# Patient Record
Sex: Female | Born: 1940 | Race: Black or African American | Hispanic: No | Smoking: Never smoker
Health system: Southern US, Community
[De-identification: ages and names within clinical notes are randomized; demographics above are authoritative.]

## PROBLEM LIST (undated history)

## (undated) DIAGNOSIS — N179 Acute kidney failure, unspecified: Secondary | ICD-10-CM

## (undated) DIAGNOSIS — R079 Chest pain, unspecified: Secondary | ICD-10-CM

## (undated) DIAGNOSIS — I10 Essential (primary) hypertension: Secondary | ICD-10-CM

## (undated) DIAGNOSIS — M1990 Unspecified osteoarthritis, unspecified site: Secondary | ICD-10-CM

## (undated) DIAGNOSIS — K5792 Diverticulitis of intestine, part unspecified, without perforation or abscess without bleeding: Secondary | ICD-10-CM

## (undated) DIAGNOSIS — E785 Hyperlipidemia, unspecified: Secondary | ICD-10-CM

## (undated) DIAGNOSIS — K8050 Calculus of bile duct without cholangitis or cholecystitis without obstruction: Secondary | ICD-10-CM

## (undated) HISTORY — DX: Diverticulitis of intestine, part unspecified, without perforation or abscess without bleeding: K57.92

## (undated) HISTORY — DX: Acute kidney failure, unspecified: N17.9

## (undated) HISTORY — PX: HERNIA REPAIR: SHX51

## (undated) HISTORY — PX: TUBAL LIGATION: SHX77

## (undated) HISTORY — DX: Calculus of bile duct without cholangitis or cholecystitis without obstruction: K80.50

## (undated) HISTORY — DX: Unspecified osteoarthritis, unspecified site: M19.90

## (undated) HISTORY — DX: Hyperlipidemia, unspecified: E78.5

---

## 2002-11-17 ENCOUNTER — Ambulatory Visit (HOSPITAL_COMMUNITY): Admission: RE | Admit: 2002-11-17 | Discharge: 2002-11-17 | Payer: Self-pay | Admitting: Family Medicine

## 2002-12-13 ENCOUNTER — Ambulatory Visit (HOSPITAL_COMMUNITY): Admission: RE | Admit: 2002-12-13 | Discharge: 2002-12-13 | Payer: Self-pay | Admitting: Internal Medicine

## 2002-12-13 ENCOUNTER — Encounter: Payer: Self-pay | Admitting: Internal Medicine

## 2003-05-13 ENCOUNTER — Encounter: Payer: Self-pay | Admitting: Emergency Medicine

## 2003-05-13 ENCOUNTER — Emergency Department (HOSPITAL_COMMUNITY): Admission: EM | Admit: 2003-05-13 | Discharge: 2003-05-13 | Payer: Self-pay | Admitting: Emergency Medicine

## 2004-01-23 ENCOUNTER — Emergency Department (HOSPITAL_COMMUNITY): Admission: EM | Admit: 2004-01-23 | Discharge: 2004-01-23 | Payer: Self-pay | Admitting: Emergency Medicine

## 2007-06-27 ENCOUNTER — Ambulatory Visit (HOSPITAL_COMMUNITY): Admission: RE | Admit: 2007-06-27 | Discharge: 2007-06-27 | Payer: Self-pay | Admitting: Family Medicine

## 2009-04-30 ENCOUNTER — Encounter: Admission: RE | Admit: 2009-04-30 | Discharge: 2009-04-30 | Payer: Self-pay | Admitting: Family Medicine

## 2010-11-16 ENCOUNTER — Emergency Department (HOSPITAL_COMMUNITY)
Admission: EM | Admit: 2010-11-16 | Discharge: 2010-11-16 | Payer: Self-pay | Source: Home / Self Care | Admitting: Emergency Medicine

## 2010-11-16 ENCOUNTER — Encounter (INDEPENDENT_AMBULATORY_CARE_PROVIDER_SITE_OTHER): Payer: Self-pay | Admitting: *Deleted

## 2010-11-18 ENCOUNTER — Encounter (INDEPENDENT_AMBULATORY_CARE_PROVIDER_SITE_OTHER): Payer: Self-pay | Admitting: *Deleted

## 2010-11-18 ENCOUNTER — Telehealth: Payer: Self-pay | Admitting: Gastroenterology

## 2010-11-19 ENCOUNTER — Ambulatory Visit
Admission: RE | Admit: 2010-11-19 | Discharge: 2010-11-19 | Payer: Self-pay | Source: Home / Self Care | Attending: Gastroenterology | Admitting: Gastroenterology

## 2010-12-18 NOTE — Assessment & Plan Note (Signed)
History of Present Illness Visit Type: new patient  Primary GI MD: Rob Bunting MD Primary Provider: Altamese Branford, MD  Requesting Provider: na Chief Complaint: Upper abd pain  History of Present Illness:     very pleasant 70 year old woman who is here with her daughter today. was referred by ER, with tremendous pains in her stomach.  She really does not recall it very well.  She was told she was constipated. She drank the mag citrate they recommended with good results.  She felt better.    She began vomitting 2 days prior to ER visit.  This lasted about 2 days.  The pain started started after the vomiting.  The vomiting went away.  She has not been eating well.  the emergency room evaluation included CT scan, lab tests, urinalysis. She had a mildly elevated white blood cell count but otherwise her labs were all normal. And suggested fluid in her esophagus and a large amount of stool in her right colon as well as some stool in her distal small bowel. She was nonobstructed.  No fevers or chills with this.    Currently she doesn't feel "well" but pain is gone.  Her last oral pain med was yesterday.    she had a colonoscopy with Dr. Loreta Ave 3 years ago.  She thinks polyps were removed.  Her daughter tells me had constipation at that time.  she does have a BM everyday, but smaller.  no sick contacts.  She            Current Medications (verified): 1)  Hydrocodone-Acetaminophen 5-325 Mg Tabs (Hydrocodone-Acetaminophen) .... One By Mouth Every Six Hours As Needed For Pain 2)  Zofran 4 Mg Tabs (Ondansetron Hcl) .... As Needed 3)  Prilosec Otc 20 Mg Tbec (Omeprazole Magnesium) .... One Tablet By Mouth Once Daily 4)  Calcium 500 Mg Tabs (Calcium) .... One Tablet By By Mouth Two Times A Day 5)  Fish Oil 1000 Mg Caps (Omega-3 Fatty Acids) .... One By Mouth Two Times A Day  Allergies (verified): 1)  ! Codeine  Past History:  Past Medical History: arthritis Colon polyps, Dr. Loreta Ave  2009  Past Surgical History: none  Family History: mother had colon cancer  Social History: she is widowed, she has 2 children, she works as a Arboriculturist, she does not smoke cigarettes or drink alcohol  Review of Systems       Pertinent positive and negative review of systems were noted in the above HPI and GI specific review of systems.  All other review of systems was otherwise negative.   Vital Signs:  Patient profile:   70 year old female Height:      62 inches Weight:      148 pounds BMI:     27.17 BSA:     1.68 Pulse rate:   64 / minute Pulse rhythm:   regular BP sitting:   120 / 74  (left arm) Cuff size:   regular  Vitals Entered By: Ok Anis CMA (November 19, 2010 9:58 AM)  Physical Exam  Additional Exam:  Constitutional: generally well appearing Psychiatric: alert and oriented times 3 Eyes: extraocular movements intact Mouth: oropharynx moist, no lesions Neck: supple, no lymphadenopathy Cardiovascular: heart regular rate and rythm Lungs: CTA bilaterally Abdomen: soft, non-tender, non-distended, no obvious ascites, no peritoneal signs, normal bowel sounds Extremities: no lower extremity edema bilaterally Skin: no lesions on visible extremities    Impression & Recommendations:  Problem # 1:  Symptomatic constipation she had a very large amount stool in her colon on the CT scan that was done 2-3 days ago. She also had fluid back up in her esophagus around the time of a vomiting illness. Still me today that she has absolutely no dysphasia as I don't think that there is truly esophageal pathology here. I recommended she start taking MiraLax on a daily basis to try to prevent significant constipation the future. She knows to call here if she has further troubles. She has been seen by another gastroenterologist to 3 years ago and we will get those records sent over for review so that we may put her in proper surveillance recall.  Patient Instructions: 1)  We will  get colonoscopy reports sent over from Dr. Kenna Gilbert office, pathology as well. Will put you in our reminder system here for your next colonoscopy. 2)  Start miralax powder (OTC), one dose every day. 3)  You need to call Dr. Christella Hartigan' office if the pains return. 4)  The medication list was reviewed and reconciled.  All changed / newly prescribed medications were explained.  A complete medication list was provided to the patient / caregiver.

## 2010-12-18 NOTE — Progress Notes (Signed)
Summary: Triage  Phone Note Call from Patient Call back at [phone]   Caller: Patients daughter Call For: Dr. Christella Hartigan Reason for Call: Talk to Nurse Summary of Call: Pts daughter is calling to get patient an appt, she was seen in ER last night for severe abdominal pain and was told to see a GI for an EGD or Colon, scheduled first available NP3 with Christella Hartigan in feb. but daughter also wants to speak with a nurse Initial call taken by: [place] Johnson,  November 18, 2010 8:19 AM  Follow-up for Phone Call        left message on machine to call back Chales Abrahams CMA Duncan Dull)  November 18, 2010 9:06 AM   Additional Follow-up for Phone Call Additional follow up Details #1::        pt daughter returned call and was given appt with Dr Christella Hartigan on 11/19/10. Additional Follow-up by: Chales Abrahams CMA (AAMA),  November 18, 2010 1:14 PM

## 2011-01-26 LAB — URINALYSIS, ROUTINE W REFLEX MICROSCOPIC
Nitrite: NEGATIVE
Specific Gravity, Urine: 1.02 (ref 1.005–1.030)
pH: 8 (ref 5.0–8.0)

## 2011-01-26 LAB — CBC
Hemoglobin: 12 g/dL (ref 12.0–15.0)
RBC: 4.38 MIL/uL (ref 3.87–5.11)

## 2011-01-26 LAB — COMPREHENSIVE METABOLIC PANEL
ALT: 20 U/L (ref 0–35)
AST: 30 U/L (ref 0–37)
CO2: 27 mEq/L (ref 19–32)
Calcium: 9.3 mg/dL (ref 8.4–10.5)
Chloride: 101 mEq/L (ref 96–112)
GFR calc Af Amer: >60 mL/min (ref >60)
GFR calc non Af Amer: >60 mL/min (ref >60)
Sodium: 139 mEq/L (ref 135–145)
Total Bilirubin: 0.5 mg/dL (ref 0.3–1.2)

## 2011-01-26 LAB — DIFFERENTIAL
Eosinophils Absolute: 0 10*3/uL (ref 0.0–0.7)
Eosinophils Relative: 0 % (ref 0–5)
Lymphs Abs: 1.1 10*3/uL (ref 0.7–4.0)
Monocytes Absolute: 0.6 10*3/uL (ref 0.1–1.0)

## 2011-01-26 LAB — LIPASE, BLOOD: Lipase: 21 U/L (ref 11–59)

## 2011-08-17 HISTORY — PX: VENTRAL HERNIA REPAIR: SHX424

## 2011-08-17 HISTORY — PX: CHOLECYSTECTOMY: SHX55

## 2011-09-03 ENCOUNTER — Inpatient Hospital Stay (HOSPITAL_COMMUNITY)
Admission: EM | Admit: 2011-09-03 | Discharge: 2011-09-12 | DRG: 493 | Disposition: A | Discharge status: Home / Self Care | Payer: BC Managed Care – PPO | Attending: General Surgery | Admitting: General Surgery

## 2011-09-03 ENCOUNTER — Emergency Department (HOSPITAL_COMMUNITY): Payer: BC Managed Care – PPO

## 2011-09-03 DIAGNOSIS — IMO0002 Reserved for concepts with insufficient information to code with codable children: Secondary | ICD-10-CM | POA: Diagnosis not present

## 2011-09-03 DIAGNOSIS — K420 Umbilical hernia with obstruction, without gangrene: Secondary | ICD-10-CM | POA: Diagnosis not present

## 2011-09-03 DIAGNOSIS — N390 Urinary tract infection, site not specified: Secondary | ICD-10-CM | POA: Diagnosis present

## 2011-09-03 DIAGNOSIS — K810 Acute cholecystitis: Principal | ICD-10-CM | POA: Diagnosis present

## 2011-09-03 DIAGNOSIS — K811 Chronic cholecystitis: Secondary | ICD-10-CM

## 2011-09-03 DIAGNOSIS — R112 Nausea with vomiting, unspecified: Secondary | ICD-10-CM | POA: Diagnosis not present

## 2011-09-03 DIAGNOSIS — Y836 Removal of other organ (partial) (total) as the cause of abnormal reaction of the patient, or of later complication, without mention of misadventure at the time of the procedure: Secondary | ICD-10-CM | POA: Diagnosis not present

## 2011-09-03 LAB — URINE MICROSCOPIC-ADD ON

## 2011-09-03 LAB — COMPREHENSIVE METABOLIC PANEL
AST: 24 U/L (ref 0–37)
Albumin: 3.8 g/dL (ref 3.5–5.2)
Alkaline Phosphatase: 106 U/L (ref 39–117)
Chloride: 98 mEq/L (ref 96–112)
Potassium: 3.7 mEq/L (ref 3.5–5.1)
Sodium: 135 mEq/L (ref 135–145)
Total Bilirubin: 0.3 mg/dL (ref 0.3–1.2)
Total Protein: 8 g/dL (ref 6.0–8.3)

## 2011-09-03 LAB — DIFFERENTIAL
Basophils Absolute: 0 10*3/uL (ref 0.0–0.1)
Basophils Relative: 0 % (ref 0–1)
Eosinophils Absolute: 0 10*3/uL (ref 0.0–0.7)
Eosinophils Relative: 0 % (ref 0–5)
Lymphocytes Relative: 8 % — ABNORMAL LOW (ref 12–46)
Monocytes Absolute: 0.7 10*3/uL (ref 0.1–1.0)

## 2011-09-03 LAB — LIPASE, BLOOD: Lipase: 46 U/L (ref 11–59)

## 2011-09-03 LAB — CBC
HCT: 40 % (ref 36.0–46.0)
Hemoglobin: 13.5 g/dL (ref 12.0–15.0)
MCH: 27.4 pg (ref 26.0–34.0)
MCHC: 33.8 g/dL (ref 30.0–36.0)
MCV: 81.3 fL (ref 78.0–100.0)
Platelets: 330 K/uL (ref 150–400)
RBC: 4.92 MIL/uL (ref 3.87–5.11)
RDW: 15.4 % (ref 11.5–15.5)
WBC: 13.4 K/uL — ABNORMAL HIGH (ref 4.0–10.5)

## 2011-09-03 LAB — URINALYSIS, ROUTINE W REFLEX MICROSCOPIC
Glucose, UA: NEGATIVE mg/dL
Hgb urine dipstick: NEGATIVE
Protein, ur: NEGATIVE mg/dL
pH: 8 (ref 5.0–8.0)

## 2011-09-03 LAB — SURGICAL PCR SCREEN: MRSA, PCR: NEGATIVE

## 2011-09-03 MED ORDER — IOHEXOL 300 MG/ML  SOLN
100.0000 mL | Freq: Once | INTRAMUSCULAR | Status: AC | PRN
  Administered 2011-09-03: 100 mL via INTRAVENOUS

## 2011-09-04 ENCOUNTER — Other Ambulatory Visit (INDEPENDENT_AMBULATORY_CARE_PROVIDER_SITE_OTHER): Payer: Self-pay | Admitting: Surgery

## 2011-09-04 LAB — URINE CULTURE: Colony Count: 65000

## 2011-09-04 LAB — COMPREHENSIVE METABOLIC PANEL
ALT: 15 U/L (ref 0–35)
AST: 13 U/L (ref 0–37)
CO2: 25 mEq/L (ref 19–32)
Calcium: 8.7 mg/dL (ref 8.4–10.5)
Chloride: 105 mEq/L (ref 96–112)
GFR calc Af Amer: >90 mL/min (ref >90)
GFR calc non Af Amer: >90 mL/min (ref >90)
Glucose, Bld: 113 mg/dL — ABNORMAL HIGH (ref 70–99)
Sodium: 137 mEq/L (ref 135–145)
Total Bilirubin: 0.2 mg/dL — ABNORMAL LOW (ref 0.3–1.2)

## 2011-09-04 LAB — CBC
Hemoglobin: 10.8 g/dL — ABNORMAL LOW (ref 12.0–15.0)
MCH: 27.2 pg (ref 26.0–34.0)
MCV: 81.6 fL (ref 78.0–100.0)
Platelets: 265 10*3/uL (ref 150–400)
RBC: 3.97 MIL/uL (ref 3.87–5.11)
WBC: 8.8 10*3/uL (ref 4.0–10.5)

## 2011-09-05 LAB — CBC
MCH: 27.3 pg (ref 26.0–34.0)
MCHC: 32.7 g/dL (ref 30.0–36.0)
Platelets: 323 10*3/uL (ref 150–400)

## 2011-09-05 LAB — COMPREHENSIVE METABOLIC PANEL
ALT: 24 U/L (ref 0–35)
AST: 28 U/L (ref 0–37)
Calcium: 10.1 mg/dL (ref 8.4–10.5)
GFR calc Af Amer: >90 mL/min (ref >90)
Sodium: 140 mEq/L (ref 135–145)
Total Protein: 7.5 g/dL (ref 6.0–8.3)

## 2011-09-05 NOTE — Op Note (Signed)
NAMEDARSI, TIEN NO.:  [id number]  MEDICAL RECORD NO.:  [id number]  LOCATION:  1535                         FACILITY:  [facility]  PHYSICIAN:  Wilmon Arms. Corliss Skains, M.D. DATE OF BIRTH:  Dec 15, 1940  DATE OF PROCEDURE:  09/04/2011 DATE OF DISCHARGE:                              OPERATIVE REPORT   PREOPERATIVE DIAGNOSIS:  Acute cholecystitis.  POSTOPERATIVE DIAGNOSIS:  Acute cholecystitis.  PROCEDURE:  Laparoscopic cholecystectomy.  SURGEON:  Wilmon Arms. Corliss Skains, MD  ANESTHESIA:  General.  INDICATIONS:  This is a 70 year old female in good health, who presents with several days of abdominal pain, nausea, and vomiting.  She was admitted to the hospital after being found to have a thickened gallbladder containing stones.  Her liver function tests were normal. She presents now for cholecystectomy.  DESCRIPTION OF PROCEDURE:  The patient was brought to the operating room and placed in the supine position on the operating room table.  After an adequate level of general anesthesia was obtained, the patient's abdomen was prepped with ChloraPrep and draped in sterile fashion.  Time-out was taken to ensure the proper patient, proper procedure.  We infiltrated the area below the umbilicus with 0.25% Marcaine with epinephrine.  A transverse incision was made.  Dissection was carried down the fascia. We opened the fascia vertically.  We entered the peritoneal cavity bluntly.  A stay suture of 0 Vicryl was placed around the fascial opening.  The Hasson cannula was inserted and secured to stay suture. Pneumoperitoneum was obtained by insufflating CO2, maintaining a maximal pressure of 15 mmHg.  The laparoscope was inserted and the patient was positioned in reverse Trendelenburg, tilted to her left.  An 11-mm port was placed in the subxiphoid position.  Two 5-mm ports were placed in the right upper quadrant.  The gallbladder was very thickened and edematous.  We were able to  grasp this with a clamp and lift it.  We opened the peritoneum around the hilum of gallbladder.  We dissected around the cystic artery and cystic duct.  We were able to obtain a critical view.  The cystic artery was ligated, clipped, and divided. The cystic duct was ligated and clipped distally.  A small opening was created on the cystic duct.  We inserted a Cook cholangiogram catheter through the stab incision.  We tried to thread this into the cystic duct.  However, as we tried to irrigate the cholangiogram catheter with saline, there was a persistent leak.  We tried to milk any type of distal obstruction or stone out of the cystic duct, but despite repeated attempts, we were unable to obtain adequate cholangiogram.  The cholangiogram catheter was removed.  We ligated the cystic duct with a clip as well as Endoloop.  No bleeding or leak was noted.  We then dissected the gallbladder free from the liver.  There was a little bit of spillage of bile, but no stones were noted.  We placed the gallbladder in an Endocatch sac.  We thoroughly irrigated the gallbladder fossa and inspected carefully for hemostasis.  The gallbladder in Endocatch sac was removed through the umbilical port site.  We then inspected it again for hemostasis.  A 19-French  Blake drain was inserted through the subxiphoid position and brought out through the most lateral port in the right upper quadrant.  This was sutured to the skin with 2-0 nylon.  We placed a drain in the subhepatic space in the gallbladder fossa.  This was placed to bulb suction. Pneumoperitoneum was then released as we removed the trocars.  Monocryl 4-0 was used to close the skin incisions.  The pursestring sutures were used to close the umbilical fascia.  Benzoin and Steri-Strips were applied. The patient was extubated and brought to recovery room in stable condition.  All sponge, instrument, needle counts were correct.     Wilmon Arms. Corliss Skains,  M.D.     MKT/MEDQ  D:  09/04/2011  T:  09/04/2011  Job:  [id number]  Electronically Signed by Manus Rudd M.D. on 09/05/2011 03:30:32 PM

## 2011-09-06 ENCOUNTER — Inpatient Hospital Stay (HOSPITAL_COMMUNITY): Payer: BC Managed Care – PPO

## 2011-09-07 ENCOUNTER — Inpatient Hospital Stay (HOSPITAL_COMMUNITY): Payer: BC Managed Care – PPO

## 2011-09-07 DIAGNOSIS — K430 Incisional hernia with obstruction, without gangrene: Secondary | ICD-10-CM

## 2011-09-07 LAB — BASIC METABOLIC PANEL
BUN: 4 mg/dL — ABNORMAL LOW (ref 6–23)
CO2: 27 mEq/L (ref 19–32)
Calcium: 9.1 mg/dL (ref 8.4–10.5)
Chloride: 97 mEq/L (ref 96–112)
Creatinine, Ser: 0.49 mg/dL — ABNORMAL LOW (ref 0.50–1.10)
GFR calc Af Amer: >90 mL/min (ref >90)
GFR calc non Af Amer: >90 mL/min (ref >90)
Glucose, Bld: 121 mg/dL — ABNORMAL HIGH (ref 70–99)
Potassium: 3.9 mEq/L (ref 3.5–5.1)
Sodium: 131 mEq/L — ABNORMAL LOW (ref 135–145)

## 2011-09-07 LAB — CBC
HCT: 37 % (ref 36.0–46.0)
Hemoglobin: 12.1 g/dL (ref 12.0–15.0)
MCH: 26.9 pg (ref 26.0–34.0)
MCHC: 32.7 g/dL (ref 30.0–36.0)

## 2011-09-07 MED ORDER — IOHEXOL 300 MG/ML  SOLN
100.0000 mL | Freq: Once | INTRAMUSCULAR | Status: AC | PRN
  Administered 2011-09-07: 100 mL via INTRAVENOUS

## 2011-09-08 LAB — URINALYSIS, MICROSCOPIC ONLY
Glucose, UA: NEGATIVE mg/dL
Leukocytes, UA: NEGATIVE
Specific Gravity, Urine: 1.01 (ref 1.005–1.030)
Urobilinogen, UA: 0.2 mg/dL (ref 0.0–1.0)

## 2011-09-08 LAB — CBC
HCT: 34.5 % — ABNORMAL LOW (ref 36.0–46.0)
Hemoglobin: 12.2 g/dL (ref 12.0–15.0)
Hemoglobin: 11.5 g/dL — ABNORMAL LOW (ref 12.0–15.0)
MCH: 27.2 pg (ref 26.0–34.0)
MCHC: 33.6 g/dL (ref 30.0–36.0)
MCV: 81 fL (ref 78.0–100.0)
WBC: 9.3 10*3/uL (ref 4.0–10.5)

## 2011-09-08 LAB — DIFFERENTIAL
Basophils Absolute: 0 10*3/uL (ref 0.0–0.1)
Basophils Relative: 0 % (ref 0–1)
Lymphocytes Relative: 6 % — ABNORMAL LOW (ref 12–46)
Neutro Abs: 8.6 10*3/uL — ABNORMAL HIGH (ref 1.7–7.7)
Neutrophils Relative %: 93 % — ABNORMAL HIGH (ref 43–77)

## 2011-09-08 LAB — COMPREHENSIVE METABOLIC PANEL
BUN: 4 mg/dL — ABNORMAL LOW (ref 6–23)
Calcium: 9 mg/dL (ref 8.4–10.5)
Creatinine, Ser: 0.47 mg/dL — ABNORMAL LOW (ref 0.50–1.10)
GFR calc Af Amer: >90 mL/min (ref >90)
Glucose, Bld: 147 mg/dL — ABNORMAL HIGH (ref 70–99)
Sodium: 134 mEq/L — ABNORMAL LOW (ref 135–145)
Total Protein: 6.5 g/dL (ref 6.0–8.3)

## 2011-09-08 LAB — MAGNESIUM: Magnesium: 1.8 mg/dL (ref 1.5–2.5)

## 2011-09-09 NOTE — H&P (Signed)
NAMEHANNIE, Teresa Keller NO.:  [id number]  MEDICAL RECORD NO.:  [id number]  LOCATION:  WLED                         FACILITY:  [facility]  PHYSICIAN:  Wilmon Arms. Corliss Skains, M.D. DATE OF BIRTH:  Jul 28, 1941  DATE OF ADMISSION:  09/03/2011 DATE OF DISCHARGE:                             HISTORY & PHYSICAL   CHIEF COMPLAINT:  Abdominal pain since Sunday.  PRIMARY CARE PHYSICIAN:  Cephas Darby. Daphine Deutscher, MD  The patient reports she has not seen Dr. Daphine Deutscher in years.  BRIEF HISTORY:  The patient is a 70 year old [place] American female, who still works full time.  She reports abdominal pain since Sunday along with nausea and vomiting.  The pain is in the mid abdomen.  It is not necessarily related to oral intake.  It lasted all day Sunday and into the afternoon on Monday, then she started feeling better.  Tuesday, she did fine throughout the day.  The pain returned on Wednesday.  She had problem with recurrent nausea and vomiting.  She says her belly sore from that.  She also reports she really has not eaten.  She has been drinking some soups and liquids, but has not had anything solid to eat since Monday.  The patient reports she had similar pain back in January, which they attributed to constipation.  She has been placed on MiraLax and that problem has resolved.  Her last bowel movement was Tuesday.  She continues to have pain since yesterday and the pain has only been relieved by analgesics here in the emergency room.  PAST MEDICAL HISTORY:  No medical problems.  She does have some arthritis.  PAST SURGICAL HISTORY:  Tubal ligation.  FAMILY HISTORY:  Mother is living at 61 with a history of colon cancer. Father died in his 42s lately of an MI.  Two brothers with hypertension, one sister with hypertension.  SOCIAL HISTORY:  She is widowed.  She works as a Arboriculturist at [employer].  Tobacco none.  Alcohol, none.  Drugs none.  REVIEW OF SYSTEMS:  FEVER:   None.  SKIN:  No changes.  WEIGHT:  Stable, no changes.  CARDIAC:  No chest pain or palpitations.  PULMONARY:  No orthopnea, PND, or dyspnea on exertion.  GI:  Positive for bowel movement, Tuesday.  She normally does not have GERD symptoms.  No diarrhea or constipation, currently on MiraLax.  No blood in her stool. GU:  No trouble voiding.  LOWER EXTREMITIES:  No edema.  No claudication.  She has some problems with arthritis in her knees. PSYCHIATRIC:  No changes.  CURRENT MEDICATIONS: 1. MiraLax 17 g 3 days a week on and 3 days off. 2. Vitamin D plus calcium supplement 1 daily. 3. Fish oil 1 capsule daily.  ALLERGIES:  CODEINE causes nausea.  PHYSICAL EXAMINATION:  GENERAL:  This is a well-nourished and well- developed African American female in no acute distress.  She is currently complaining of pain again and she points to the mid epigastric area. VITAL SIGNS:  Temperature is 98.9, heart rate is 77, blood pressure is 179/72 on admission, her last recorded blood pressure at 8 was 143/74. Respiratory rate on admission is 22 and  saturation 99% on room air. HEAD:  Normocephalic. EARS, NOSE, THROAT, AND MOUTH:  Within normal limits. NECK:  Trachea is in the midline.  There are no bruits or JVD.  Thyroid is nonpalpable. LUNGS:  Respiratory effort is normal.  Chest is clear to auscultation and percussion. CARDIAC:  Normal S1 and S2.  No murmur or rub was heard.  No lifts or heaves.  Pulses are +2 in upper and lower extremities. ABDOMEN:  Bowel sounds are present.  She is not really distended.  She again points to mid epigastric area.  She is slightly tender and she points to the whole upper abdomen, but there is no acute tenderness either the right or left side of upper or lower quadrants. GU/RECTAL:  Deferred. LYMPHATIC:  Lymphadenopathy, none palpated, axillary, cervical, or femoral. NEUROLOGIC:  No focal deficits.  Cranial nerves II through XII are intact. PSYCHIATRIC:  Normal  affect.  LABORATORY DATA:  Lipase is 46, sodium is 135, potassium is 3.7, chloride is 98, CO2 is 26, BUN is 8, creatinine is 0.54, glucose is 114, total bilirubin 0.3, alkaline phosphatase 106, SGOT is 24, and SGPT is 24.  White count is 13.4, hemoglobin is 13.5, hematocrit is 40, and platelets are 330,000.  UA shows 11 to 20 white cells per high-power field.  Urine was cloudy with many bacteria.  Abdominal ultrasound shows less than 1 cm gallstones seen in the neck of the gallbladder.  There is diffuse gallbladder wall thickening with minimal pericholecystic fluid consistent with cholecystitis.  The common bile duct was normal at 5 mm. Liver, pancreas, spleen, kidneys, and aorta were also normal.  CT scan of the abdomen shows liver is unremarkable, although gallbladder is inflamed with wall thickening and pericholecystic edema or fluid.  The gallbladder is suggestive of acute cholecystitis.  The common bile duct was normal in caliber and there were no other acute findings on the CT scan.  Pancreas, spleen, adrenals, kidneys, stomach, duodenum, small bowel, and colon were all unremarkable.  There is some scattered colonic diverticulosis without any acute diverticulitis.  The appendix was normal.  IMPRESSION: 1. Acute cholecystitis, cholelithiasis. 2. Urinary tract infection. 3. CT did show the esophagus is dilated and there is mild distal wall     thickening, which might be due to achalasia.  She does not complain     of any symptoms like this.  PLAN:  We are going to treat her nausea and pain, start her on antibiotic after urine culture is obtained.  We will admit the patient, hydrate her, start her on Cipro and tentatively aim for cholecystectomy in the a.m.  EKG shows sinus arrhythmia with a rate of 80.  PR interval 152 milliseconds, QTC of 438 milliseconds with no ST changes.  She also had a troponin on admission was 0.01.     Eber Hong,  P.A.   ______________________________ Wilmon Arms. Lynnmarie Lovett, M.D.    WDJ/MEDQ  D:  09/03/2011  T:  09/03/2011  Job:  [id number]  cc:   Tanya D. Daphine Deutscher, M.D. Fax: [phone]  Electronically Signed by Sherrie George P.A. on 09/06/2011 03:37:43 PM Electronically Signed by Manus Rudd M.D. on 09/09/2011 04:41:39 AM

## 2011-09-11 LAB — COMPREHENSIVE METABOLIC PANEL
AST: 39 U/L — ABNORMAL HIGH (ref 0–37)
Albumin: 2.7 g/dL — ABNORMAL LOW (ref 3.5–5.2)
BUN: 5 mg/dL — ABNORMAL LOW (ref 6–23)
Chloride: 100 mEq/L (ref 96–112)
Creatinine, Ser: 0.55 mg/dL (ref 0.50–1.10)
Total Bilirubin: 0.3 mg/dL (ref 0.3–1.2)
Total Protein: 6.4 g/dL (ref 6.0–8.3)

## 2011-09-11 LAB — CBC
HCT: 34.2 % — ABNORMAL LOW (ref 36.0–46.0)
MCHC: 33 g/dL (ref 30.0–36.0)
MCV: 81.4 fL (ref 78.0–100.0)
Platelets: 304 10*3/uL (ref 150–400)
RDW: 15.6 % — ABNORMAL HIGH (ref 11.5–15.5)
WBC: 9.1 10*3/uL (ref 4.0–10.5)

## 2011-09-11 LAB — MAGNESIUM: Magnesium: 2 mg/dL (ref 1.5–2.5)

## 2011-09-15 NOTE — Op Note (Signed)
NAMEBRYNNLEIGH, Teresa Keller NO.:  [id number]  MEDICAL RECORD NO.:  [id number]  LOCATION:  1535                         FACILITY:  [facility]  PHYSICIAN:  Sandria Bales. Ezzard Standing, M.D.  DATE OF BIRTH:  [birth year]  DATE OF PROCEDURE:  09/07/2011                              OPERATIVE REPORT   PREOPERATIVE DIAGNOSES: 1. Incarcerated small bowel. 2. Ventral hernia.  POSTOPERATIVE DIAGNOSES: 1. Incarcerated small bowel 2. Ventral hernia/umbilical incision site of previous gallbladder surgery.  PROCEDURE:  Closure of ventral hernia with reduction of small bowel.  SURGEON:  Sandria Bales. Ezzard Standing, M.D.  FIRST ASSISTANT:  None.  ANESTHESIA:  General endotracheal.  ESTIMATED BLOOD LOSS:  Minimal.  INDICATION FOR PROCEDURE:  Teresa Keller is a 70 year old African American female, who is a patient of Dr. Alwyn Pea, though she has not seen her in some time, presented with acute cholecystitis.  She underwent a laparoscopic cholecystectomy by Dr. Manus Rudd on September 04, 2011, but had worsening abdominal pain since the surgery.  A CT scan today suggested a knuckle of small bowel at the umbilical incision site/ventral hernia causing an obstruction.  Dr. Dwain Sarna, who was our surgeon of the week at [facility], spoke to the patient initially.  I reviewed the chart and spoke to the patient and her daughter, Chyrl Civatte.  I discussed the indications and potential complications of surgery.  The potential complications include bleeding, infection, and if she has damaged bowel, resecting that bowel.  OPERATIVE NOTE:  Patient underwent a general endotracheal anesthetic, supervised by Dr. Lucille Passy in room #1.  She had a Jackson-Pratt coming out of the right upper quadrant.  I planned to leave alone during the procedure.  Her abdomen was prepped with ChloraPrep and sterilely draped.    A time-out was held and surgical checklist run.  She was given Unasyn antibiotic.   She had a Foley catheter in place. She was sterilely draped.  I went through her infraumbilical incision which was a transverse incision. I got down and found a knuckle of small bowel of about 3 to 4 cm in diameter.  The fascial defect was about 2 cm diameter.  Under gentle pressure I was able to reduce the small bowel back into the abdominal cavity.  The small bowel looked viable.  I could do a very limited as far as an evaluation through the incision, but I thought we needed to do nothing further on reducing the bowel, making sure it is viable.  I then closed the fascia defect with interrupted 0 Novafil sutures, I used 5 of these sutures.  The fascial sutures closure looked tight.  I irrigated the wound with about 40 cc of saline.  I closed the subcutaneous tissues with the 3-0 Vicryl suture. The skin with a 5-0 Monocryl suture painted with Dermabond.  The patient tolerated procedure well, was transferred to recovery room in good condition.  Her NG tube will be left in overnight with a plan to remove that once her GI tract seems to be opening up.   Sandria Bales. Ezzard Standing, M.D., FACS  DHN/MEDQ  D:  09/07/2011  T:  09/08/2011  Job:  [id number]  cc:  Tanya D. Daphine Deutscher, M.D. Fax: [phone]  Electronically Signed by Ovidio Kin M.D. on 09/15/2011 12:36:34 PM

## 2011-09-19 NOTE — Discharge Summary (Signed)
NAMEJANIQUE, Teresa Keller NO.:  [id number]  MEDICAL RECORD NO.:  [id number]  LOCATION:  1535                         FACILITY:  [facility]  PHYSICIAN:  Sandria Bales. Ezzard Standing, M.D.  DATE OF BIRTH:  Jun 18, 1941  DATE OF ADMISSION:  09/03/2011 DATE OF DISCHARGE:  09/12/2011                              DISCHARGE SUMMARY   ADMITTING PHYSICIAN:  Wilmon Arms. Corliss Skains, MD  DISCHARGING PHYSICIAN:  Sandria Bales. Ezzard Standing, MD  CONSULTANTS:  None.  PROCEDURES: 1. Laparoscopic cholecystectomy by Dr. Manus Rudd on September 04, 2011. 2. Closure of ventral hernia with reduction of small bowel by Dr.     Ovidio Kin on September 07, 2011.  REASON FOR ADMISSION:  Teresa Keller is a 70 year old black female who began having abdominal pains along with nausea and vomiting since Sunday prior to admission.  She reported having similar pains back in early January, but thought it was secondary to constipation.  She was started on MiraLax and that pain resolved.  However, it did not this time.  She presented to the emergency department secondary to worsening pain.  Upon arrival, she had an ultrasound completed that revealed a 1 cm gallstone in the neck of the gallbladder with diffuse gallbladder wall thickening and pericholecystic fluid.  Because of this, a surgical admission was felt necessary.  Please see admitting history and physical for further details.  ADMITTING DIAGNOSES: 1. Acute cholecystitis and cholelithiasis. 2. Possible urinary tract infection.  HOSPITAL COURSE:  This time the patient was admitted.  She was taken to the operating room the following day, where she underwent a laparoscopic cholecystectomy.  A cholangiogram was unable to be obtained.  The patient tolerated the procedure well.  On postoperative day 1, the patient was tolerating liquids and her diet was advanced.  She did have a drain placed with serosanguineous output.  She was encouraged to mobilize.  Early the next  morning on postoperative day 2, the patient began having an increase in nausea and vomiting.  She was not passing any flatus or having any bowel movements.  An abdominal x-ray was obtained, which revealed some distended small bowel loops with air-fluid levels with contrast and gas in the colon.  She was made NPO except for ice chips and followed.  Her emesis persisted the following day.    An NG tube was placed and a CT scan was obtained, which revealed an incarcerated hernia as the source of her obstruction at her umbilical site.  Therefore, the patient was taken emergently back to the operating room on postoperative day #3 by Dr. Algis Downs. Alwin Lanigan, where she underwent an umbilical hernia repair.  Postoperatively from this, the patient did much better.  Her NG tube was continued for several days.  Her JP drain was taken out on postoperative day 2/5.  She was passing flatus by postoperative day 3/6 and she was started on a clear liquid diet.  Over the next several days, her diet was advanced as tolerated.    By postoperative day 5/8, the patient was feeling much better.  She was able to tolerate solid food and felt stable for discharge home.  Her abdomen was soft with  active bowel sounds and all of her wounds were clean, dry, and intact.  DISCHARGE DIAGNOSES: 1. Acute cholecystitis. 2. Urinary tract infection. 3. Status post laparoscopic cholecystectomy. 4. Incarcerated umbilical hernia. 5. Status post umbilical hernia repair.  DISCHARGE MEDICATIONS:  The patient is given a prescription for; 1. Percocet 5/325 one to two p.o. q.4 hours p.r.n., pain. 2. She is to continue her allergy eyedrop. 3. Calcium carbonate. 4. Vitamin D. 5. MiraLax. 6. Omega-3 acid.  DISCHARGE INSTRUCTIONS:  The patient may increase her activity slowly. She may shower.  She is not to do any heavy lifting for the next 4 weeks.  She may resume a regular diet.  She is to return to the [facility] on September 22, 2011,  at 2:10 p.m.   Teresa Keller, [place]  Sandria Bales. Ezzard Standing, M.D., FACS    KEO/MEDQ  D:  09/16/2011  T:  09/16/2011  Job:  [id number]  cc:   Wilmon Arms. Corliss Skains, M.D. [address] [place] [address] [place] [address] [facility] [place]  Electronically Signed by Barnetta Chapel PA on 09/17/2011 04:10:47 PM Electronically Signed by Ovidio Kin M.D. on 09/19/2011 06:15:09 AM

## 2011-09-22 ENCOUNTER — Ambulatory Visit (INDEPENDENT_AMBULATORY_CARE_PROVIDER_SITE_OTHER): Payer: BC Managed Care – PPO | Admitting: General Surgery

## 2011-09-22 ENCOUNTER — Encounter (INDEPENDENT_AMBULATORY_CARE_PROVIDER_SITE_OTHER): Payer: Self-pay

## 2011-09-22 ENCOUNTER — Encounter (INDEPENDENT_AMBULATORY_CARE_PROVIDER_SITE_OTHER): Payer: Self-pay | Admitting: General Surgery

## 2011-09-22 VITALS — BP 116/78 | HR 80 | Temp 97.8°F | Resp 18 | Ht 62.0 in | Wt 147.0 lb

## 2011-09-22 DIAGNOSIS — K436 Other and unspecified ventral hernia with obstruction, without gangrene: Secondary | ICD-10-CM

## 2011-09-22 DIAGNOSIS — K811 Chronic cholecystitis: Secondary | ICD-10-CM

## 2011-09-22 NOTE — Progress Notes (Addendum)
Teresa Keller 1941/04/13 [id number] 09/22/2011   Teresa Keller is a 70 y.o. female who had a laparoscopic cholecystectomy 09-04-11  by Dr. Corliss Skains.  The pathology report confirmed chronic Cholecystitis.  The patient then developed an incarcerated Ventral hernia, at the site of the umbilical trocar site.  This a  required repair by Dr. Ezzard Standing on 09/07/11. The patient reports that they are feeling well with normal bowel movements and good appetite.  The pre-operative symptoms of abdominal pain, nausea, and vomiting have resolved. She is back to a normal diet.   Physical examination - Incisions appear well-healed with no sign of infection or bleeding.   Abdomen - soft, non-tender  Impression:  s/p laparoscopic cholecystectomy  Plan:  She may continue aregular diet and full activity.  She may follow-up on a PRN basis. She may return   to work on 09/28/11.

## 2011-09-22 NOTE — Patient Instructions (Signed)
Call if you have any further problems.  Limit weight to 15 pounds another 2 weeks.And

## 2011-09-24 NOTE — Progress Notes (Addendum)
Please edit this note.  The first line is nonsensical.  ? Dragon error.    Edit made 09/30/11 Will Marlyne Beards

## 2011-09-28 NOTE — Progress Notes (Deleted)
Please edit note!!! Read history and correct.

## 2012-08-18 ENCOUNTER — Emergency Department (HOSPITAL_COMMUNITY): Payer: BC Managed Care – PPO

## 2012-08-18 ENCOUNTER — Encounter (HOSPITAL_COMMUNITY): Payer: Self-pay | Admitting: *Deleted

## 2012-08-18 ENCOUNTER — Emergency Department (HOSPITAL_COMMUNITY)
Admission: EM | Admit: 2012-08-18 | Discharge: 2012-08-18 | Disposition: A | Discharge status: Home / Self Care | Payer: BC Managed Care – PPO | Attending: Emergency Medicine | Admitting: Emergency Medicine

## 2012-08-18 DIAGNOSIS — R112 Nausea with vomiting, unspecified: Secondary | ICD-10-CM | POA: Insufficient documentation

## 2012-08-18 DIAGNOSIS — R1013 Epigastric pain: Secondary | ICD-10-CM

## 2012-08-18 DIAGNOSIS — R1110 Vomiting, unspecified: Secondary | ICD-10-CM

## 2012-08-18 DIAGNOSIS — R109 Unspecified abdominal pain: Secondary | ICD-10-CM | POA: Insufficient documentation

## 2012-08-18 LAB — URINE MICROSCOPIC-ADD ON

## 2012-08-18 LAB — CBC
HCT: 36.7 % (ref 36.0–46.0)
Hemoglobin: 12.4 g/dL (ref 12.0–15.0)
MCV: 82.7 fL (ref 78.0–100.0)
RBC: 4.44 MIL/uL (ref 3.87–5.11)
RDW: 15.6 % — ABNORMAL HIGH (ref 11.5–15.5)
WBC: 11.7 10*3/uL — ABNORMAL HIGH (ref 4.0–10.5)

## 2012-08-18 LAB — COMPREHENSIVE METABOLIC PANEL
Albumin: 3.9 g/dL (ref 3.5–5.2)
Alkaline Phosphatase: 122 U/L — ABNORMAL HIGH (ref 39–117)
BUN: 9 mg/dL (ref 6–23)
CO2: 26 mEq/L (ref 19–32)
Chloride: 102 mEq/L (ref 96–112)
Creatinine, Ser: 0.53 mg/dL (ref 0.50–1.10)
GFR calc non Af Amer: >90 mL/min (ref >90)
Glucose, Bld: 114 mg/dL — ABNORMAL HIGH (ref 70–99)
Potassium: 3.5 mEq/L (ref 3.5–5.1)
Total Bilirubin: 0.2 mg/dL — ABNORMAL LOW (ref 0.3–1.2)

## 2012-08-18 LAB — URINALYSIS, ROUTINE W REFLEX MICROSCOPIC
Bilirubin Urine: NEGATIVE
Ketones, ur: NEGATIVE mg/dL
Nitrite: NEGATIVE
Protein, ur: NEGATIVE mg/dL
pH: 7.5 (ref 5.0–8.0)

## 2012-08-18 LAB — LIPASE, BLOOD: Lipase: 26 U/L (ref 11–59)

## 2012-08-18 MED ORDER — MORPHINE SULFATE 4 MG/ML IJ SOLN
4.0000 mg | Freq: Once | INTRAMUSCULAR | Status: AC
  Administered 2012-08-18: 4 mg via INTRAVENOUS
  Filled 2012-08-18: qty 1

## 2012-08-18 MED ORDER — ONDANSETRON HCL 4 MG/2ML IJ SOLN
4.0000 mg | Freq: Once | INTRAMUSCULAR | Status: AC
  Administered 2012-08-18: 4 mg via INTRAVENOUS
  Filled 2012-08-18: qty 2

## 2012-08-18 MED ORDER — IOHEXOL 300 MG/ML  SOLN
100.0000 mL | Freq: Once | INTRAMUSCULAR | Status: AC | PRN
  Administered 2012-08-18: 100 mL via INTRAVENOUS

## 2012-08-18 MED ORDER — IOHEXOL 300 MG/ML  SOLN
20.0000 mL | INTRAMUSCULAR | Status: AC
  Administered 2012-08-18 (×2): 20 mL via ORAL

## 2012-08-18 MED ORDER — ONDANSETRON HCL 4 MG PO TABS: 4.0000 mg | ORAL_TABLET | Freq: Three times a day (TID) | ORAL | Status: DC | PRN

## 2012-08-18 MED ORDER — OXYCODONE-ACETAMINOPHEN 5-325 MG PO TABS: ORAL_TABLET | ORAL | Status: DC

## 2012-08-18 MED ORDER — SODIUM CHLORIDE 0.9 % IV BOLUS (SEPSIS)
1000.0000 mL | Freq: Once | INTRAVENOUS | Status: AC
  Administered 2012-08-18: 1000 mL via INTRAVENOUS

## 2012-08-18 NOTE — ED Notes (Signed)
Pt discharged.Vital signs stable signs stable and GCS 15.

## 2012-08-18 NOTE — ED Provider Notes (Signed)
History     CSN: [id number]  Arrival date & time 08/18/12  1749   First MD Initiated Contact with Patient 08/18/12 1951      Chief Complaint  Patient presents with  . Abdominal Pain  . Nausea  . Emesis    (Consider location/radiation/quality/duration/timing/severity/associated sxs/prior treatment) HPI  71 y.o. female with no past medical history in no acute distress accompanied by children complaining of 2 episodes of nausea and vomiting today with epigastric pain rated at 8/10. Pt states the pain started 2 days ago but worsened to today. She denies fever, diarrhea, chest pain, shortness of breath, dysuria or frequency. Patient has surgically removed gallbladder but appendix is still in place.  Past Medical History  Diagnosis Date  . Arthritis     Past Surgical History  Procedure Date  . Tubal ligation   . Cholecystectomy october 2012    No family history on file.  History  Substance Use Topics  . Smoking status: Never Smoker   . Smokeless tobacco: Never Used  . Alcohol Use: No    OB History    Grav Para Term Preterm Abortions TAB SAB Ect Mult Living                  Review of Systems  Constitutional: Negative for fever.  Respiratory: Negative for shortness of breath.   Cardiovascular: Negative for chest pain.  Gastrointestinal: Positive for nausea, vomiting and abdominal pain. Negative for diarrhea.  All other systems reviewed and are negative.    Allergies  Codeine  Home Medications  No current outpatient prescriptions on file.  BP 145/58  Pulse 87  Temp 98.5 F (36.9 C) (Oral)  Resp 22  SpO2 100%  Physical Exam  Nursing note and vitals reviewed. Constitutional: She is oriented to person, place, and time. She appears well-developed and well-nourished. No distress.  HENT:  Head: Normocephalic and atraumatic.  Right Ear: External ear normal.  Mouth/Throat: Oropharynx is clear and moist.  Eyes: Conjunctivae normal and EOM are normal. Pupils  are equal, round, and reactive to light.  Cardiovascular: Normal rate, regular rhythm and normal heart sounds.   Pulmonary/Chest: Effort normal and breath sounds normal. No stridor.  Abdominal: Soft. Bowel sounds are normal. She exhibits no distension and no mass. There is no tenderness. There is no rebound and no guarding.       Slightly tender to deep palpation of the epigastrium. No peritoneal sign  Musculoskeletal: Normal range of motion.  Neurological: She is alert and oriented to person, place, and time.  Psychiatric: She has a normal mood and affect.    ED Course  Procedures (including critical care time)  Labs Reviewed  CBC - Abnormal; Notable for the following:    WBC 11.7 (*)     RDW 15.6 (*)     All other components within normal limits  COMPREHENSIVE METABOLIC PANEL - Abnormal; Notable for the following:    Glucose, Bld 114 (*)     AST 49 (*)     Alkaline Phosphatase 122 (*)     Total Bilirubin 0.2 (*)     All other components within normal limits  URINALYSIS, ROUTINE W REFLEX MICROSCOPIC - Abnormal; Notable for the following:    Leukocytes, UA SMALL (*)     All other components within normal limits  URINE MICROSCOPIC-ADD ON - Abnormal; Notable for the following:    Squamous Epithelial / LPF FEW (*)     All other components within normal limits  LIPASE, BLOOD  LACTIC ACID, PLASMA   Ct Abdomen Pelvis W Contrast  08/18/2012  *RADIOLOGY REPORT*  Clinical Data: Epigastric pain  CT ABDOMEN AND PELVIS WITH CONTRAST  Technique:  Multidetector CT imaging of the abdomen and pelvis was performed following the standard protocol during bolus administration of intravenous contrast.  Contrast: OMNIPAQUE IOHEXOL 300 MG/ML  SOLN  Comparison: 09/07/2011  Findings: Mild left lung base opacity is favored to reflect scarring or atelectasis.  There is mild right lower lobe bronchiectasis.  Heart size within normal limits.  Dilated distal esophagus with wall thickening and small hiatal  hernia.  A small hypodensity within segment two is unchanged.  There is minimal intrahepatic biliary ductal prominence.  No extrahepatic biliary ductal dilatation.  Status post cholecystectomy.  Unremarkable spleen, pancreas, adrenal glands.  Symmetric renal enhancement.  No hydronephrosis or hydroureter.  No bowel obstruction.  Colonic diverticulosis.  No CT evidence for diverticulitis.  Normal appendix.  No free intraperitoneal air or fluid.  No lymphadenopathy.  There is scattered atherosclerotic calcification of the aorta and its branches. No aneurysmal dilatation.  Thin-walled bladder.  Nonspecific appearance to the uterus.  No adnexal mass.  Pubic symphysis degenerative changes.  Lumbosacral degenerative changes.  No acute osseous finding.  IMPRESSION: Mild intrahepatic biliary ductal prominence is nonspecific status post cholecystectomy.  Correlate with LFTs and ERCP if warranted.  Colonic diverticulosis.  No CT evidence for diverticulitis.  Small hiatal hernia.  Distal esophageal wall thickening may reflect esophagitis or gastroesophageal reflux disease.   Original Report Authenticated By: Waneta Martins, M.D.      1. Abdominal pain, epigastric       MDM  71 year old female otherwise healthy complaining of severe epigastric abdominal pain with nausea and vomiting worsening over the course of several days.   Abdominal exam is benign with no peritoneal signs and there is slight tenderness to palpation of the epigastrium.  9:26 PM Pt is tolerating PO and pain is 3/10. Abdominal exam remains benign  9:45 pain is increasing sharply to 7/10 I think it is reasonable to obtain a CT scan to further elucidate the cause of his pain.  11:28 PM CT shows no acute processes. I will d/c pt with nausea and pain control. Strict return precautions given, Pt will be instructed to return for any worsening pain, fever, vomiting. Pt and family voiced their understanding.    Pt verbalized understanding and  agrees with care plan. Outpatient follow-up and return precautions given.    New Prescriptions   ONDANSETRON (ZOFRAN) 4 MG TABLET    Take 1 tablet (4 mg total) by mouth every 8 (eight) hours as needed for nausea.   OXYCODONE-ACETAMINOPHEN (PERCOCET/ROXICET) 5-325 MG PER TABLET    1 to 2 tabs PO q6hrs  PRN for pain    Wynetta Emery, PA-C 08/18/12 2338

## 2012-08-18 NOTE — ED Notes (Signed)
Patient present to ED with n/v and abdominal pain.  Patient states that she started having the symptoms about the middle of the week but then they went away and she started again today with n/v/d and generalized pain on her abdomen

## 2012-08-18 NOTE — ED Notes (Signed)
Patient transported to CT 

## 2012-08-18 NOTE — ED Notes (Signed)
Pt in bathroom getting urine sample

## 2012-08-18 NOTE — ED Notes (Signed)
Notified CT that pt is done with oral contrast.  

## 2012-08-18 NOTE — ED Notes (Signed)
Lab in with pt 

## 2012-08-18 NOTE — ED Notes (Signed)
Pt notified that we need urine sample.

## 2012-08-18 NOTE — ED Notes (Addendum)
Pt presents with c/c of abdominal pain that started today around 4pm, pain is a constant, sharp pain.  Pt has been vomiting, no diarrhea.  No SOB, no chest pain.  Last BM was today and normal.  Vomiting seems to make the pain better, nothing makes it worse.

## 2012-08-18 NOTE — ED Notes (Addendum)
Pt again notified that we need urine sample, pt st's she'll try in a little bit.  Pt has call bell.

## 2012-08-21 ENCOUNTER — Inpatient Hospital Stay (HOSPITAL_COMMUNITY)
Admission: EM | Admit: 2012-08-21 | Discharge: 2012-08-23 | DRG: 208 | Disposition: A | Discharge status: Home / Self Care | Payer: BC Managed Care – PPO | Attending: Internal Medicine | Admitting: Internal Medicine

## 2012-08-21 ENCOUNTER — Emergency Department (HOSPITAL_COMMUNITY): Payer: BC Managed Care – PPO

## 2012-08-21 ENCOUNTER — Encounter (HOSPITAL_COMMUNITY): Payer: Self-pay | Admitting: *Deleted

## 2012-08-21 DIAGNOSIS — Z79899 Other long term (current) drug therapy: Secondary | ICD-10-CM

## 2012-08-21 DIAGNOSIS — K319 Disease of stomach and duodenum, unspecified: Secondary | ICD-10-CM | POA: Diagnosis present

## 2012-08-21 DIAGNOSIS — M129 Arthropathy, unspecified: Secondary | ICD-10-CM | POA: Diagnosis present

## 2012-08-21 DIAGNOSIS — R7989 Other specified abnormal findings of blood chemistry: Secondary | ICD-10-CM | POA: Diagnosis present

## 2012-08-21 DIAGNOSIS — K8050 Calculus of bile duct without cholangitis or cholecystitis without obstruction: Principal | ICD-10-CM | POA: Diagnosis present

## 2012-08-21 DIAGNOSIS — R109 Unspecified abdominal pain: Secondary | ICD-10-CM | POA: Diagnosis present

## 2012-08-21 DIAGNOSIS — Z23 Encounter for immunization: Secondary | ICD-10-CM

## 2012-08-21 DIAGNOSIS — R112 Nausea with vomiting, unspecified: Secondary | ICD-10-CM | POA: Diagnosis present

## 2012-08-21 DIAGNOSIS — R030 Elevated blood-pressure reading, without diagnosis of hypertension: Secondary | ICD-10-CM | POA: Diagnosis present

## 2012-08-21 DIAGNOSIS — K759 Inflammatory liver disease, unspecified: Secondary | ICD-10-CM

## 2012-08-21 DIAGNOSIS — D132 Benign neoplasm of duodenum: Secondary | ICD-10-CM | POA: Diagnosis present

## 2012-08-21 DIAGNOSIS — D649 Anemia, unspecified: Secondary | ICD-10-CM | POA: Diagnosis present

## 2012-08-21 DIAGNOSIS — R7309 Other abnormal glucose: Secondary | ICD-10-CM | POA: Diagnosis not present

## 2012-08-21 DIAGNOSIS — R011 Cardiac murmur, unspecified: Secondary | ICD-10-CM | POA: Diagnosis present

## 2012-08-21 LAB — CBC WITH DIFFERENTIAL/PLATELET
Basophils Absolute: 0 10*3/uL (ref 0.0–0.1)
Basophils Relative: 0 % (ref 0–1)
Eosinophils Absolute: 0 10*3/uL (ref 0.0–0.7)
Eosinophils Relative: 0 % (ref 0–5)
MCH: 27.9 pg (ref 26.0–34.0)
MCV: 82.9 fL (ref 78.0–100.0)
Platelets: 303 10*3/uL (ref 150–400)
RDW: 15.8 % — ABNORMAL HIGH (ref 11.5–15.5)
WBC: 9 10*3/uL (ref 4.0–10.5)

## 2012-08-21 LAB — COMPREHENSIVE METABOLIC PANEL
ALT: 258 U/L — ABNORMAL HIGH (ref 0–35)
AST: 136 U/L — ABNORMAL HIGH (ref 0–37)
Calcium: 9.3 mg/dL (ref 8.4–10.5)
GFR calc Af Amer: >90 mL/min (ref >90)
Sodium: 138 mEq/L (ref 135–145)
Total Protein: 7.6 g/dL (ref 6.0–8.3)

## 2012-08-21 LAB — URINE MICROSCOPIC-ADD ON

## 2012-08-21 LAB — PROTIME-INR: INR: 1.14 (ref 0.00–1.49)

## 2012-08-21 LAB — TROPONIN I: Troponin I: <0.3 ng/mL (ref <0.30)

## 2012-08-21 LAB — URINALYSIS, ROUTINE W REFLEX MICROSCOPIC
Hgb urine dipstick: NEGATIVE
Nitrite: NEGATIVE
Specific Gravity, Urine: 1.013 (ref 1.005–1.030)
pH: 8.5 — ABNORMAL HIGH (ref 5.0–8.0)

## 2012-08-21 MED ORDER — ONDANSETRON HCL 4 MG/2ML IJ SOLN: 4.0000 mg | Freq: Four times a day (QID) | INTRAMUSCULAR | Status: DC | PRN

## 2012-08-21 MED ORDER — SODIUM CHLORIDE 0.9 % IV SOLN
INTRAVENOUS | Status: DC
  Administered 2012-08-21 – 2012-08-22 (×2): via INTRAVENOUS

## 2012-08-21 MED ORDER — HYDROMORPHONE HCL PF 1 MG/ML IJ SOLN
1.0000 mg | Freq: Once | INTRAMUSCULAR | Status: AC
  Administered 2012-08-21: 1 mg via INTRAVENOUS
  Filled 2012-08-21 (×2): qty 1

## 2012-08-21 MED ORDER — ENOXAPARIN SODIUM 40 MG/0.4ML ~~LOC~~ SOLN
40.0000 mg | SUBCUTANEOUS | Status: DC
  Administered 2012-08-21: 40 mg via SUBCUTANEOUS
  Filled 2012-08-21 (×2): qty 0.4

## 2012-08-21 MED ORDER — MORPHINE SULFATE 2 MG/ML IJ SOLN
2.0000 mg | INTRAMUSCULAR | Status: DC | PRN
  Administered 2012-08-22 (×2): 2 mg via INTRAVENOUS
  Filled 2012-08-21 (×2): qty 1

## 2012-08-21 MED ORDER — INFLUENZA VIRUS VACC SPLIT PF IM SUSP
0.5000 mL | INTRAMUSCULAR | Status: AC
  Administered 2012-08-22: 0.5 mL via INTRAMUSCULAR
  Filled 2012-08-21: qty 0.5

## 2012-08-21 MED ORDER — ONDANSETRON HCL 4 MG PO TABS: 4.0000 mg | ORAL_TABLET | Freq: Four times a day (QID) | ORAL | Status: DC | PRN

## 2012-08-21 MED ORDER — ONDANSETRON HCL 4 MG/2ML IJ SOLN
4.0000 mg | Freq: Once | INTRAMUSCULAR | Status: AC
  Administered 2012-08-21: 4 mg via INTRAVENOUS
  Filled 2012-08-21: qty 2

## 2012-08-21 MED ORDER — SENNA 8.6 MG PO TABS
1.0000 | ORAL_TABLET | Freq: Two times a day (BID) | ORAL | Status: DC
  Filled 2012-08-21 (×3): qty 1

## 2012-08-21 MED ORDER — FENTANYL CITRATE 0.05 MG/ML IJ SOLN
50.0000 ug | Freq: Once | INTRAMUSCULAR | Status: AC
  Administered 2012-08-21: 50 ug via INTRAVENOUS
  Filled 2012-08-21: qty 2

## 2012-08-21 MED ORDER — PNEUMOCOCCAL VAC POLYVALENT 25 MCG/0.5ML IJ INJ
0.5000 mL | INJECTION | INTRAMUSCULAR | Status: AC
  Administered 2012-08-22: 0.5 mL via INTRAMUSCULAR
  Filled 2012-08-21: qty 0.5

## 2012-08-21 MED ORDER — GI COCKTAIL ~~LOC~~
30.0000 mL | Freq: Once | ORAL | Status: AC
  Administered 2012-08-21: 30 mL via ORAL
  Filled 2012-08-21: qty 30

## 2012-08-21 MED ORDER — PANTOPRAZOLE SODIUM 40 MG IV SOLR
40.0000 mg | INTRAVENOUS | Status: DC
  Administered 2012-08-21: 40 mg via INTRAVENOUS
  Filled 2012-08-21 (×2): qty 40

## 2012-08-21 NOTE — ED Notes (Signed)
Dr. Caporossi at the bedside. 

## 2012-08-21 NOTE — ED Provider Notes (Signed)
Medical screening examination/treatment/procedure(s) were conducted as a shared visit with non-physician practitioner(s) and myself.  I personally evaluated the patient during the encounter  Results for orders placed during the hospital encounter of 08/18/12  CBC      Component Value Range   WBC 11.7 (*) 4.0 - 10.5 K/uL   RBC 4.44  3.87 - 5.11 MIL/uL   Hemoglobin 12.4  12.0 - 15.0 g/dL   HCT 40.9  81.1 - 91.4 %   MCV 82.7  78.0 - 100.0 fL   MCH 27.9  26.0 - 34.0 pg   MCHC 33.8  30.0 - 36.0 g/dL   RDW 78.2 (*) 95.6 - 21.3 %   Platelets 289  150 - 400 K/uL  COMPREHENSIVE METABOLIC PANEL      Component Value Range   Sodium 140  135 - 145 mEq/L   Potassium 3.5  3.5 - 5.1 mEq/L   Chloride 102  96 - 112 mEq/L   CO2 26  19 - 32 mEq/L   Glucose, Bld 114 (*) 70 - 99 mg/dL   BUN 9  6 - 23 mg/dL   Creatinine, Ser 0.86  0.50 - 1.10 mg/dL   Calcium 57.8  8.4 - 46.9 mg/dL   Total Protein 7.9  6.0 - 8.3 g/dL   Albumin 3.9  3.5 - 5.2 g/dL   AST 49 (*) 0 - 37 U/L   ALT 29  0 - 35 U/L   Alkaline Phosphatase 122 (*) 39 - 117 U/L   Total Bilirubin 0.2 (*) 0.3 - 1.2 mg/dL   GFR calc non Af Amer >90  >90 mL/min   GFR calc Af Amer >90  >90 mL/min  LIPASE, BLOOD      Component Value Range   Lipase 26  11 - 59 U/L  URINALYSIS, ROUTINE W REFLEX MICROSCOPIC      Component Value Range   Color, Urine YELLOW  YELLOW   APPearance CLEAR  CLEAR   Specific Gravity, Urine 1.015  1.005 - 1.030   pH 7.5  5.0 - 8.0   Glucose, UA NEGATIVE  NEGATIVE mg/dL   Hgb urine dipstick NEGATIVE  NEGATIVE   Bilirubin Urine NEGATIVE  NEGATIVE   Ketones, ur NEGATIVE  NEGATIVE mg/dL   Protein, ur NEGATIVE  NEGATIVE mg/dL   Urobilinogen, UA 1.0  0.0 - 1.0 mg/dL   Nitrite NEGATIVE  NEGATIVE   Leukocytes, UA SMALL (*) NEGATIVE  LACTIC ACID, PLASMA      Component Value Range   Lactic Acid, Venous 1.8  0.5 - 2.2 mmol/L  URINE MICROSCOPIC-ADD ON      Component Value Range   Squamous Epithelial / LPF FEW (*) RARE   WBC,  UA 3-6  <3 WBC/hpf   RBC / HPF 0-2  <3 RBC/hpf   Bacteria, UA RARE  RARE   Ct Abdomen Pelvis W Contrast  08/18/2012  *RADIOLOGY REPORT*  Clinical Data: Epigastric pain  CT ABDOMEN AND PELVIS WITH CONTRAST  Technique:  Multidetector CT imaging of the abdomen and pelvis was performed following the standard protocol during bolus administration of intravenous contrast.  Contrast: OMNIPAQUE IOHEXOL 300 MG/ML  SOLN  Comparison: 09/07/2011  Findings: Mild left lung base opacity is favored to reflect scarring or atelectasis.  There is mild right lower lobe bronchiectasis.  Heart size within normal limits.  Dilated distal esophagus with wall thickening and small hiatal hernia.  A small hypodensity within segment two is unchanged.  There is minimal intrahepatic biliary ductal prominence.  No extrahepatic biliary ductal dilatation.  Status post cholecystectomy.  Unremarkable spleen, pancreas, adrenal glands.  Symmetric renal enhancement.  No hydronephrosis or hydroureter.  No bowel obstruction.  Colonic diverticulosis.  No CT evidence for diverticulitis.  Normal appendix.  No free intraperitoneal air or fluid.  No lymphadenopathy.  There is scattered atherosclerotic calcification of the aorta and its branches. No aneurysmal dilatation.  Thin-walled bladder.  Nonspecific appearance to the uterus.  No adnexal mass.  Pubic symphysis degenerative changes.  Lumbosacral degenerative changes.  No acute osseous finding.  IMPRESSION: Mild intrahepatic biliary ductal prominence is nonspecific status post cholecystectomy.  Correlate with LFTs and ERCP if warranted.  Colonic diverticulosis.  No CT evidence for diverticulitis.  Small hiatal hernia.  Distal esophageal wall thickening may reflect esophagitis or gastroesophageal reflux disease.   Original Report Authenticated By: Waneta Martins, M.D.    Dg Abd Acute W/chest  08/21/2012  *RADIOLOGY REPORT*  Clinical Data: Abdominal pain and vomiting for 3 days.  Rule out  small bowel obstruction.  ACUTE ABDOMEN SERIES (ABDOMEN 2 VIEW & CHEST 1 VIEW)  Comparison: CT 08/18/2012 and 09/06/2011  Findings: Cardiomediastinal silhouette is within normal limits. The lungs are free of focal consolidations and pleural effusions. There is a small amount of atelectasis at both lung bases.  Supine and erect views of the abdomen show a nonobstructive bowel gas pattern.  Surgical clips are identified in the right upper quadrant of the abdomen.  Scoliosis of the thoracolumbar spine, convex left.  There is sclerosis of the symphysis pubis.  IMPRESSION:  1.  Minimal bibasilar atelectasis. 2.  Nonobstructive bowel gas pattern.   Original Report Authenticated By: Patterson Hammersmith, M.D.     Patient with CT without significant findings, can be discharged with return precautions.  Shelda Jakes, MD 08/21/12 2156

## 2012-08-21 NOTE — H&P (Signed)
Hospital Admission Note Date: 08/21/2012  Patient name: Teresa Keller Medical record number: [id number] Date of birth: 06/12/1941 Age: 71 y.o. Gender: female PCP: DEFAULT,PROVIDER, MD  Medical Service:  Attending physician:  Tacey Heap   1st Contact: Dr. Garald Braver   Pager:[phone] 2nd Contact: Dr. Loistine Chance   [phone] After 5 pm or weekends: 1st Contact:      Pager: [phone] 2nd Contact:      Pager: [phone]  Chief Complaint: Abdominal pain, nausea and vomiting  History of Present Illness: [name] is a 71 yo woman with no known PMH s/p cholecystectomy one year ago who presents to the [facility] ED for evaluation of abdominal pain, nausea, and vomiting. She was seen in the ED on 08/18/12 for the same symptoms. CT abdomen at that time and labs were benign and she was sent home with prescription for Zofran and Percocet. The pain has persisted being present for 5 days and it is described as a constant ache, in the epigastric region, 8/10, and it radiates to her right shoulder. She reports having this exact pain one year ago, prior to her cholecystectomy. Her pain has caused nausea and vomiting for 5 days, she has ben unable to keep any intake per mouth. She took Ibuprofen, maximum dose of 1200mg  per day with no relief of her pain.   She was seen in the ED on 08/18/12 for the same symptoms. CT abdomen at that time concerning for intrahepatic biliary duct predominance but normal LFTs and she was sent home with prescription for Zofran and Percocet.   She denies shortness of breath, fever, chills, chest pain, bloody emesis, constipation, diarrhea, dysuria, or blood in her stools.   Meds: Current Outpatient Rx  Name Route Sig Dispense Refill  . ADULT MULTIVITAMIN W/MINERALS CH Oral Take 1 tablet by mouth daily.    . OMEGA-3-ACID ETHYL ESTERS 1 G PO CAPS Oral Take 1 g by mouth daily.    Marland Kitchen ONDANSETRON HCL 4 MG PO TABS Oral Take 1 tablet (4 mg total) by mouth every 8 (eight) hours as needed for nausea. 6  tablet 0  . OXYCODONE-ACETAMINOPHEN 5-325 MG PO TABS  1 to 2 tabs PO q6hrs  PRN for pain 10 tablet 0    Allergies: Allergies as of 08/21/2012 - Review Complete 08/21/2012  Allergen Reaction Noted  . Codeine     Past Medical History  Diagnosis Date  . Arthritis    Past Surgical History  Procedure Date  . Tubal ligation   . Cholecystectomy october 2012   History reviewed. No pertinent family history. History   Social History  . Marital Status: Widowed    Spouse Name: N/A    Number of Children: N/A  . Years of Education: N/A   Occupational History  . Not on file.   Social History Main Topics  . Smoking status: Never Smoker   . Smokeless tobacco: Never Used  . Alcohol Use: No  . Drug Use: No  . Sexually Active:    Other Topics Concern  . Not on file   Social History Narrative  . No narrative on file    Review of Systems: Pertinent items are noted in HPI.  Physical Exam: Blood pressure 147/67, pulse 79, temperature 98.8 F (37.1 C), temperature source Oral, resp. rate 18, SpO2 98.00%. BP 147/67  Pulse 79  Temp 98.8 F (37.1 C) (Oral)  Resp 18  SpO2 98%  General Appearance:    Alert, cooperative, no distress, appears stated age  Head:  Normocephalic, without obvious abnormality, atraumatic  Eyes:    PERRL, conjunctiva/corneas clear, EOM's intact.     Nose:   Nares normal, septum midline, mucosa normal, no drainage    or sinus tenderness  Throat:   Lips, mucosa, normal; oral thrush,  teeth and gums normal     Back:     Symmetric, no curvature, ROM normal, no CVA tenderness  Lungs:     Clear to auscultation bilaterally, respirations unlabored  Chest Wall:    No tenderness or deformity   Heart:    Regular rate and rhythm, S1 and S2 normal, no murmur, rub   or gallop     Abdomen:     Soft, mid epigastric tenderness, no guarding or rebound tenderness, bowel sounds active all four quadrants,  no masses, no organomegaly        Extremities:   Extremities  normal, atraumatic, no cyanosis or edema  Pulses:   2+ and symmetric all extremities  Skin:   Skin color, texture, turgor normal, no rashes or lesions  Lymph nodes:   Cervical, supraclavicular, and axillary nodes normal  Neurologic:   CNII-XII intact, no neurologic deficit    Lab results: Basic Metabolic Panel:  Basename 08/21/12 1114  NA 138  K 3.7  CL 101  CO2 26  GLUCOSE 105*  BUN 5*  CREATININE 0.54  CALCIUM 9.3  MG --  PHOS --   Liver Function Tests:  Basename 08/21/12 1114  AST 136*  ALT 258*  ALKPHOS 229*  BILITOT 3.4*  PROT 7.6  ALBUMIN 3.6    Basename 08/21/12 1114 08/18/12 2011  LIPASE 39 26  AMYLASE -- --   CBC:  Basename 08/21/12 1114  WBC 9.0  NEUTROABS 7.0  HGB 12.6  HCT 37.4  MCV 82.9  PLT 303   Cardiac Enzymes:  Basename 08/21/12 1759  CKTOTAL --  CKMB --  CKMBINDEX --  TROPONINI <0.30   Coagulation:  Basename 08/21/12 1758  LABPROT 14.4  INR 1.14   Urine Drug Screen: Urinalysis:  Basename 08/21/12 1444 08/18/12 2135  COLORURINE AMBER* YELLOW  LABSPEC 1.013 1.015  PHURINE 8.5* 7.5  GLUCOSEU NEGATIVE NEGATIVE  HGBUR NEGATIVE NEGATIVE  BILIRUBINUR MODERATE* NEGATIVE  KETONESUR 40* NEGATIVE  PROTEINUR NEGATIVE NEGATIVE  UROBILINOGEN 0.2 1.0  NITRITE NEGATIVE NEGATIVE  LEUKOCYTESUR TRACE* SMALL*    Imaging results:  Dg Abd Acute W/chest  08/21/2012  *RADIOLOGY REPORT*  Clinical Data: Abdominal pain and vomiting for 3 days.  Rule out small bowel obstruction.  ACUTE ABDOMEN SERIES (ABDOMEN 2 VIEW & CHEST 1 VIEW)  Comparison: CT 08/18/2012 and 09/06/2011  Findings: Cardiomediastinal silhouette is within normal limits. The lungs are free of focal consolidations and pleural effusions. There is a small amount of atelectasis at both lung bases.  Supine and erect views of the abdomen show a nonobstructive bowel gas pattern.  Surgical clips are identified in the right upper quadrant of the abdomen.  Scoliosis of the thoracolumbar  spine, convex left.  There is sclerosis of the symphysis pubis.  IMPRESSION:  1.  Minimal bibasilar atelectasis. 2.  Nonobstructive bowel gas pattern.   Original Report Authenticated By: Patterson Hammersmith, M.D.     Other results: EKG:  08/21/12 Normal axis, sinus rhythm, no ST changes  Assessment & Plan by Problem: 71 yo woman with no hx of EthOH, s/p cholecystectomy, with persistent epigastric pain, N/V and now elevated LFTs.   Abdominal pain. This has persisted and is concerning for biliary involvement, including obstruction as  her Alk phos is elevated. Her LTFs are also elevated with t bili at 3.4 from 0.2. Patient reports complications after her lap cholecystectomy which required a second surgery. Her lipase is normal and she does not have an elevated WBC. Acute abdomen X-ray with no pattern for obstruction.  -Admit to Med Surg -GI consult for possible ERCP -CMP in AM -NPO -IVF @ 135ml/hr -morphine 2mg  q4h PRN for pain -Senna for constipation prophylaxis with pain medicine regimen.   Nausea and Vomiting. Likely secondary to above. Per patient, N/V started after the pain. She has been unable to tolerate any PO intake. Her electrolytes are stable although she has ketones of 40 in her urine c/w decreased PO intake.  -NPO -IVF @ 143ml/hr -Zofran PRN for nausea -Protonix IV -BMET in AM   Hypertension. Patient has no history or diagnosis of HTN. She has been hypertensive in the ED but in the context ox pain and IV hydration.  -Will continue monitoring.  -No medications at this time  DVT prophylaxis. Lovenox 40mg  SQ  Signed: Ky Barban 08/21/2012, 6:43 PM

## 2012-08-21 NOTE — ED Notes (Signed)
Phlebotomy at the bedside  

## 2012-08-21 NOTE — ED Notes (Signed)
Patient transported to X-ray 

## 2012-08-21 NOTE — ED Provider Notes (Addendum)
History     CSN: [id number]  Arrival date & time 08/21/12  1034   First MD Initiated Contact with Patient 08/21/12 1505      Chief Complaint  Patient presents with  . Abdominal Pain  . Emesis    (Consider location/radiation/quality/duration/timing/severity/associated sxs/prior treatment) The history is provided by the patient.  66 y female c/o epigastric abd pain and n/v since last Wedn.  No diarrhea.  Pain is constant and radiates to right shoulder. No change with food.  No f/c/sob/cough or uti sxs. No pain anywhere else.  Had cholecystectomy.  No other abd surgery. No hx pud. No etoh use.    Past Medical History  Diagnosis Date  . Arthritis     Past Surgical History  Procedure Date  . Tubal ligation   . Cholecystectomy october 2012    History reviewed. No pertinent family history.  History  Substance Use Topics  . Smoking status: Never Smoker   . Smokeless tobacco: Never Used  . Alcohol Use: No    OB History    Grav Para Term Preterm Abortions TAB SAB Ect Mult Living                  Review of Systems  Constitutional: Negative for fever, chills and diaphoresis.  HENT: Negative for neck pain.   Respiratory: Negative for cough and shortness of breath.   Cardiovascular: Negative for chest pain.  Gastrointestinal: Positive for nausea, vomiting and abdominal pain. Negative for diarrhea.  Genitourinary: Negative for dysuria and hematuria.  Musculoskeletal: Negative for back pain.  Skin: Negative for rash.  Neurological: Negative for headaches.  Hematological: Does not bruise/bleed easily.  Psychiatric/Behavioral: Negative for confusion.  All other systems reviewed and are negative.    Allergies  Codeine  Home Medications   Current Outpatient Rx  Name Route Sig Dispense Refill  . ADULT MULTIVITAMIN W/MINERALS CH Oral Take 1 tablet by mouth daily.    . OMEGA-3-ACID ETHYL ESTERS 1 G PO CAPS Oral Take 1 g by mouth daily.    Marland Kitchen ONDANSETRON HCL 4 MG PO TABS  Oral Take 1 tablet (4 mg total) by mouth every 8 (eight) hours as needed for nausea. 6 tablet 0  . OXYCODONE-ACETAMINOPHEN 5-325 MG PO TABS  1 to 2 tabs PO q6hrs  PRN for pain 10 tablet 0    BP 127/65  Pulse 70  Temp 98.8 F (37.1 C) (Oral)  Resp 14  SpO2 99%  Physical Exam  Nursing note and vitals reviewed. Constitutional: She is oriented to person, place, and time. She appears well-developed and well-nourished. No distress.  HENT:  Head: Normocephalic and atraumatic.  Eyes: Conjunctivae normal and EOM are normal.  Neck: Normal range of motion. Neck supple.  Cardiovascular: Normal rate and intact distal pulses.   No murmur heard. Pulmonary/Chest: Effort normal and breath sounds normal. No respiratory distress.  Abdominal: Soft. Bowel sounds are normal. She exhibits no distension. There is tenderness. There is no rebound and no guarding.       Epigastric tenderness, with no peritoneal signs  Musculoskeletal: Normal range of motion. She exhibits no edema and no tenderness.  Neurological: She is alert and oriented to person, place, and time.  Skin: Skin is warm and dry.  Psychiatric: She has a normal mood and affect. Thought content normal.    ED Course  Procedures (including critical care time) epigastric abdominal pain, radiating to the right flank.  The patient, with history of cholecystectomy.  She was seen  in the emergency department.  A few days ago for this similar symptoms.  Her symptoms have persisted.  We will repeat laboratory testing, and perform an x-ray to look for bowel obstruction.  I will review the CAT scan before deciding if that should be repeated as well.  We'll treat her with IV analgesics, and antiemetics.  Labs Reviewed  CBC WITH DIFFERENTIAL - Abnormal; Notable for the following:    RDW 15.8 (*)     Neutrophils Relative 78 (*)     All other components within normal limits  COMPREHENSIVE METABOLIC PANEL - Abnormal; Notable for the following:    Glucose, Bld  105 (*)     BUN 5 (*)     AST 136 (*)     ALT 258 (*)     Alkaline Phosphatase 229 (*)     Total Bilirubin 3.4 (*)     All other components within normal limits  URINALYSIS, ROUTINE W REFLEX MICROSCOPIC - Abnormal; Notable for the following:    Color, Urine AMBER (*)  BIOCHEMICALS MAY BE AFFECTED BY COLOR   APPearance HAZY (*)     pH 8.5 (*)     Bilirubin Urine MODERATE (*)     Ketones, ur 40 (*)     Leukocytes, UA TRACE (*)     All other components within normal limits  URINE MICROSCOPIC-ADD ON - Abnormal; Notable for the following:    Squamous Epithelial / LPF FEW (*)     All other components within normal limits  LIPASE, BLOOD   No results found.   No diagnosis found.  4:58 PM Still in pain  5:01 PM Spoke with the resident. She will come eval for admission  MDM  abd pain with vomiting.  hepatitis        Cheri Guppy, MD 08/21/12 1623  Cheri Guppy, MD 08/21/12 1659  Cheri Guppy, MD 08/21/12 [phone]

## 2012-08-21 NOTE — ED Notes (Signed)
Pt states that her abd pain has decreased to a 6/10 but is still present.  No distress noted.  Resp symmetrical and unlabored.  Waiting on EDP provider.

## 2012-08-21 NOTE — ED Notes (Signed)
Pt in c/o abd pain and n/v since Tuesday, pt was seen for same on Friday but states symptoms have not improved. Pt c/o continued vomiting.

## 2012-08-21 NOTE — ED Notes (Signed)
Denies pain at the present no distress.

## 2012-08-22 ENCOUNTER — Encounter (HOSPITAL_COMMUNITY): Payer: Self-pay | Admitting: Critical Care Medicine

## 2012-08-22 ENCOUNTER — Encounter (HOSPITAL_COMMUNITY): Payer: Self-pay | Admitting: *Deleted

## 2012-08-22 ENCOUNTER — Encounter (HOSPITAL_COMMUNITY): Admission: EM | Disposition: A | Discharge status: Home / Self Care | Payer: Self-pay | Source: Home / Self Care

## 2012-08-22 ENCOUNTER — Inpatient Hospital Stay (HOSPITAL_COMMUNITY): Payer: BC Managed Care – PPO

## 2012-08-22 ENCOUNTER — Inpatient Hospital Stay (HOSPITAL_COMMUNITY): Payer: BC Managed Care – PPO | Admitting: Critical Care Medicine

## 2012-08-22 DIAGNOSIS — R7989 Other specified abnormal findings of blood chemistry: Secondary | ICD-10-CM

## 2012-08-22 DIAGNOSIS — K319 Disease of stomach and duodenum, unspecified: Secondary | ICD-10-CM

## 2012-08-22 DIAGNOSIS — R109 Unspecified abdominal pain: Secondary | ICD-10-CM

## 2012-08-22 DIAGNOSIS — K8050 Calculus of bile duct without cholangitis or cholecystitis without obstruction: Principal | ICD-10-CM | POA: Diagnosis present

## 2012-08-22 DIAGNOSIS — D132 Benign neoplasm of duodenum: Secondary | ICD-10-CM | POA: Diagnosis present

## 2012-08-22 HISTORY — PX: ERCP: SHX5425

## 2012-08-22 LAB — HEPATIC FUNCTION PANEL
ALT: 248 U/L — ABNORMAL HIGH (ref 0–35)
AST: 150 U/L — ABNORMAL HIGH (ref 0–37)
Albumin: 3.2 g/dL — ABNORMAL LOW (ref 3.5–5.2)
Alkaline Phosphatase: 224 U/L — ABNORMAL HIGH (ref 39–117)
Bilirubin, Direct: 2.9 mg/dL — ABNORMAL HIGH (ref 0.0–0.3)
Total Bilirubin: 3.6 mg/dL — ABNORMAL HIGH (ref 0.3–1.2)

## 2012-08-22 LAB — BASIC METABOLIC PANEL
CO2: 21 mEq/L (ref 19–32)
Chloride: 103 mEq/L (ref 96–112)
Creatinine, Ser: 0.52 mg/dL (ref 0.50–1.10)
GFR calc Af Amer: >90 mL/min (ref >90)
Potassium: 3.7 mEq/L (ref 3.5–5.1)

## 2012-08-22 LAB — CBC
HCT: 35.9 % — ABNORMAL LOW (ref 36.0–46.0)
Hemoglobin: 11.9 g/dL — ABNORMAL LOW (ref 12.0–15.0)
MCV: 83.1 fL (ref 78.0–100.0)
RBC: 4.32 MIL/uL (ref 3.87–5.11)
RDW: 15.9 % — ABNORMAL HIGH (ref 11.5–15.5)
WBC: 9.3 10*3/uL (ref 4.0–10.5)

## 2012-08-22 LAB — HEMOGLOBIN A1C: Mean Plasma Glucose: 128 mg/dL — ABNORMAL HIGH (ref <117)

## 2012-08-22 SURGERY — ERCP, WITH INTERVENTION IF INDICATED
Anesthesia: General

## 2012-08-22 MED ORDER — LIDOCAINE HCL (CARDIAC) 20 MG/ML IV SOLN
INTRAVENOUS | Status: DC | PRN
  Administered 2012-08-22: 100 mg via INTRAVENOUS

## 2012-08-22 MED ORDER — FENTANYL CITRATE 0.05 MG/ML IJ SOLN
INTRAMUSCULAR | Status: DC | PRN
  Administered 2012-08-22: 100 ug via INTRAVENOUS

## 2012-08-22 MED ORDER — SODIUM CHLORIDE 0.9 % IV SOLN
INTRAVENOUS | Status: DC
  Administered 2012-08-22: 17:00:00 via INTRAVENOUS

## 2012-08-22 MED ORDER — SODIUM CHLORIDE 0.9 % IV SOLN
1.5000 g | Freq: Once | INTRAVENOUS | Status: AC
  Administered 2012-08-22: 1.5 g via INTRAVENOUS
  Filled 2012-08-22: qty 1.5

## 2012-08-22 MED ORDER — MEPERIDINE HCL 25 MG/ML IJ SOLN: 6.2500 mg | INTRAMUSCULAR | Status: DC | PRN

## 2012-08-22 MED ORDER — LACTATED RINGERS IV SOLN
INTRAVENOUS | Status: DC | PRN
  Administered 2012-08-22: 13:00:00 via INTRAVENOUS

## 2012-08-22 MED ORDER — ONDANSETRON HCL 4 MG/2ML IJ SOLN
INTRAMUSCULAR | Status: DC | PRN
  Administered 2012-08-22: 4 mg via INTRAVENOUS

## 2012-08-22 MED ORDER — CIPROFLOXACIN IN D5W 400 MG/200ML IV SOLN
400.0000 mg | Freq: Two times a day (BID) | INTRAVENOUS | Status: DC
  Administered 2012-08-22: 400 mg via INTRAVENOUS
  Filled 2012-08-22 (×2): qty 200

## 2012-08-22 MED ORDER — ROCURONIUM BROMIDE 100 MG/10ML IV SOLN
INTRAVENOUS | Status: DC | PRN
  Administered 2012-08-22: 40 mg via INTRAVENOUS

## 2012-08-22 MED ORDER — OXYCODONE HCL 5 MG/5ML PO SOLN: 5.0000 mg | Freq: Once | ORAL | Status: AC | PRN

## 2012-08-22 MED ORDER — PROPOFOL 10 MG/ML IV BOLUS
INTRAVENOUS | Status: DC | PRN
  Administered 2012-08-22: 150 mg via INTRAVENOUS
  Administered 2012-08-22: 50 mg via INTRAVENOUS

## 2012-08-22 MED ORDER — MIDAZOLAM HCL 5 MG/5ML IJ SOLN
INTRAMUSCULAR | Status: DC | PRN
  Administered 2012-08-22: 1 mg via INTRAVENOUS

## 2012-08-22 MED ORDER — OXYCODONE HCL 5 MG PO TABS
5.0000 mg | ORAL_TABLET | Freq: Once | ORAL | Status: AC | PRN
Start: 2012-08-22 — End: 2012-08-22

## 2012-08-22 MED ORDER — SODIUM CHLORIDE 0.9 % IV SOLN: INTRAVENOUS | Status: DC

## 2012-08-22 MED ORDER — ONDANSETRON HCL 4 MG/2ML IJ SOLN: 4.0000 mg | Freq: Once | INTRAMUSCULAR | Status: AC | PRN

## 2012-08-22 MED ORDER — NEOSTIGMINE METHYLSULFATE 1 MG/ML IJ SOLN
INTRAMUSCULAR | Status: DC | PRN
  Administered 2012-08-22: 5 mg via INTRAVENOUS

## 2012-08-22 MED ORDER — HYDROMORPHONE HCL PF 1 MG/ML IJ SOLN: 0.2500 mg | INTRAMUSCULAR | Status: DC | PRN

## 2012-08-22 MED ORDER — GLYCOPYRROLATE 0.2 MG/ML IJ SOLN
INTRAMUSCULAR | Status: DC | PRN
  Administered 2012-08-22: .8 mg via INTRAVENOUS

## 2012-08-22 MED ORDER — OXYCODONE HCL 5 MG PO TABS
5.0000 mg | ORAL_TABLET | Freq: Four times a day (QID) | ORAL | Status: DC | PRN
  Administered 2012-08-22: 5 mg via ORAL
  Filled 2012-08-22: qty 1

## 2012-08-22 NOTE — Consult Note (Signed)
Chart was reviewed and patient was examined. X-rays were reviewed.    I agree with management and plans.  Suspect retained CBD stone.  Plan ERCP.  The risks, benefits, and possible complications of the procedure, including bleeding, perforation, surgery, and the 5-10% risk for pancreatitis, were explained to the patient.  Patient's questions were answered.   Barbette Hair. Arlyce Dice, M.D., [facility] [phone]

## 2012-08-22 NOTE — Transfer of Care (Signed)
Immediate Anesthesia Transfer of Care Note  Patient: Teresa Keller  Procedure(s) Performed: Procedure(s) (LRB) with comments: ENDOSCOPIC RETROGRADE CHOLANGIOPANCREATOGRAPHY (ERCP) (N/A)  Patient Location: PACU  Anesthesia Type: General  Level of Consciousness: awake and alert   Airway & Oxygen Therapy: Patient Spontanous Breathing and Patient connected to nasal cannula oxygen  Post-op Assessment: Report given to PACU RN, Post -op Vital signs reviewed and stable and Patient moving all extremities X 4  Post vital signs: Reviewed and stable  Complications: No apparent anesthesia complications

## 2012-08-22 NOTE — Consult Note (Signed)
[facility] Consult: 8:47 AM 08/22/2012   Referring Provider: Dr Garald Braver  Primary Care Physician:   Primary Gastroenterologist:  Dr. Christella Hartigan, DR Loreta Ave  Reason for Consultation:  ? Choledocholithiasis.   HPI: Teresa Keller is a 71 y.o. female.  Pt s/p Lap chole in 08/2011. S/p incarcerated ventral hernia repair at trocar insertion site 09/07/11.  In ED 08/18/12 with n/v/epigastric abd pain.  CT showed mild prominence intrahepatic ducts, tics in colon, HH, thickened distal esophagus.  LFTs with Alk phos of 122, ast of 49, otherwise normal.  She went home with Rx for Percocet and advised to return if further problems.  Returned and admitted from ED 10/6 with ongoing sxs.  LFTs had climbed, see below.  Lipase normal.  No additional imaging pursued. The epigastric pain does not radiate. Scores at 10/10 but relieved with Morphine.  Nausea was not bloody.    Past Medical History  Diagnosis Date  . Arthritis     Past Surgical History  Procedure Date  . Tubal ligation   . Cholecystectomy october 2012    Prior to Admission medications   Medication Sig Start Date End Date Taking? Authorizing Provider  Multiple Vitamin (MULTIVITAMIN WITH MINERALS) TABS Take 1 tablet by mouth daily.   Yes Historical Provider, MD  omega-3 acid ethyl esters (LOVAZA) 1 G capsule Take 1 g by mouth daily.   Yes Historical Provider, MD  ondansetron (ZOFRAN) 4 MG tablet Take 1 tablet (4 mg total) by mouth every 8 (eight) hours as needed for nausea. 08/18/12  Yes Nicole Pisciotta, PA-C  oxyCODONE-acetaminophen (PERCOCET/ROXICET) 5-325 MG per tablet 1 to 2 tabs PO q6hrs  PRN for pain 08/18/12  Yes Nicole Pisciotta, PA-C    Scheduled Meds:    . enoxaparin (LOVENOX) injection  40 mg Subcutaneous Q24H  . fentaNYL  50 mcg Intravenous Once  . gi cocktail  30 mL Oral Once  .  HYDROmorphone (DILAUDID) injection  1 mg Intravenous Once  . influenza  inactive virus vaccine  0.5  mL Intramuscular Tomorrow-1000  . ondansetron (ZOFRAN) IV  4 mg Intravenous Once  . pantoprazole (PROTONIX) IV  40 mg Intravenous Q24H  . pneumococcal 23 valent vaccine  0.5 mL Intramuscular Tomorrow-1000  . senna  1 tablet Oral BID   Infusions:    . sodium chloride 125 mL/hr at 08/22/12 0348   PRN Meds: morphine injection, ondansetron (ZOFRAN) IV, ondansetron   Allergies as of 08/21/2012 - Review Complete 08/21/2012  Allergen Reaction Noted  . Codeine      History reviewed. No pertinent family history.  History   Social History  . Marital Status: Widowed    Spouse Name: N/A    Number of Children: N/A  . Years of Education: N/A   Occupational History  . Works as Copy.    Social History Main Topics  . Smoking status: Never Smoker   . Smokeless tobacco: Never Used  . Alcohol Use: None  . Drug Use: No  . Sexually Active: No     REVIEW OF SYSTEMS: Weight stable No headaches or dizzines. No seizure. No balance problems No acute vision problems, wears glasses to read.  Wears upper denture, very few native teeth remain No dysphagia, dyspepsia.  No constipation Moderate, variable arthritic joint pain.  Takes Ibuprofen 600 mg about 3 x a week Urine is tea colored today. No pruritus or itching No sweat or chills or fevers. L shoulder pain at night when she lays down, this is not a new  problem. No unusual bleeding .  No hx of transfusions No chest pressure/pain, no palpitations.  No extremity swelling  PHYSICAL EXAM: Vital signs in last 24 hours: Temp:  [98.5 F (36.9 C)-98.8 F (37.1 C)] 98.6 F (37 C) (10/07 0458) Pulse Rate:  [68-85] 73  (10/07 0458) Resp:  [14-20] 17  (10/07 0458) BP: (122-174)/(57-102) 156/74 mmHg (10/07 0458) SpO2:  [94 %-99 %] 96 % (10/07 0458) Weight:  [153 lb 1.6 oz (69.446 kg)] 153 lb 1.6 oz (69.446 kg) (10/06 1927)  General: well appearin, pleasant AAF.  NAD Head:  No asymmetry or swelling  Eyes:  Slight icterus, no conj  pallor Ears:  Not HOH  Nose:  No discharge or congestion Mouth:  Very few teeth, mucosa pink and clear Neck:  No mass, JVD or bruits Lungs:  Clear.  No dyspnea Heart: RRR.  No MRG Abdomen:  Soft, ND, not obese, no mass or HSM.  Tenderness localizes to epigastrum and is mild.  .   Rectal: not done   Musc/Skeltl: no joint deformity Extremities:  No pedal edema  Neurologic:  Oriented x 3.  Moves all 4 limbs.  No tremor Skin:  No rash, sores Tattoos:  none Nodes:  No cervical adenopathy   Psych:  Pleasant, relaxed, cooperative.   Intake/Output from previous day: 10/06 0701 - 10/07 0700 In: 852.1 [I.V.:852.1] Out: -  Intake/Output this shift:    LAB RESULTS:  Basename 08/22/12 0550 08/21/12 1114  WBC 9.3 9.0  HGB 11.9* 12.6  HCT 35.9* 37.4  PLT 266 303   BMET Lab Results  Component Value Date   NA 137 08/22/2012   NA 138 08/21/2012   NA 140 08/18/2012   K 3.7 08/22/2012   K 3.7 08/21/2012   K 3.5 08/18/2012   CL 103 08/22/2012   CL 101 08/21/2012   CL 102 08/18/2012   CO2 21 08/22/2012   CO2 26 08/21/2012   CO2 26 08/18/2012   GLUCOSE 68* 08/22/2012   GLUCOSE 105* 08/21/2012   GLUCOSE 114* 08/18/2012   BUN 8 08/22/2012   BUN 5* 08/21/2012   BUN 9 08/18/2012   CREATININE 0.52 08/22/2012   CREATININE 0.54 08/21/2012   CREATININE 0.53 08/18/2012   CALCIUM 9.0 08/22/2012   CALCIUM 9.3 08/21/2012   CALCIUM 10.0 08/18/2012   LFT  Basename 08/21/12 1114  PROT 7.6  ALBUMIN 3.6  AST 136*  ALT 258*  ALKPHOS 229*  BILITOT 3.4*  BILIDIR --  IBILI --   PT/INR Lab Results  Component Value Date   INR 1.14 08/21/2012    RADIOLOGY STUDIES: 08/18/12 CT ABDOMEN AND PELVIS WITH CONTRAST  Findings: Mild left lung base opacity is favored to reflect  scarring or atelectasis. There is mild right lower lobe  bronchiectasis. Heart size within normal limits. Dilated distal  esophagus with wall thickening and small hiatal hernia.  A small hypodensity within segment two is unchanged. There is   minimal intrahepatic biliary ductal prominence. No extrahepatic  biliary ductal dilatation. Status post cholecystectomy.  Unremarkable spleen, pancreas, adrenal glands.  Symmetric renal enhancement. No hydronephrosis or hydroureter.  No bowel obstruction. Colonic diverticulosis. No CT evidence for  diverticulitis. Normal appendix. No free intraperitoneal air or  fluid. No lymphadenopathy.  There is scattered atherosclerotic calcification of the aorta and  its branches. No aneurysmal dilatation.  Thin-walled bladder. Nonspecific appearance to the uterus. No  adnexal mass.  Pubic symphysis degenerative changes. Lumbosacral degenerative  changes. No acute osseous finding.  IMPRESSION:  Mild intrahepatic biliary ductal prominence is nonspecific status  post cholecystectomy. Correlate with LFTs and ERCP if warranted.  Colonic diverticulosis. No CT evidence for diverticulitis.  Small hiatal hernia. Distal esophageal wall thickening may reflect  esophagitis or gastroesophageal reflux disease.  Original Report Authenticated By: Waneta Martins, M.D.  Dg Abd Acute W/chest 08/21/2012  *RADIOLOGY REPORT*  Clinical Data: Abdominal pain and vomiting for 3 days.  Rule out small bowel obstruction.  ACUTE ABDOMEN SERIES (ABDOMEN 2 VIEW & CHEST 1 VIEW)  Comparison: CT 08/18/2012 and 09/06/2011  Findings: Cardiomediastinal silhouette is within normal limits. The lungs are free of focal consolidations and pleural effusions. There is a small amount of atelectasis at both lung bases.  Supine and erect views of the abdomen show a nonobstructive bowel gas pattern.  Surgical clips are identified in the right upper quadrant of the abdomen.  Scoliosis of the thoracolumbar spine, convex left.  There is sclerosis of the symphysis pubis.  IMPRESSION:  1.  Minimal bibasilar atelectasis. 2.  Nonobstructive bowel gas pattern.   Original Report Authenticated By: Patterson Hammersmith, M.D.     ENDOSCOPIC  STUDIES: Colonoscopy by Dr Loreta Ave around 2009.   Polyps apparently removed. Report not in Epic.   IMPRESSION: *  Rule out choledocholithiasis.  *  Rule out esophagitis.  *  Apparent hx of colon polyps, has not received a letter to return for colonoscopy, and told she would get this in future.    PLAN: *  ERCP in OR , aiming for today.  So keep her NPO *  Pre-op unasyn ordered.    LOS: 1 day   Jennye Moccasin  08/22/2012, 8:47 AM Pager: [phone]

## 2012-08-22 NOTE — Anesthesia Preprocedure Evaluation (Addendum)
Anesthesia Evaluation  Patient identified by MRN, date of birth, ID band Patient awake    Reviewed: Allergy & Precautions, H&P , NPO status , Patient's Chart, lab work & pertinent test results  Airway Mallampati: II TM Distance: >3 FB Neck ROM: Full    Dental  (+) Dental Advisory Given   Pulmonary          Cardiovascular     Neuro/Psych    GI/Hepatic   Endo/Other    Renal/GU      Musculoskeletal  (+) Arthritis -,   Abdominal   Peds  Hematology   Anesthesia Other Findings   Reproductive/Obstetrics                           Anesthesia Physical Anesthesia Plan  ASA: II  Anesthesia Plan: General   Post-op Pain Management:    Induction: Intravenous  Airway Management Planned: Oral ETT  Additional Equipment:   Intra-op Plan:   Post-operative Plan: Extubation in OR  Informed Consent: I have reviewed the patients History and Physical, chart, labs and discussed the procedure including the risks, benefits and alternatives for the proposed anesthesia with the patient or authorized representative who has indicated his/her understanding and acceptance.   Dental advisory given  Plan Discussed with: Surgeon and CRNA  Anesthesia Plan Comments:        Anesthesia Quick Evaluation

## 2012-08-22 NOTE — Progress Notes (Signed)
Subjective: She reports 0/10 pain today with no N/V. She remains NPO for her ERCP today per GI.   She denies chest pain, shortness of breath, dysuria, or hematuria.  Objective: Vital signs in last 24 hours: Filed Vitals:   08/21/12 1837 08/21/12 1927 08/21/12 2126 08/22/12 0458  BP: 153/77 174/89 160/68 156/74  Pulse: 72 70  73  Temp:  98.5 F (36.9 C)  98.6 F (37 C)  TempSrc:  Oral  Oral  Resp: 16 17  17   Height:  5' 2.4" (1.585 m)    Weight:  153 lb 1.6 oz (69.446 kg)    SpO2: 94% 95%  96%   Weight change:   Intake/Output Summary (Last 24 hours) at 08/22/12 1207 Last data filed at 08/22/12 0240  Gross per 24 hour  Intake 852.08 ml  Output      0 ml  Net 852.08 ml   Vitals reviewed. General: resting in bed, in NAD HEENT: Mild scleral icterus Cardiac: RRR, a 2/6 SEM is heard best at the RUSB.  Pulm: clear to auscultation bilaterally, no wheezes, rales, or rhonchi Abd: soft, nondistended, BS present, epigastric and RUQ tenderness.  Ext: warm and well perfused, no pedal edema Neuro: alert and oriented X3, cranial nerves II-XII grossly intact.  Lab Results: Basic Metabolic Panel:  Lab 08/22/12 7829 08/21/12 1114  NA 137 138  K 3.7 3.7  CL 103 101  CO2 21 26  GLUCOSE 68* 105*  BUN 8 5*  CREATININE 0.52 0.54  CALCIUM 9.0 9.3  MG -- --  PHOS -- --   Liver Function Tests:  Lab 08/22/12 0550 08/21/12 1114  AST 150* 136*  ALT 248* 258*  ALKPHOS 224* 229*  BILITOT 3.6* 3.4*  PROT 6.9 7.6  ALBUMIN 3.2* 3.6    Lab 08/21/12 1114 08/18/12 2011  LIPASE 39 26  AMYLASE -- --   CBC:  Lab 08/22/12 0550 08/21/12 1114  WBC 9.3 9.0  NEUTROABS -- 7.0  HGB 11.9* 12.6  HCT 35.9* 37.4  MCV 83.1 82.9  PLT 266 303   Cardiac Enzymes:  Lab 08/21/12 1759  CKTOTAL --  CKMB --  CKMBINDEX --  TROPONINI <0.30   Coagulation:  Lab 08/21/12 1758  LABPROT 14.4  INR 1.14   Urinalysis:  Lab 08/21/12 1444 08/18/12 2135  COLORURINE AMBER* YELLOW  LABSPEC 1.013  1.015  PHURINE 8.5* 7.5  GLUCOSEU NEGATIVE NEGATIVE  HGBUR NEGATIVE NEGATIVE  BILIRUBINUR MODERATE* NEGATIVE  KETONESUR 40* NEGATIVE  PROTEINUR NEGATIVE NEGATIVE  UROBILINOGEN 0.2 1.0  NITRITE NEGATIVE NEGATIVE  LEUKOCYTESUR TRACE* SMALL*   Micro Results: No results found for this or any previous visit (from the past 240 hour(s)). Studies/Results: US Abdomen Complete  08/22/2012  *RADIOLOGY REPORT*  Clinical Data:  Abdominal pain and increased liver function tests.  COMPLETE ABDOMINAL ULTRASOUND  Comparison:  08/18/2012  Findings:  Gallbladder:  Surgically absent.  Common bile duct:  10 mm in diameter, without directly visualized choledocholithiasis.  Liver:  Mildly echogenic favoring diffuse hepatic steatosis.  IVC:  Appears normal.  Pancreas:  No focal abnormality seen.  Spleen:  Measures 3.7 cm craniocaudad and appears normal.  Right Kidney:  Measures 9.4 cm in length and appears normal.  Left Kidney:  Measures 9.5 cm in length and appears normal.  Abdominal aorta:  No aneurysm identified.  IMPRESSION:  1. Progressive dilation of the common bile duct, raising my suspicion for distal obstruction.  Equivocal punctate density in the vicinity of the ampulla on image 57 of  series 5 of prior CT scan could conceivably represent a small distal CBD stone. 2.  Mildly echogenic liver suggesting mild fatty infiltration.   Original Report Authenticated By: Dellia Cloud, M.D.    Dg Abd Acute W/chest  08/21/2012  *RADIOLOGY REPORT*  Clinical Data: Abdominal pain and vomiting for 3 days.  Rule out small bowel obstruction.  ACUTE ABDOMEN SERIES (ABDOMEN 2 VIEW & CHEST 1 VIEW)  Comparison: CT 08/18/2012 and 09/06/2011  Findings: Cardiomediastinal silhouette is within normal limits. The lungs are free of focal consolidations and pleural effusions. There is a small amount of atelectasis at both lung bases.  Supine and erect views of the abdomen show a nonobstructive bowel gas pattern.  Surgical clips are  identified in the right upper quadrant of the abdomen.  Scoliosis of the thoracolumbar spine, convex left.  There is sclerosis of the symphysis pubis.  IMPRESSION:  1.  Minimal bibasilar atelectasis. 2.  Nonobstructive bowel gas pattern.   Original Report Authenticated By: Patterson Hammersmith, M.D.    Medications: I have reviewed the patient's current medications. Scheduled Meds:   . ciprofloxacin  400 mg Intravenous BID  . enoxaparin (LOVENOX) injection  40 mg Subcutaneous Q24H  . fentaNYL  50 mcg Intravenous Once  . gi cocktail  30 mL Oral Once  .  HYDROmorphone (DILAUDID) injection  1 mg Intravenous Once  . influenza  inactive virus vaccine  0.5 mL Intramuscular Tomorrow-1000  . ondansetron (ZOFRAN) IV  4 mg Intravenous Once  . pantoprazole (PROTONIX) IV  40 mg Intravenous Q24H  . pneumococcal 23 valent vaccine  0.5 mL Intramuscular Tomorrow-1000  . senna  1 tablet Oral BID   Continuous Infusions:   . sodium chloride 125 mL/hr at 08/22/12 0348   PRN Meds:.morphine injection, ondansetron (ZOFRAN) IV, ondansetron Assessment/Plan: 71 yo woman with no hx of EthOH, s/p cholecystectomy, with persistent epigastric pain, N/V and now elevated LFTs.   Abdominal pain. Improved with IV morphine. This has persisted and is concerning for biliary involvement, including obstruction as her Alk phos is elevated. Her LTFs are also elevated with t bili at 3.4 from 0.2 on 08/18/12. Patient reports complications after her lap cholecystectomy with bowel incarceration and hernia repair a few days after her first surgery. Her lipase is normal and she does not have an elevated WBC. Acute abdomen X-ray with no pattern for obstruction. LFTs still elevated with direct bilirubin elevated at 2.9, consistent with biliary obstruction.  -GI consulted, appreciate recommendations. Patient to undergo ERCP today.  -CMP in AM -NPO  -IVF @ 124ml/hr  -morphine 2mg  q4h PRN for pain  -Senna for constipation prophylaxis with  pain medicine regimen.   Nausea and Vomiting. Now resolved. Likely secondary to above. Per patient, N/V started after the pain. She had been unable to tolerate any PO intake prior to admission. Her electrolytes are stable although she has ketones of 40 in her urine c/w decreased PO intake.  -NPO  -IVF @ 126ml/hr  -Zofran PRN for nausea  -Protonix IV  -BMET in AM   Hypertension. Patient has no history or diagnosis of HTN. She has been hypertensive in the ED but in the context ox pain and IV hydration.  -Will continue monitoring.  -No medications at this time   DVT prophylaxis. Lovenox 40mg  SQ   LOS: 1 day   Ky Barban 08/22/2012, 12:07 PM

## 2012-08-22 NOTE — H&P (Signed)
Internal Medicine teaching Service Attending Dr.Hadriel Northup. I have personally examined the patient and reviewed the h and P documented by the Resident. In brief  Chief complaint: Abd pain/N/V HOPI: as documented by the resident 9 point review of system as documented in the Resident note. Social history admitting medication family history past surgical history allergies reviewed. Physical examination Notable for: vitals stable no tenderness on abdominal palpation after pain emdicaiton Labs are significant for : increasing multiple folds of her ASt ALT and alk phos  Imaging is significant for: dilated intra hepatic bile ducts EKG:no new changes A and P: FOr ERCP today Rest work up depending on ERCP results. Continue supportive care.

## 2012-08-22 NOTE — Preoperative (Signed)
Beta Blockers   Reason not to administer Beta Blockers:Not Applicable 

## 2012-08-22 NOTE — Progress Notes (Signed)
See ERCP note. Bile duct stones were removed following sphincterotomy. Also noted was a large duodenal polyp. Biopsies were taken.  May advance diet as tolerated. Await biopsy results.

## 2012-08-22 NOTE — Progress Notes (Signed)
Medical Student Daily Progress Note   Subjective:    Interval Events:  No acute events overnight.  Patient reports 0/10 abdominal pain after receiving pain meds.  Has not vomited today.    Objective:    Vital Signs:   Temp:  [98.5 F (36.9 C)-98.6 F (37 C)] 98.6 F (37 C) (10/07 0458) Pulse Rate:  [68-79] 73  (10/07 0458) Resp:  [14-18] 17  (10/07 0458) BP: (122-174)/(57-89) 156/74 mmHg (10/07 0458) SpO2:  [94 %-99 %] 96 % (10/07 0458) Weight:  [69.446 kg (153 lb 1.6 oz)] 69.446 kg (153 lb 1.6 oz) (10/06 1927) Last BM Date: 08/21/12   Weights: 24-hour Weight change:   Filed Weights   08/21/12 1927  Weight: 69.446 kg (153 lb 1.6 oz)     Intake/Output:   Intake/Output Summary (Last 24 hours) at 08/22/12 1126 Last data filed at 08/22/12 0240  Gross per 24 hour  Intake 852.08 ml  Output      0 ml  Net 852.08 ml       Physical Exam: General: Lying in bed in NAD. HEENT: EOMI. PERRLA. No scleral icterus. Neck is supple with no cervical lymphadenopathy. CV: RRR. No m/r/g. Resp: Normal work of breathing.  CTAB. Abd: Soft. Tender to palpation in epigastric region.  No masses appreciated.  Active bowel sounds. Ext: DP and PT pulses 2+ bilaterally. No edema.  Warm and well-perfused. Neuro: A&Ox3. No weakness or sensory deficits. CN II-XII intact bilaterally.      Labs: Basic Metabolic Panel:  Lab 08/22/12 4098 08/21/12 1114 08/18/12 1813  NA 137 138 140  K 3.7 3.7 3.5  CL 103 101 102  CO2 21 26 26   GLUCOSE 68* 105* 114*  BUN 8 5* 9  CREATININE 0.52 0.54 0.53  CALCIUM 9.0 9.3 10.0  MG -- -- --  PHOS -- -- --    Liver Function Tests:  Lab 08/22/12 0550 08/21/12 1114 08/18/12 1813  AST 150* 136* 49*  ALT 248* 258* 29  ALKPHOS 224* 229* 122*  BILITOT 3.6* 3.4* 0.2*  PROT 6.9 7.6 7.9  ALBUMIN 3.2* 3.6 3.9    Lab 08/21/12 1114 08/18/12 2011  LIPASE 39 26  AMYLASE -- --    CBC:  Lab 08/22/12 0550 08/21/12 1114 08/18/12 1813  WBC 9.3 9.0 11.7*    NEUTROABS -- 7.0 --  HGB 11.9* 12.6 12.4  HCT 35.9* 37.4 36.7  MCV 83.1 82.9 82.7  PLT 266 303 289    Cardiac Enzymes:  Lab 08/21/12 1759  CKTOTAL --  CKMB --  CKMBINDEX --  TROPONINI <0.30    Coagulation Studies:  Basename 08/21/12 1758  LABPROT 14.4  INR 1.14    Other results: EKG Results: Rate:  76 PR:  148 QRS:  74 QTc:  427 EKG: Normal sinus rhythm.  Imaging: US Abdomen Complete  08/22/2012  *RADIOLOGY REPORT*  Clinical Data:  Abdominal pain and increased liver function tests.  COMPLETE ABDOMINAL ULTRASOUND  Comparison:  08/18/2012  Findings:  Gallbladder:  Surgically absent.  Common bile duct:  10 mm in diameter, without directly visualized choledocholithiasis.  Liver:  Mildly echogenic favoring diffuse hepatic steatosis.  IVC:  Appears normal.  Pancreas:  No focal abnormality seen.  Spleen:  Measures 3.7 cm craniocaudad and appears normal.  Right Kidney:  Measures 9.4 cm in length and appears normal.  Left Kidney:  Measures 9.5 cm in length and appears normal.  Abdominal aorta:  No aneurysm identified.  IMPRESSION:  1. Progressive dilation of  the common bile duct, raising my suspicion for distal obstruction.  Equivocal punctate density in the vicinity of the ampulla on image 57 of series 5 of prior CT scan could conceivably represent a small distal CBD stone. 2.  Mildly echogenic liver suggesting mild fatty infiltration.   Original Report Authenticated By: Dellia Cloud, M.D.    Dg Abd Acute W/chest  08/21/2012  *RADIOLOGY REPORT*  Clinical Data: Abdominal pain and vomiting for 3 days.  Rule out small bowel obstruction.  ACUTE ABDOMEN SERIES (ABDOMEN 2 VIEW & CHEST 1 VIEW)  Comparison: CT 08/18/2012 and 09/06/2011  Findings: Cardiomediastinal silhouette is within normal limits. The lungs are free of focal consolidations and pleural effusions. There is a small amount of atelectasis at both lung bases.  Supine and erect views of the abdomen show a nonobstructive  bowel gas pattern.  Surgical clips are identified in the right upper quadrant of the abdomen.  Scoliosis of the thoracolumbar spine, convex left.  There is sclerosis of the symphysis pubis.  IMPRESSION:  1.  Minimal bibasilar atelectasis. 2.  Nonobstructive bowel gas pattern.   Original Report Authenticated By: Patterson Hammersmith, M.D.       Medications:    Infusions:    . sodium chloride 125 mL/hr at 08/22/12 0348     Scheduled Medications:    . enoxaparin (LOVENOX) injection  40 mg Subcutaneous Q24H  . fentaNYL  50 mcg Intravenous Once  . gi cocktail  30 mL Oral Once  .  HYDROmorphone (DILAUDID) injection  1 mg Intravenous Once  . influenza  inactive virus vaccine  0.5 mL Intramuscular Tomorrow-1000  . ondansetron (ZOFRAN) IV  4 mg Intravenous Once  . pantoprazole (PROTONIX) IV  40 mg Intravenous Q24H  . pneumococcal 23 valent vaccine  0.5 mL Intramuscular Tomorrow-1000  . senna  1 tablet Oral BID     PRN Medications: morphine injection, ondansetron (ZOFRAN) IV, ondansetron   Assessment/ Plan:    Teresa Keller is a 71 yo AA woman with a PMH significant for acute cholecystitis with cholelithiasis s/p cholecystectomy (08/2011)  with persistent abdominal pain, N/V, and elevated LFTs.  #Abdominal pain   [name] presented with a 5-day history of "achy" epigastric pain that was a 12/10 at admission but is now a 0/10 with pain meds.  She reports that the pain feels the same as it did prior to her cholecystectomy last year. LFTs are elevated (AST 136 ALT 258) and alk phos is elevated (224). Of note Tbili went from 0.2-->3.4 since her last ED visit on 10/3.  Differential includes biliary obstruction (choledocholithiasis v. benign stricture v. malignancy) and cholangitis, though patient is afebrile and has a normal white count (9.3).  CT abdomen shows biliary ductal prominence and abdominal US shows progressive dilation of common bile duct. Abdominal Xray shows no SBO. Lipase is normal.  GI  recommends ERCP to r/o choledocholithiasis and esophagitis. --per GI: ERCP, possibly today.  --NPO for ERCP --cipro 400 mg IV BID --morphine 2mg  q4h PRN pain --senna for constipation ppx      #N/V Likely secondary to a biliary etiology as described above. Denies N/V this morning.  Emesis was non-bloody over the weekend.  She has not been able to eat since Saturday, but when she tried to eat on Sunday she immediately vomited.  --Zofran prn nausea  --IVF @ 135ml/hr --NPO  #Hypertension Patient was hypertensive in the ED but does not have a diagnosis of HTN.  BPs have been 120s-150s over  50s-80s. --Continue to monitor, but will not medicate at this time.   #PPX --DVT: enoxaparin 40 mg IV once daily  --GI: pantoprazole 40 mg IV    This is a Psychologist, occupational Note.  The care of the patient was discussed with Dr. Garald Braver and the assessment and plan formulated with their assistance.  Please see their attached note or addendum for official documentation of the daily encounter.

## 2012-08-22 NOTE — Anesthesia Postprocedure Evaluation (Signed)
  Anesthesia Post-op Note  Patient: Teresa Keller  Procedure(s) Performed: Procedure(s) (LRB) with comments: ENDOSCOPIC RETROGRADE CHOLANGIOPANCREATOGRAPHY (ERCP) (N/A)  Patient Location: PACU  Anesthesia Type: General  Level of Consciousness: awake, alert  and oriented  Airway and Oxygen Therapy: Patient Spontanous Breathing  Post-op Pain: none  Post-op Assessment: Post-op Vital signs reviewed and Patient's Cardiovascular Status Stable  Post-op Vital Signs: stable  Complications: No apparent anesthesia complications

## 2012-08-22 NOTE — Op Note (Signed)
[facility] [address] [place], [address]   ERCP PROCEDURE REPORT  PATIENT: Teresa Keller, Teresa Keller.  MR# :[id number] BIRTHDATE: 02/21/1941  GENDER: Female ENDOSCOPIST: Louis Meckel, MD REFERRED BY: PROCEDURE DATE:  08/22/2012 PROCEDURE:   ERCP with biopsy, ERCP with sphincterotomy/papillotomy, and ERCP with removal of calculus/calculi ASA CLASS:   Class II INDICATIONS:suspected or rule out bile duct stones. MEDICATIONS: MAC sedation, administered by CRNA TOPICAL ANESTHETIC:  DESCRIPTION OF PROCEDURE:   After the risks benefits and alternatives of the procedure were thoroughly explained, informed consent was obtained.  The ED-[id number] ([id number])  endoscope was introduced through the mouth  and advanced to the third portion of the duodenum .  1.  A polyp measuring at least 2-1/2-3 cm was noted just superior to the papilla.  Multiple biopsies were taken. 2.  A single stone was seen. 3.  The common bile duct was cannulated with a 0.35 mm guidewire. Injection demonstrated at least one filling defect in the very distal duct.  A 12-15 mm sphincterotomy was made.  A 12 mm balloon stone extractor was pulled through the sphincterotomized papilla on multiple occasions.  One stone measuring 4-5 mm was seen in the duodenum.  Final cholangiogram appeared normal. 4.  The common bile duct was cannulated with a 0.35 mm guidewire. Injection demonstrated at least one filling defect in the very distal duct.  A 12-15 mm sphincterotomy was made.  A 12 mm balloon stone extractor was pulled through the sphincterotomized papilla on multiple occasions.  One stone measuring 4-5 mm was seen in the duodenum.  Final cholangiogram appeared normal. The scope was then completely withdrawn from the patient and the procedure terminated.     COMPLICATIONS:  ENDOSCOPIC IMPRESSION: 1. bile duct stone-status post sphincterotomy with stone extraction 2. duodenal  polyp . RECOMMENDATIONS: 1.  advance diet 2.  await biopsy results     _______________________________ eSigned:  Louis Meckel, MD 08/22/2012 3:09 PM   CC:

## 2012-08-23 DIAGNOSIS — D649 Anemia, unspecified: Secondary | ICD-10-CM

## 2012-08-23 DIAGNOSIS — I10 Essential (primary) hypertension: Secondary | ICD-10-CM

## 2012-08-23 DIAGNOSIS — D1330 Benign neoplasm of unspecified part of small intestine: Secondary | ICD-10-CM

## 2012-08-23 DIAGNOSIS — R112 Nausea with vomiting, unspecified: Secondary | ICD-10-CM

## 2012-08-23 LAB — CBC
HCT: 32.7 % — ABNORMAL LOW (ref 36.0–46.0)
Hemoglobin: 10.9 g/dL — ABNORMAL LOW (ref 12.0–15.0)
MCH: 27.4 pg (ref 26.0–34.0)
MCHC: 33.3 g/dL (ref 30.0–36.0)
MCV: 82.2 fL (ref 78.0–100.0)
RBC: 3.98 MIL/uL (ref 3.87–5.11)

## 2012-08-23 LAB — IRON AND TIBC
Iron: 35 ug/dL — ABNORMAL LOW (ref 42–135)
Saturation Ratios: 12 % — ABNORMAL LOW (ref 20–55)
TIBC: 293 ug/dL (ref 250–470)

## 2012-08-23 LAB — COMPREHENSIVE METABOLIC PANEL
ALT: 204 U/L — ABNORMAL HIGH (ref 0–35)
BUN: 5 mg/dL — ABNORMAL LOW (ref 6–23)
CO2: 25 mEq/L (ref 19–32)
Calcium: 8.5 mg/dL (ref 8.4–10.5)
Creatinine, Ser: 0.54 mg/dL (ref 0.50–1.10)
GFR calc Af Amer: >90 mL/min (ref >90)
GFR calc non Af Amer: >90 mL/min (ref >90)
Glucose, Bld: 98 mg/dL (ref 70–99)
Sodium: 140 mEq/L (ref 135–145)
Total Protein: 5.8 g/dL — ABNORMAL LOW (ref 6.0–8.3)

## 2012-08-23 LAB — FERRITIN: Ferritin: 88 ng/mL (ref 10–291)

## 2012-08-23 MED ORDER — DOCUSATE SODIUM 100 MG PO CAPS: 100.0000 mg | ORAL_CAPSULE | Freq: Two times a day (BID) | ORAL | Status: DC

## 2012-08-23 NOTE — Discharge Summary (Signed)
. Internal Medicine [facility] Discharge Note  Name: Teresa Keller MRN: [id number] DOB: November 27, 1940 71 y.o.  Date of Admission: 08/21/2012 12:36 PM Date of Discharge: 08/23/2012 Attending Physician: Tacey Heap, MD  Discharge Diagnosis: Active Problems:  Elevated LFTs   Discharge Medications:   Medication List     As of 08/23/2012  2:37 PM    TAKE these medications         docusate sodium 100 MG capsule   Commonly known as: COLACE   Take 1 capsule (100 mg total) by mouth 2 (two) times daily.      multivitamin with minerals Tabs   Take 1 tablet by mouth daily.      omega-3 acid ethyl esters 1 G capsule   Commonly known as: LOVAZA   Take 1 g by mouth daily.      ondansetron 4 MG tablet   Commonly known as: ZOFRAN   Take 1 tablet (4 mg total) by mouth every 8 (eight) hours as needed for nausea.      oxyCODONE-acetaminophen 5-325 MG per tablet   Commonly known as: PERCOCET/ROXICET   1 to 2 tabs PO q6hrs  PRN for pain         Disposition and follow-up:   Teresa Keller was discharged from [facility] in good condition.  At the hospital follow up visit please:  - Reassess her blood pressure which was elevated through out this admission - Further discuss her HbA1C of 6.1% as well as her anemia.  - Her anemia panel is pending at the time of her discharge.  -In addition she has a 2/6 systolic murmur heard at the R sternal border that may need further work up if she becomes symptomatic.  -Per gastroenterology, she does not need follow up at this time. Her ERCP duodenal biopsies results are pending at the time of her discharge.  -Finally, her LFTs were trending down at the time of her discharge but still elevated. Please repeat a CMP.    Follow-up Appointments: Follow-up Information    Follow up with Kristie Cowman, MD. On 08/26/2012. (Follow-up with Dr. Bosie Clos in the Internal Medicine Clinic on 10/11 at 9:45 am.)    Contact  information:   [address] [place] [address] [phone]         Discharge Orders    Future Appointments: Provider: Department: Dept Phone: Center:   08/26/2012 9:45 AM Manuela Schwartz, MD [facility] [phone] [facility]     Future Orders Please Complete By Expires   Increase activity slowly         Consultations: Treatment Team:  Louis Meckel, MD  Procedures Performed:  US Abdomen Complete  08/22/2012  *RADIOLOGY REPORT*  Clinical Data:  Abdominal pain and increased liver function tests.  COMPLETE ABDOMINAL ULTRASOUND  Comparison:  08/18/2012  Findings:  Gallbladder:  Surgically absent.  Common bile duct:  10 mm in diameter, without directly visualized choledocholithiasis.  Liver:  Mildly echogenic favoring diffuse hepatic steatosis.  IVC:  Appears normal.  Pancreas:  No focal abnormality seen.  Spleen:  Measures 3.7 cm craniocaudad and appears normal.  Right Kidney:  Measures 9.4 cm in length and appears normal.  Left Kidney:  Measures 9.5 cm in length and appears normal.  Abdominal aorta:  No aneurysm identified.  IMPRESSION:  1. Progressive dilation of the common bile duct, raising my suspicion for distal obstruction.  Equivocal punctate density in the vicinity of  the ampulla on image 57 of series 5 of prior CT scan could conceivably represent a small distal CBD stone. 2.  Mildly echogenic liver suggesting mild fatty infiltration.   Original Report Authenticated By: Dellia Cloud, M.D.    Ct Abdomen Pelvis W Contrast  08/18/2012  *RADIOLOGY REPORT*  Clinical Data: Epigastric pain  CT ABDOMEN AND PELVIS WITH CONTRAST  Technique:  Multidetector CT imaging of the abdomen and pelvis was performed following the standard protocol during bolus administration of intravenous contrast.  Contrast: OMNIPAQUE IOHEXOL 300 MG/ML  SOLN  Comparison: 09/07/2011  Findings: Mild left lung base opacity is favored to reflect scarring or atelectasis.  There is  mild right lower lobe bronchiectasis.  Heart size within normal limits.  Dilated distal esophagus with wall thickening and small hiatal hernia.  A small hypodensity within segment two is unchanged.  There is minimal intrahepatic biliary ductal prominence.  No extrahepatic biliary ductal dilatation.  Status post cholecystectomy.  Unremarkable spleen, pancreas, adrenal glands.  Symmetric renal enhancement.  No hydronephrosis or hydroureter.  No bowel obstruction.  Colonic diverticulosis.  No CT evidence for diverticulitis.  Normal appendix.  No free intraperitoneal air or fluid.  No lymphadenopathy.  There is scattered atherosclerotic calcification of the aorta and its branches. No aneurysmal dilatation.  Thin-walled bladder.  Nonspecific appearance to the uterus.  No adnexal mass.  Pubic symphysis degenerative changes.  Lumbosacral degenerative changes.  No acute osseous finding.  IMPRESSION: Mild intrahepatic biliary ductal prominence is nonspecific status post cholecystectomy.  Correlate with LFTs and ERCP if warranted.  Colonic diverticulosis.  No CT evidence for diverticulitis.  Small hiatal hernia.  Distal esophageal wall thickening may reflect esophagitis or gastroesophageal reflux disease.   Original Report Authenticated By: Waneta Martins, M.D.    Dg Ercp With Sphincterotomy  08/22/2012  *RADIOLOGY REPORT*  Clinical Data: Common bile duct stones  ERCP with sphincterotomy  Comparison:  CT 08/18/2012, ultrasound 08/22/2012  Technique:  Multiple spot images obtained with the fluoroscopic device and submitted for interpretation post-procedure.  ERCP was performed by Dr. Arlyce Dice.  Findings: Right upper quadrant clips are in place.  On images labeled #4 and #5, there is a mobile distal common duct filling defect.  After balloon sweep, on the last image of the exam, there is no persistent visualized filling defect and contrast excretion into duodenum noted.  No common duct dilatation.  16 images submitted for  evaluation.  IMPRESSION: Transient distal common duct filling defect which could represent retained stone or gas bubble.  No persistent defect on last image of the study (allowing for partial obscuration of the distal common duct).  These images were submitted for radiologic interpretation only. Please see the procedural report for the amount of contrast and the fluoroscopy time utilized.   Original Report Authenticated By: Harrel Lemon, M.D.    Dg Abd Acute W/chest  08/21/2012  *RADIOLOGY REPORT*  Clinical Data: Abdominal pain and vomiting for 3 days.  Rule out small bowel obstruction.  ACUTE ABDOMEN SERIES (ABDOMEN 2 VIEW & CHEST 1 VIEW)  Comparison: CT 08/18/2012 and 09/06/2011  Findings: Cardiomediastinal silhouette is within normal limits. The lungs are free of focal consolidations and pleural effusions. There is a small amount of atelectasis at both lung bases.  Supine and erect views of the abdomen show a nonobstructive bowel gas pattern.  Surgical clips are identified in the right upper quadrant of the abdomen.  Scoliosis of the thoracolumbar spine, convex left.  There is sclerosis  of the symphysis pubis.  IMPRESSION:  1.  Minimal bibasilar atelectasis. 2.  Nonobstructive bowel gas pattern.   Original Report Authenticated By: Patterson Hammersmith, M.D.      Admission HPI:  Ms. Boyers is a 71 yo woman with no known PMH s/p cholecystectomy one year ago who presents to the [facility] ED for evaluation of abdominal pain, nausea, and vomiting. She was seen in the ED on 08/18/12 for the same symptoms. CT abdomen at that time and labs were benign and she was sent home with prescription for Zofran and Percocet. The pain has persisted being present for 5 days and it is described as a constant ache, in the epigastric region, 8/10, and it radiates to her right shoulder. She reports having this exact pain one year ago, prior to her cholecystectomy. Her pain has caused nausea and vomiting for 5 days, she has ben unable  to keep any intake per mouth. She took Ibuprofen, maximum dose of 1200mg  per day with no relief of her pain.  She was seen in the ED on 08/18/12 for the same symptoms. CT abdomen at that time concerning for intrahepatic biliary duct predominance but normal LFTs and she was sent home with prescription for Zofran and Percocet.  She denies shortness of breath, fever, chills, chest pain, bloody emesis, constipation, diarrhea, dysuria, or blood in her stools.   Hospital Course by problem list:  Abdominal pain. Resolved now. This had persisted and was concerning for biliary involvement, including obstruction as her Alk phos is elevated. Her LTFs were elevated with t bili at 3.4 from 0.2 on 08/18/12, fractionated bili at 2.9. Patient reported complications after her lap cholecystectomy on 09/2011 with bowel incarceration and hernia repair a few days after her first surgery. Her lipase remianed normal and she did not have an elevated WBC. Acute abdomen X-ray showed no pattern for obstruction. US abdomen pre-ERCP showed progressive bile duct dilatation and mild fatty infiltration. She is now status post ERCP with stone extraction, sphincterotomy, and biopsies of a newly duodenal polyp. Her LFTs trended down today with her total bilirubin normalized at 1.2. Her pain has subsided and she is tolerating a reduced fat diet well. She will be discharged home with instructions to follow a reduced fat diet.   Nausea and Vomiting. This was thought to be likely secondary to her abdominal pain. Per patient, her nausea and vomiting started after the pain. She had been unable to tolerate any PO intake prior to admission. Her electrolytes have been stable throughout this admission although she had ketones of 40 in her urine at the time of her admission, consistent with decreased PO intake. She was kept NPO with IV hydration and Zofran PRN for nausea. After her ERCP her diet was slowly advanced and she had no complaints of nausea or  vomiting prior to her discharge.    Hypertension. Patient has no history or diagnosis of HTN. She has been hypertensive in the ED but in the context of pain and IV hydration. No anti-hypertensive medications were given or started. She has a follow up appointment at the [facility] for this problem.   Heart Murmur. Patient does not recall being told in the past that she has a heart murmur. She has a 2/6 SEM heard best at the right sternal border. She is asymptomatic, denies syncope, falls, or dizziness. Her EKG from 08/21/12 shows biphasic P wave concerning for right atrial enlargement. She will need outpatient follow up with possible echocardiogram if she becomes  symptomatic.   Anemia. Her baseline Hg is unclear. Her Hemoglobin was as low as 10.8 in 2012 but was at 12.6 at the time of her admission. Today her Hb is 10.9. She denies bloody emesis, hematuria, or blood in her stool (she had not BM since her admission). Her ERCP had no complications. Her anemia panel is pending at the time of her discharge. She will have follow at the [facility] for this problem.   Hyperglycemia. Patient with elevated CBG and HbA1C if 6.1%, putting her in the prediabetes range. She has no history of diabetes. She will follow up at the [facility] for this problem.   DVT prophylaxis. For DVT prophylaxis she received Lovenox 40mg  SQ with no bleeding. Her PT was normal at 14.4 and her INR was mildly elevated at 1.14. She had no bleeding reported during this hospitalization.    Discharge Vitals:  BP 149/82  Pulse 74  Temp 99.1 F (37.3 C) (Oral)  Resp 16  Ht 5' 2.4" (1.585 m)  Wt 153 lb 1.6 oz (69.446 kg)  BMI 27.64 kg/m2  SpO2 96%  Discharge Labs:  Results for orders placed during the hospital encounter of 08/21/12 (from the past 24 hour(s))  CBC     Status: Abnormal   Collection Time   08/23/12  4:15 AM      Component Value Range   WBC 8.2  4.0 - 10.5 K/uL   RBC 3.98  3.87 - 5.11 MIL/uL   Hemoglobin 10.9 (*) 12.0 - 15.0 g/dL    HCT 62.1 (*) 30.8 - 46.0 %   MCV 82.2  78.0 - 100.0 fL   MCH 27.4  26.0 - 34.0 pg   MCHC 33.3  30.0 - 36.0 g/dL   RDW 65.7 (*) 84.6 - 96.2 %   Platelets 253  150 - 400 K/uL  COMPREHENSIVE METABOLIC PANEL     Status: Abnormal   Collection Time   08/23/12  4:15 AM      Component Value Range   Sodium 140  135 - 145 mEq/L   Potassium 3.8  3.5 - 5.1 mEq/L   Chloride 108  96 - 112 mEq/L   CO2 25  19 - 32 mEq/L   Glucose, Bld 98  70 - 99 mg/dL   BUN 5 (*) 6 - 23 mg/dL   Creatinine, Ser 9.52  0.50 - 1.10 mg/dL   Calcium 8.5  8.4 - 84.1 mg/dL   Total Protein 5.8 (*) 6.0 - 8.3 g/dL   Albumin 2.7 (*) 3.5 - 5.2 g/dL   AST 324 (*) 0 - 37 U/L   ALT 204 (*) 0 - 35 U/L   Alkaline Phosphatase 195 (*) 39 - 117 U/L   Total Bilirubin 1.2  0.3 - 1.2 mg/dL   GFR calc non Af Amer >90  >90 mL/min   GFR calc Af Amer >90  >90 mL/min    Signed: Sara Chu D 08/23/2012, 2:37 PM   Time Spent on Discharge: 40 minutes

## 2012-08-23 NOTE — Progress Notes (Addendum)
Subjective: She had pain medication once last night for moderate pain but otherwise has had not pain since her ERCP. This morning she tolerated a full liquid diet well.   She denies headache, chest pain, shortness of breath, nausea, vomiting, diarrhea, constipation, dysuria, or hematuria.  Objective: Vital signs in last 24 hours: Filed Vitals:   08/22/12 1615 08/22/12 1630 08/22/12 2123 08/23/12 0508  BP: 144/86  144/83 149/82  Pulse: 75 74 83 74  Temp:  97.3 F (36.3 C) 99.5 F (37.5 C) 99.1 F (37.3 C)  TempSrc:   Oral Oral  Resp: 18 20 18 16   Height:      Weight:      SpO2: 99% 98% 100% 96%   Weight change:   Intake/Output Summary (Last 24 hours) at 08/23/12 1308 Last data filed at 08/22/12 1856  Gross per 24 hour  Intake 631.67 ml  Output    150 ml  Net 481.67 ml   Vitals reviewed.  General: resting in bed, in NAD  HEENT: no scleral icterus  Cardiac: RRR, a 2/6 SEM is heard best at the RUSB.  Pulm: soft bibasilar lung crackles, no wheezes, rales, or rhonchi  Abd: soft, nondistended, BS present, minimum epigastric tenderness.  Ext: warm and well perfused, no pedal edema  Neuro: alert and oriented X3, cranial nerves II-XII grossly intact.   Lab Results: Basic Metabolic Panel:  Lab 08/23/12 1610 08/22/12 0550  NA 140 137  K 3.8 3.7  CL 108 103  CO2 25 21  GLUCOSE 98 68*  BUN 5* 8  CREATININE 0.54 0.52  CALCIUM 8.5 9.0  MG -- --  PHOS -- --   Liver Function Tests:  Lab 08/23/12 0415 08/22/12 0550  AST 114* 150*  ALT 204* 248*  ALKPHOS 195* 224*  BILITOT 1.2 3.6*  PROT 5.8* 6.9  ALBUMIN 2.7* 3.2*    Lab 08/21/12 1114 08/18/12 2011  LIPASE 39 26  AMYLASE -- --   CBC:  Lab 08/23/12 0415 08/22/12 0550 08/21/12 1114  WBC 8.2 9.3 --  NEUTROABS -- -- 7.0  HGB 10.9* 11.9* --  HCT 32.7* 35.9* --  MCV 82.2 83.1 --  PLT 253 266 --   Cardiac Enzymes:  Lab 08/21/12 1759  CKTOTAL --  CKMB --  CKMBINDEX --  TROPONINI <0.30   Hemoglobin  A1C:  Lab 08/21/12 1758  HGBA1C 6.1*   Thyroid Function Tests:  Lab 08/21/12 1758  TSH 1.259  T4TOTAL --  FREET4 --  T3FREE --  THYROIDAB --   Coagulation:  Lab 08/21/12 1758  LABPROT 14.4  INR 1.14   Urinalysis:  Lab 08/21/12 1444 08/18/12 2135  COLORURINE AMBER* YELLOW  LABSPEC 1.013 1.015  PHURINE 8.5* 7.5  GLUCOSEU NEGATIVE NEGATIVE  HGBUR NEGATIVE NEGATIVE  BILIRUBINUR MODERATE* NEGATIVE  KETONESUR 40* NEGATIVE  PROTEINUR NEGATIVE NEGATIVE  UROBILINOGEN 0.2 1.0  NITRITE NEGATIVE NEGATIVE  LEUKOCYTESUR TRACE* SMALL*    Studies/Results: US Abdomen Complete  08/22/2012  *RADIOLOGY REPORT*  Clinical Data:  Abdominal pain and increased liver function tests.  COMPLETE ABDOMINAL ULTRASOUND  Comparison:  08/18/2012  Findings:  Gallbladder:  Surgically absent.  Common bile duct:  10 mm in diameter, without directly visualized choledocholithiasis.  Liver:  Mildly echogenic favoring diffuse hepatic steatosis.  IVC:  Appears normal.  Pancreas:  No focal abnormality seen.  Spleen:  Measures 3.7 cm craniocaudad and appears normal.  Right Kidney:  Measures 9.4 cm in length and appears normal.  Left Kidney:  Measures 9.5 cm in  length and appears normal.  Abdominal aorta:  No aneurysm identified.  IMPRESSION:  1. Progressive dilation of the common bile duct, raising my suspicion for distal obstruction.  Equivocal punctate density in the vicinity of the ampulla on image 57 of series 5 of prior CT scan could conceivably represent a small distal CBD stone. 2.  Mildly echogenic liver suggesting mild fatty infiltration.   Original Report Authenticated By: Dellia Cloud, M.D.    Dg Ercp With Sphincterotomy  08/22/2012  *RADIOLOGY REPORT*  Clinical Data: Common bile duct stones  ERCP with sphincterotomy  Comparison:  CT 08/18/2012, ultrasound 08/22/2012  Technique:  Multiple spot images obtained with the fluoroscopic device and submitted for interpretation post-procedure.  ERCP was  performed by Dr. Arlyce Dice.  Findings: Right upper quadrant clips are in place.  On images labeled #4 and #5, there is a mobile distal common duct filling defect.  After balloon sweep, on the last image of the exam, there is no persistent visualized filling defect and contrast excretion into duodenum noted.  No common duct dilatation.  16 images submitted for evaluation.  IMPRESSION: Transient distal common duct filling defect which could represent retained stone or gas bubble.  No persistent defect on last image of the study (allowing for partial obscuration of the distal common duct).  These images were submitted for radiologic interpretation only. Please see the procedural report for the amount of contrast and the fluoroscopy time utilized.   Original Report Authenticated By: Harrel Lemon, M.D.    Dg Abd Acute W/chest  08/21/2012  *RADIOLOGY REPORT*  Clinical Data: Abdominal pain and vomiting for 3 days.  Rule out small bowel obstruction.  ACUTE ABDOMEN SERIES (ABDOMEN 2 VIEW & CHEST 1 VIEW)  Comparison: CT 08/18/2012 and 09/06/2011  Findings: Cardiomediastinal silhouette is within normal limits. The lungs are free of focal consolidations and pleural effusions. There is a small amount of atelectasis at both lung bases.  Supine and erect views of the abdomen show a nonobstructive bowel gas pattern.  Surgical clips are identified in the right upper quadrant of the abdomen.  Scoliosis of the thoracolumbar spine, convex left.  There is sclerosis of the symphysis pubis.  IMPRESSION:  1.  Minimal bibasilar atelectasis. 2.  Nonobstructive bowel gas pattern.   Original Report Authenticated By: Patterson Hammersmith, M.D.    Medications: I have reviewed the patient's current medications. Scheduled Meds:   . ampicillin-sulbactam (UNASYN) 1.5 g IVPB  1.5 g Intravenous Once  . DISCONTD: ciprofloxacin  400 mg Intravenous BID  . DISCONTD: enoxaparin (LOVENOX) injection  40 mg Subcutaneous Q24H  . DISCONTD:  pantoprazole (PROTONIX) IV  40 mg Intravenous Q24H  . DISCONTD: senna  1 tablet Oral BID   Continuous Infusions:   . sodium chloride 20 mL/hr at 08/22/12 1721  . sodium chloride    . DISCONTD: sodium chloride 125 mL/hr at 08/22/12 0348   PRN Meds:.HYDROmorphone (DILAUDID) injection, meperidine (DEMEROL) injection, ondansetron (ZOFRAN) IV, oxyCODONE, oxyCODONE, oxyCODONE, DISCONTD:  morphine injection, DISCONTD: ondansetron (ZOFRAN) IV, DISCONTD: ondansetron Assessment/Plan: 71 yo woman with no hx of EthOH, s/p cholecystectomy, with persistent epigastric pain, N/V and now elevated LFTs.   Abdominal pain. Resolved now. This had persisted and was concerning for biliary involvement, including obstruction as her Alk phos is elevated. Her LTFs were elevated with t bili at 3.4  from 0.2 on 08/18/12, fractionated bili at 2.9. Patient reported complications after her lap cholecystectomy with bowel incarceration and hernia repair a few days after her  first surgery. Her lipase remianed normal and she did not have an elevated WBC. Acute abdomen X-ray showed no pattern for obstruction. US abdomen pre-ERCP showed progressive bile duct dilatation and mild fatty infiltration. She is now s/p ERCP with stone extraction, sphincterotomy, and biopsies of duodenal polyp. LFTs trending down today with t bili normal at 1.2. Her pain has completed subsided and she is tolerating a reduced fat diet well.  -IVF @ 165ml/hr discontinued -morphine 2mg  q4h PRN for pain  -Senna for constipation prophylaxis with pain medicine regimen.   Nausea and Vomiting. Now resolved. Likely secondary to above. Per patient, N/V started after the pain. She had been unable to tolerate any PO intake prior to admission. Her electrolytes have been stable throughout this admission although she had ketones of 40 in her urine at the time of her admission c/w decreased PO intake.  - Advance diet as tolerated -IVF @ 199ml/hr d/ced today -Zofran PRN for  nausea  -Protonix IV   Hypertension. Patient has no history or diagnosis of HTN. She has been hypertensive in the ED but in the context of pain and IV hydration.  -Will continue monitoring.  -No medications at this time  -She has no PCP and will need follow up for HTN  Heart Murmur. Patient does not recall being told in the past that she has a heart murmur. She has a 2/6 SEM heard best at the right sternal border. She is asymptomatic, denies syncope, falls, or dizziness. Her EKG from 08/21/12 shows biphasic P wave concerning for right atrial enlargement. She will need outpatient follow up with possible echocardiogram if she becomes symptomatic.   Anemia. Her baseline Hg is unclear. Her Hemoglobin was as low as 10.8 in 2012 but was at 12.6 at the time of her admission. Today her Hb is 10.9. She denies bloody emesis, hematuria, or blood in her stool (she had not BM since her admission). Her ERCP had no complications.  -Anemia panel  Hyperglycemia. Patient with elevated CBG and HbA1C of 6.1%. She has no history of diabetes. She will need outpatient follow up.   DVT prophylaxis. Lovenox 40mg  SQ  Dispo. Patient may go home today pending how she tolerates a regular diet for lunch. She will follow up at the [facility] for PCP care.    LOS: 2 days   Ky Barban 08/23/2012, 1:08 PM

## 2012-08-23 NOTE — Progress Notes (Signed)
Internal Medicine Teaching Service Attending Dr.Netanya Yazdani I have examined the patient at bedside today and discussed the management of the Patient with the resident team. She is medically stable to d/c home today.

## 2012-08-23 NOTE — Progress Notes (Signed)
I saw and evaluated this patient with Vanessa Ralphs MS3. For further information please refer to my daily progress notes.   Sara Chu, MD

## 2012-08-23 NOTE — Progress Notes (Signed)
NURSING PROGRESS NOTE  KIERAN HOWDYSHELL [id number] Discharge Data: 08/23/2012 3:27 PM Attending Provider: Tacey Heap, MD WUJ:[email or web address], MD     Clyda Hurdle to be D/C'd Home per MD order.  Discussed with the patient the After Visit Summary and all questions fully answered. All IV's discontinued with no bleeding noted. All belongings returned to patient for patient to take home.   Last Vital Signs:  Blood pressure 145/81, pulse 81, temperature 98.7 F (37.1 C), temperature source Oral, resp. rate 20, height 5' 2.4" (1.585 m), weight 69.446 kg (153 lb 1.6 oz), SpO2 98.00%.  Discharge Medication List   Medication List     As of 08/23/2012  3:27 PM    TAKE these medications         docusate sodium 100 MG capsule   Commonly known as: COLACE   Take 1 capsule (100 mg total) by mouth 2 (two) times daily.      multivitamin with minerals Tabs   Take 1 tablet by mouth daily.      omega-3 acid ethyl esters 1 G capsule   Commonly known as: LOVAZA   Take 1 g by mouth daily.      ondansetron 4 MG tablet   Commonly known as: ZOFRAN   Take 1 tablet (4 mg total) by mouth every 8 (eight) hours as needed for nausea.      oxyCODONE-acetaminophen 5-325 MG per tablet   Commonly known as: PERCOCET/ROXICET   1 to 2 tabs PO q6hrs  PRN for pain        Jesson Foskey, Elmarie Mainland, RN

## 2012-08-23 NOTE — Progress Notes (Signed)
     [facility] Daily Rounding Note 08/23/2012, 8:35 AM  SUBJECTIVE:       Last oxycodone 5mg  at 2042 last night.  Diet is full liquids, first of these was this AM.   She feels very well, the best she has in many days. Was told by a female staff member this AM she would stay one more day.   OBJECTIVE:         Vital signs in last 24 hours:    Temp:  [97.1 F (36.2 C)-99.5 F (37.5 C)] 99.1 F (37.3 C) (10/08 0508) Pulse Rate:  [73-98] 74  (10/08 0508) Resp:  [13-20] 16  (10/08 0508) BP: (144-167)/(74-86) 149/82 mmHg (10/08 0508) SpO2:  [96 %-100 %] 96 % (10/08 0508) Last BM Date: 08/21/12 General: looks well.     Heart: RRR Chest: clear.  No trouble breathing Abdomen: soft, minor tenderness in upper belly .  Active BS  Extremities: no pedal edema Neuro/Psych:  No deficits.  No confused.  Cooperative and pleasant    Lab Results:  Basename 08/23/12 0415 08/22/12 0550 08/21/12 1114  WBC 8.2 9.3 9.0  HGB 10.9* 11.9* 12.6  HCT 32.7* 35.9* 37.4  PLT 253 266 303   BMET  Basename 08/23/12 0415 08/22/12 0550 08/21/12 1114  NA 140 137 138  K 3.8 3.7 3.7  CL 108 103 101  CO2 25 21 26   GLUCOSE 98 68* 105*  BUN 5* 8 5*  CREATININE 0.54 0.52 0.54  CALCIUM 8.5 9.0 9.3  MCV             83.1  LFT  Basename 08/23/12 0415 08/22/12 0550 08/21/12 1114  PROT 5.8* 6.9 7.6  ALBUMIN 2.7* 3.2* 3.6  AST 114* 150* 136*  ALT 204* 248* 258*  ALKPHOS 195* 224* 229*  BILITOT 1.2 3.6* 3.4*  BILIDIR -- 2.9* --  IBILI -- 0.7 --   PT/INR  Basename 08/21/12 1758  LABPROT 14.4  INR 1.14    Studies/Results:   ASSESMENT: *  Choledocholithiasis.  S/p ERCP with Shint/removal of stone 08/22/12 LFTs improved.  She looks and fell great.  No reason she can not go home today *  Lap chole 08/2011 *  Normocytic anemia.   PLAN: * Low fat diet, should d/c home today.  GI follow up as needed..  GI signing off.    LOS: 2 days   Jennye Moccasin  08/23/2012, 8:35 AM Pager: [phone]

## 2012-08-23 NOTE — Progress Notes (Signed)
Medical Student Daily Progress Note   Subjective:    Interval Events:  No acute events overnight.  Patient tolerated clear liquid diet last night.  Voiding spontaneously but no BM since Sunday. Denies N/V and abdominal pain this morning.  Only required one dose of oxycodone 5 mg overnight for pain control.     Objective:    Vital Signs:   Temp:  [97.1 F (36.2 C)-99.5 F (37.5 C)] 99.1 F (37.3 C) (10/08 0508) Pulse Rate:  [73-98] 74  (10/08 0508) Resp:  [13-20] 16  (10/08 0508) BP: (144-167)/(74-86) 149/82 mmHg (10/08 0508) SpO2:  [96 %-100 %] 96 % (10/08 0508) Last BM Date: 08/21/12   Weights: 24-hour Weight change:   Filed Weights   08/21/12 1927  Weight: 69.446 kg (153 lb 1.6 oz)     Intake/Output:   Intake/Output Summary (Last 24 hours) at 08/23/12 0743 Last data filed at 08/22/12 1856  Gross per 24 hour  Intake 631.67 ml  Output    150 ml  Net 481.67 ml       Physical Exam: General: Lying in bed in NAD.  HEENT: EOMI. PERRLA. No scleral icterus. Neck is supple with no cervical lymphadenopathy.  CV: RRR. 2/6 systolic murmur heard best at R sternal border. Resp: Normal work of breathing. CTAB.  Abd: Soft, non-tender, non-distended. No masses appreciated. Active bowel sounds.  Ext: DP and PT pulses 2+ bilaterally. Trace edema in LE bilaterally. Warm and well-perfused.  Neuro: A&Ox3. No weakness or sensory deficits. CN II-XII intact bilaterally.   Labs: Basic Metabolic Panel:  Lab 08/23/12 1610 08/22/12 0550 08/21/12 1114 08/18/12 1813  NA 140 137 138 140  K 3.8 3.7 3.7 3.5  CL 108 103 101 102  CO2 25 21 26 26   GLUCOSE 98 68* 105* 114*  BUN 5* 8 5* 9  CREATININE 0.54 0.52 0.54 0.53  CALCIUM 8.5 9.0 9.3 --  MG -- -- -- --  PHOS -- -- -- --    Liver Function Tests:  Lab 08/23/12 0415 08/22/12 0550 08/21/12 1114 08/18/12 1813  AST 114* 150* 136* 49*  ALT 204* 248* 258* 29  ALKPHOS 195* 224* 229* 122*  BILITOT 1.2 3.6* 3.4* 0.2*  PROT 5.8* 6.9  7.6 7.9  ALBUMIN 2.7* 3.2* 3.6 3.9    Lab 08/21/12 1114 08/18/12 2011  LIPASE 39 26  AMYLASE -- --    CBC:  Lab 08/23/12 0415 08/22/12 0550 08/21/12 1114 08/18/12 1813  WBC 8.2 9.3 9.0 11.7*  NEUTROABS -- -- 7.0 --  HGB 10.9* 11.9* 12.6 12.4  HCT 32.7* 35.9* 37.4 36.7  MCV 82.2 83.1 82.9 82.7  PLT 253 266 303 289    Cardiac Enzymes:  Lab 08/21/12 1759  CKTOTAL --  CKMB --  CKMBINDEX --  TROPONINI <0.30    Coagulation Studies:  Basename 08/21/12 1758  LABPROT 14.4  INR 1.14    Other results: EKG Results 08/21/12: Rate: 76  PR: 148  QRS: 74  QTc: 427  EKG: Normal sinus rhythm.   Imaging: US Abdomen Complete  08/22/2012  *RADIOLOGY REPORT*  Clinical Data:  Abdominal pain and increased liver function tests.  COMPLETE ABDOMINAL ULTRASOUND  Comparison:  08/18/2012  Findings:  Gallbladder:  Surgically absent.  Common bile duct:  10 mm in diameter, without directly visualized choledocholithiasis.  Liver:  Mildly echogenic favoring diffuse hepatic steatosis.  IVC:  Appears normal.  Pancreas:  No focal abnormality seen.  Spleen:  Measures 3.7 cm craniocaudad and appears normal.  Right Kidney:  Measures 9.4 cm in length and appears normal.  Left Kidney:  Measures 9.5 cm in length and appears normal.  Abdominal aorta:  No aneurysm identified.  IMPRESSION:  1. Progressive dilation of the common bile duct, raising my suspicion for distal obstruction.  Equivocal punctate density in the vicinity of the ampulla on image 57 of series 5 of prior CT scan could conceivably represent a small distal CBD stone. 2.  Mildly echogenic liver suggesting mild fatty infiltration.   Original Report Authenticated By: Dellia Cloud, M.D.    Dg Ercp With Sphincterotomy  08/22/2012  *RADIOLOGY REPORT*  Clinical Data: Common bile duct stones  ERCP with sphincterotomy  Comparison:  CT 08/18/2012, ultrasound 08/22/2012  Technique:  Multiple spot images obtained with the fluoroscopic device and  submitted for interpretation post-procedure.  ERCP was performed by Dr. Arlyce Dice.  Findings: Right upper quadrant clips are in place.  On images labeled #4 and #5, there is a mobile distal common duct filling defect.  After balloon sweep, on the last image of the exam, there is no persistent visualized filling defect and contrast excretion into duodenum noted.  No common duct dilatation.  16 images submitted for evaluation.  IMPRESSION: Transient distal common duct filling defect which could represent retained stone or gas bubble.  No persistent defect on last image of the study (allowing for partial obscuration of the distal common duct).  These images were submitted for radiologic interpretation only. Please see the procedural report for the amount of contrast and the fluoroscopy time utilized.   Original Report Authenticated By: Harrel Lemon, M.D.    Dg Abd Acute W/chest  08/21/2012  *RADIOLOGY REPORT*  Clinical Data: Abdominal pain and vomiting for 3 days.  Rule out small bowel obstruction.  ACUTE ABDOMEN SERIES (ABDOMEN 2 VIEW & CHEST 1 VIEW)  Comparison: CT 08/18/2012 and 09/06/2011  Findings: Cardiomediastinal silhouette is within normal limits. The lungs are free of focal consolidations and pleural effusions. There is a small amount of atelectasis at both lung bases.  Supine and erect views of the abdomen show a nonobstructive bowel gas pattern.  Surgical clips are identified in the right upper quadrant of the abdomen.  Scoliosis of the thoracolumbar spine, convex left.  There is sclerosis of the symphysis pubis.  IMPRESSION:  1.  Minimal bibasilar atelectasis. 2.  Nonobstructive bowel gas pattern.   Original Report Authenticated By: Patterson Hammersmith, M.D.       Medications:    Infusions:    . sodium chloride 20 mL/hr at 08/22/12 1721  . sodium chloride    . DISCONTD: sodium chloride 125 mL/hr at 08/22/12 0348     Scheduled Medications:    . ampicillin-sulbactam (UNASYN) 1.5 g IVPB   1.5 g Intravenous Once  . influenza  inactive virus vaccine  0.5 mL Intramuscular Tomorrow-1000  . pneumococcal 23 valent vaccine  0.5 mL Intramuscular Tomorrow-1000  . DISCONTD: ciprofloxacin  400 mg Intravenous BID  . DISCONTD: enoxaparin (LOVENOX) injection  40 mg Subcutaneous Q24H  . DISCONTD: pantoprazole (PROTONIX) IV  40 mg Intravenous Q24H  . DISCONTD: senna  1 tablet Oral BID     PRN Medications: HYDROmorphone (DILAUDID) injection, meperidine (DEMEROL) injection, ondansetron (ZOFRAN) IV, oxyCODONE, oxyCODONE, oxyCODONE, DISCONTD:  morphine injection, DISCONTD: ondansetron (ZOFRAN) IV, DISCONTD: ondansetron   Assessment/ Plan:    Mrs. Flom is a 71 yo AA woman with a PMH significant for acute cholecystitis with cholelithiasis s/p cholecystectomy (08/2011) who presented with persistent abdominal  pain, N/V, and elevated LFTs. She is now s/p ERCP with stone removal and sphincterotomy.  #Abdominal pain  Ms. Hable presented with a 5-day history of "achy" epigastric pain that was a 12/10 at admission but is now a 0/10 with oral pain meds. ERCP on 10/7 showed a duodenal polyp and a single stone measuring 4-5 mm.  Biopsies of the polyp were taken and the stone was extracted.  LFTs are elevated but downtrending: AST 150-->114; ALT 258-->204; alk phos 224-->195; Tbili 3.4-->1.2.  Choledocholithiasis could be the sole reason for her pain and elevated LFTs, but malignancy is still a possibility given the duodenal polyp. Pre-procedure CT abdomen shows biliary ductal prominence and abdominal US shows progressive dilation of common bile duct. Abdominal Xray at admission shows no SBO. Lipase is normal.  --s/p ERCP with stone extraction and sphincterotomy --advance diet to full liquid --oxycodone 5 mg po q6hrs prn pain --senna for constipation ppx  --f/u biopsy results --Patient will follow up in outpatient internal medicine clinic here.  #N/V  Likely secondary to a biliary etiology as described  above. Denies N/V this morning. Emesis was non-bloody over the weekend. She tolerated a clear liquid diet yesterday evening.  --Zofran prn nausea  --d/c IVF as patient is tolerating po --advance diet to full liquid  #Hypertension  Patient was hypertensive in the ED but does not have a diagnosis of HTN. Over the past 24 hours BPs have been 140s-160s over 80s.  --Continue to monitor, but will not medicate at this time. Patient will follow-up in the outpatient internal medicine clinic here, so it can be addressed at that visit.  #Murmur 2/6 systolic murmur heard best at R sternal border.  Patient is asymptomatic:denies syncope, falls, or dizziness.  EKG shows possible right atrial enlargement.  Would consider echo at follow-up  #PPX  --DVT: enoxaparin 40 mg IV once daily  --GI: pantoprazole 40 mg IV once daily  #Dispo Discharge today if patient tolerates po intake at lunch.    This is a Psychologist, occupational Note.  The care of the patient was discussed with Dr. Garald Braver and the assessment and plan formulated with their assistance.  Please see their attached note or addendum for official documentation of the daily encounter.

## 2012-08-24 ENCOUNTER — Telehealth: Payer: Self-pay

## 2012-08-24 ENCOUNTER — Encounter (HOSPITAL_COMMUNITY): Payer: Self-pay | Admitting: Gastroenterology

## 2012-08-24 DIAGNOSIS — R109 Unspecified abdominal pain: Secondary | ICD-10-CM

## 2012-08-24 DIAGNOSIS — D132 Benign neoplasm of duodenum: Secondary | ICD-10-CM

## 2012-08-24 NOTE — Progress Notes (Signed)
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 I will arrange this as an outpatient.

## 2012-08-24 NOTE — Telephone Encounter (Signed)
Pt has been scheduled for an office visit and labs she is aware

## 2012-08-24 NOTE — Telephone Encounter (Signed)
Message copied by Donata Duff on Wed Aug 24, 2012  7:49 AM ------      Message from: Rob Bunting P      Created: Wed Aug 24, 2012  7:24 AM       Tomeka Kantner,            She needs rov with me in next 2-3 weeks to discuss ERCP, duodenal polyp.  Should have LFTs the day prior.            Thanks            Maralyn Sago,      Thanks            dan      ----- Message -----         From: Dianah Field, PA         Sent: 08/23/2012   5:05 PM           To: Rachael Fee, MD, Louis Meckel, MD, #            Dr Arlyce Dice says this pt needs to have her duodenal polyp removed.  You are her GI primary.  So I am sending this to you.  Check out ERCP note of 10/7 for details.        She went home before I was told to make the appt to follow up             Thanks Sarah.

## 2012-08-26 ENCOUNTER — Ambulatory Visit (INDEPENDENT_AMBULATORY_CARE_PROVIDER_SITE_OTHER): Payer: BC Managed Care – PPO | Admitting: Internal Medicine

## 2012-08-26 ENCOUNTER — Encounter: Payer: Self-pay | Admitting: Internal Medicine

## 2012-08-26 VITALS — BP 140/86 | HR 81 | Temp 97.6°F | Ht 62.5 in | Wt 156.4 lb

## 2012-08-26 DIAGNOSIS — R7303 Prediabetes: Secondary | ICD-10-CM

## 2012-08-26 DIAGNOSIS — R7309 Other abnormal glucose: Secondary | ICD-10-CM

## 2012-08-26 DIAGNOSIS — I10 Essential (primary) hypertension: Secondary | ICD-10-CM

## 2012-08-26 DIAGNOSIS — D649 Anemia, unspecified: Secondary | ICD-10-CM

## 2012-08-26 LAB — CBC
HCT: 36.3 % (ref 36.0–46.0)
MCH: 27 pg (ref 26.0–34.0)
MCV: 81 fL (ref 78.0–100.0)
RBC: 4.48 MIL/uL (ref 3.87–5.11)
RDW: 16.5 % — ABNORMAL HIGH (ref 11.5–15.5)
WBC: 7.5 10*3/uL (ref 4.0–10.5)

## 2012-08-26 LAB — COMPREHENSIVE METABOLIC PANEL
BUN: 6 mg/dL (ref 6–23)
CO2: 28 mEq/L (ref 19–32)
Calcium: 9.8 mg/dL (ref 8.4–10.5)
Chloride: 100 mEq/L (ref 96–112)
Creat: 0.48 mg/dL — ABNORMAL LOW (ref 0.50–1.10)
Total Bilirubin: 0.9 mg/dL (ref 0.3–1.2)

## 2012-08-26 NOTE — Progress Notes (Addendum)
  Subjective:    Patient ID: Teresa Keller, female    DOB: 1941/01/09, 71 y.o.   MRN: [id number]  HPI  Presents for post hospital follow-up for retained bile duct stone (cholectecomy 2012) s/p ERCP.  Pt reports doing well and is scheduled to follow-up with [facility] in November for further assessment of duodenal adenoma.     Review of Systems  Constitutional: Negative for fever and fatigue.  Respiratory: Negative for shortness of breath.   Cardiovascular: Negative for chest pain.  Gastrointestinal: Negative for nausea, vomiting, abdominal pain, diarrhea and blood in stool.  Genitourinary: Negative for dysuria.  Neurological: Negative for dizziness, weakness and headaches.       Objective:   Physical Exam  Constitutional: She is oriented to person, place, and time. She appears well-developed and well-nourished. No distress.  HENT:  Head: Normocephalic and atraumatic.  Eyes: Conjunctivae normal and EOM are normal. Pupils are equal, round, and reactive to light.  Neck: Normal range of motion. Neck supple.  Cardiovascular: Normal rate, regular rhythm and normal heart sounds.   No murmur heard.      No murmur appreciated on exam today  Pulmonary/Chest: Effort normal and breath sounds normal.  Abdominal: Soft. Bowel sounds are normal.  Musculoskeletal: Normal range of motion. She exhibits no edema.  Neurological: She is alert and oriented to person, place, and time.  Skin: Skin is warm and dry.  Psychiatric: She has a normal mood and affect.          Assessment & Plan:  1. Prediabetes: HgbA1c 6.1, pt reports that previously she could only eat breads due to the GI pain over several weeks, given her age, will elect for lifestyle modification today and reassess HgbA1c in 3 months  2. Hypertension Stage 1 likely: bp 140/86 pulse 81 today, will reassess at next visit and will defer antihypertensive today as pt is very resistant to medications, if still elevated at next visit pt has  been counseled for possible need for therapy  3. S/p ERCP: for retained gallstone in setting of s/p cholesctectomy, pt to f/u up with GI Nov 2013. -check CMET today  4. 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

## 2012-08-26 NOTE — Patient Instructions (Signed)
It was nice to meet you today, Teresa Keller. I am glad that your abdominal pain has resolved. Keep your appointment with the gut doctors as scheduled in November. Your blood pressure was still a little elevated today.  We will not start medications as you requested but will recheck your blood pressure at your next visit as well as your blood sugar level. Follow-up in 3 months.

## 2012-09-22 ENCOUNTER — Telehealth: Payer: Self-pay | Admitting: Gastroenterology

## 2012-09-22 NOTE — Telephone Encounter (Signed)
Labs have already been entered into EPIC

## 2012-09-23 ENCOUNTER — Other Ambulatory Visit (INDEPENDENT_AMBULATORY_CARE_PROVIDER_SITE_OTHER): Payer: BC Managed Care – PPO

## 2012-09-23 DIAGNOSIS — R109 Unspecified abdominal pain: Secondary | ICD-10-CM

## 2012-09-23 LAB — HEPATIC FUNCTION PANEL
AST: 22 U/L (ref 0–37)
Alkaline Phosphatase: 85 U/L (ref 39–117)
Bilirubin, Direct: 0.2 mg/dL (ref 0.0–0.3)
Total Bilirubin: 0.5 mg/dL (ref 0.3–1.2)

## 2012-09-26 ENCOUNTER — Ambulatory Visit (INDEPENDENT_AMBULATORY_CARE_PROVIDER_SITE_OTHER): Payer: BC Managed Care – PPO | Admitting: Gastroenterology

## 2012-09-26 ENCOUNTER — Ambulatory Visit: Payer: BC Managed Care – PPO | Admitting: Gastroenterology

## 2012-09-26 ENCOUNTER — Encounter: Payer: Self-pay | Admitting: Gastroenterology

## 2012-09-26 VITALS — BP 116/68 | HR 88 | Ht 61.25 in | Wt 150.0 lb

## 2012-09-26 DIAGNOSIS — K319 Disease of stomach and duodenum, unspecified: Secondary | ICD-10-CM

## 2012-09-26 DIAGNOSIS — D132 Benign neoplasm of duodenum: Secondary | ICD-10-CM

## 2012-09-26 NOTE — Progress Notes (Signed)
Review of pertinent gastrointestinal problems: 1. laparoscopic cholecystectomy 09-04-11 by Dr. Corliss Skains. The pathology report confirmed chronic Cholecystitis. The patient then developed an incarcerated Ventral hernia, at the site of the umbilical trocar site. This a required repair by Dr. Ezzard Standing on 09/07/11. The patient reports that they are feeling well with normal bowel movements and good appetite. The pre-operative symptoms of abdominal pain, nausea, and vomiting have resolved. She is back to a normal diet.  2. CBD stone causing jaundice 10/13: ERCP Dr. Arlyce Dice biliary sphincterotomy, small stone removed.  Also found 2.5cm periamp duodenal polyp, was adenomatous on mucosal biopsies   HPI: This is a  very pleasant 71 year old woman whom I last saw about 2 years ago. She was recently hospitalized with small bile duct stone that was removed by ERCP. Incidentally Dr. Arlyce Dice also saw a 2-3 cm. 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    Past Medical History  Diagnosis Date  . Arthritis   . Uncontrolled hypertension, stage 1 08/26/2012    Past Surgical History  Procedure Date  . Tubal ligation   . Cholecystectomy october 2012  . Ercp 08/22/2012    Procedure: ENDOSCOPIC RETROGRADE CHOLANGIOPANCREATOGRAPHY (ERCP);  Surgeon: Louis Meckel, MD;  Location: [facility] OR;  Service: Endoscopy;  Laterality: N/A;    Current Outpatient Prescriptions  Medication Sig Dispense Refill  . Calcium Carbonate-Vitamin D (CALCIUM-VITAMIN D) 500-200 MG-UNIT per tablet Take 1 tablet by mouth 2 (two) times daily with a meal.      . Multiple Vitamin (MULTIVITAMIN WITH MINERALS) TABS Take 1 tablet by mouth daily.      Marland Kitchen omega-3 acid ethyl esters (LOVAZA) 1 G capsule Take 1 g by mouth daily.      . ondansetron (ZOFRAN) 4 MG tablet Take 1 tablet (4 mg total) by mouth every 8 (eight) hours as needed for nausea.  6 tablet  0  . oxyCODONE-acetaminophen (PERCOCET/ROXICET) 5-325 MG per tablet 1 to 2 tabs PO q6hrs   PRN for pain  10 tablet  0  . polyethylene glycol (MIRALAX / GLYCOLAX) packet Take 17 g by mouth daily.        Allergies as of 09/26/2012 - Review Complete 09/26/2012  Allergen Reaction Noted  . Codeine      Family History  Problem Relation Age of Onset  . Hypertension Mother   . Hypertension Sister   . Colon cancer Mother     History   Social History  . Marital Status: Widowed    Spouse Name: N/A    Number of Children: N/A  . Years of Education: N/A   Occupational History  . Not on file.   Social History Main Topics  . Smoking status: Never Smoker   . Smokeless tobacco: Never Used  . Alcohol Use: No  . Drug Use: No  . Sexually Active: No   Other Topics Concern  . Not on file   Social History Narrative  . 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 Perhaps this is more than just a polyp. If there is no sign  of deeper invasion and the polyp does not involve the ampulla and I would personally feel comfortable removing this endoscopically. If the lesion shows sign of invasion then she will require surgical evaluation. If the polyp does not appear to be deep however involves the ampulla, then I would not feel comfortable removing this endoscopically and I would refer her to tertiary center. She wants to wait until after the new year and I think that is biologically safe.

## 2012-09-26 NOTE — Patient Instructions (Addendum)
You will be set up for an upper endoscopic ultrasound to evaluate duodenal polyp noted last month (+propofol, 60 min, radial, +/- linear, next avail EUS day).

## 2012-11-01 ENCOUNTER — Encounter (HOSPITAL_COMMUNITY): Payer: Self-pay | Admitting: Pharmacy Technician

## 2012-11-03 ENCOUNTER — Encounter (HOSPITAL_COMMUNITY): Payer: Self-pay | Admitting: *Deleted

## 2012-11-03 NOTE — Pre-Procedure Instructions (Signed)
Your procedure is scheduled WU:[id number], November 21, 2012 Report to [facility] Admitting at:1100 Call this number if you have problems morning of your procedure:[phone]  Follow all bowel prep instructions per your doctor's orders.  Do not eat or drink anything after midnight the night before your procedure. You may brush your teeth, rinse out your mouth, but no water, no food, no chewing gum, no mints, no candies, no chewing tobacco.     Take these medicines the morning of your procedure with A SIP OF WATER:None   Please make arrangements for a responsible person to drive you home after the procedure. You cannot go home by cab/taxi. We recommend you have someone with you at home the first 24 hours after your procedure. Driver for procedure is daughter and grand daughter  LEAVE ALL VALUABLES, JEWELRY, BILLFOLD AT HOME.  NO DENTURES, CONTACT LENSES ALLOWED IN THE ENDOSCOPY ROOM.   YOU MAY WEAR DEODORANT, PLEASE REMOVE ALL JEWELRY, WATCHES RINGS, BODY PIERCINGS AND LEAVE AT HOME.   WOMEN: NO MAKE-UP, LOTIONS PERFUMES

## 2012-11-21 ENCOUNTER — Encounter (HOSPITAL_COMMUNITY): Payer: Self-pay | Admitting: Anesthesiology

## 2012-11-21 ENCOUNTER — Telehealth: Payer: Self-pay

## 2012-11-21 ENCOUNTER — Ambulatory Visit (HOSPITAL_COMMUNITY): Payer: BC Managed Care – PPO | Admitting: Anesthesiology

## 2012-11-21 ENCOUNTER — Encounter (HOSPITAL_COMMUNITY): Payer: Self-pay | Admitting: Gastroenterology

## 2012-11-21 ENCOUNTER — Ambulatory Visit (HOSPITAL_COMMUNITY)
Admission: RE | Admit: 2012-11-21 | Discharge: 2012-11-21 | Disposition: A | Discharge status: Home / Self Care | Payer: BC Managed Care – PPO | Source: Ambulatory Visit | Attending: Gastroenterology | Admitting: Gastroenterology

## 2012-11-21 ENCOUNTER — Encounter (HOSPITAL_COMMUNITY)
Admission: RE | Disposition: A | Discharge status: Home / Self Care | Payer: Self-pay | Source: Ambulatory Visit | Attending: Gastroenterology

## 2012-11-21 DIAGNOSIS — D1330 Benign neoplasm of unspecified part of small intestine: Secondary | ICD-10-CM | POA: Insufficient documentation

## 2012-11-21 DIAGNOSIS — I10 Essential (primary) hypertension: Secondary | ICD-10-CM | POA: Insufficient documentation

## 2012-11-21 DIAGNOSIS — D132 Benign neoplasm of duodenum: Secondary | ICD-10-CM

## 2012-11-21 DIAGNOSIS — K319 Disease of stomach and duodenum, unspecified: Secondary | ICD-10-CM

## 2012-11-21 DIAGNOSIS — R933 Abnormal findings on diagnostic imaging of other parts of digestive tract: Secondary | ICD-10-CM

## 2012-11-21 DIAGNOSIS — Z9089 Acquired absence of other organs: Secondary | ICD-10-CM | POA: Insufficient documentation

## 2012-11-21 HISTORY — PX: EUS: SHX5427

## 2012-11-21 SURGERY — UPPER ENDOSCOPIC ULTRASOUND (EUS) LINEAR
Anesthesia: Monitor Anesthesia Care

## 2012-11-21 MED ORDER — LACTATED RINGERS IV SOLN
INTRAVENOUS | Status: DC
  Administered 2012-11-21: 1000 mL via INTRAVENOUS

## 2012-11-21 MED ORDER — KETAMINE HCL 10 MG/ML IJ SOLN
INTRAMUSCULAR | Status: DC | PRN
  Administered 2012-11-21 (×2): 10 mg via INTRAVENOUS

## 2012-11-21 MED ORDER — PROPOFOL 10 MG/ML IV EMUL
INTRAVENOUS | Status: DC | PRN
  Administered 2012-11-21: 100 ug/kg/min via INTRAVENOUS

## 2012-11-21 MED ORDER — PROMETHAZINE HCL 25 MG/ML IJ SOLN: 6.2500 mg | INTRAMUSCULAR | Status: DC | PRN

## 2012-11-21 MED ORDER — MIDAZOLAM HCL 5 MG/5ML IJ SOLN
INTRAMUSCULAR | Status: DC | PRN
  Administered 2012-11-21 (×2): 1 mg via INTRAVENOUS

## 2012-11-21 MED ORDER — SODIUM CHLORIDE 0.9 % IV SOLN: INTRAVENOUS | Status: DC

## 2012-11-21 MED ORDER — FENTANYL CITRATE 0.05 MG/ML IJ SOLN
INTRAMUSCULAR | Status: DC | PRN
  Administered 2012-11-21 (×2): 50 ug via INTRAVENOUS

## 2012-11-21 NOTE — Anesthesia Preprocedure Evaluation (Signed)
Anesthesia Evaluation  Patient identified by MRN, date of birth, ID band Patient awake    Reviewed: Allergy & Precautions, H&P , NPO status , Patient's Chart, lab work & pertinent test results  Airway Mallampati: II TM Distance: >3 FB Neck ROM: Full    Dental No notable dental hx.    Pulmonary neg pulmonary ROS,  breath sounds clear to auscultation  Pulmonary exam normal       Cardiovascular hypertension, Pt. on medications Rhythm:Regular Rate:Normal     Neuro/Psych negative neurological ROS  negative psych ROS   GI/Hepatic negative GI ROS, Neg liver ROS,   Endo/Other  negative endocrine ROS  Renal/GU negative Renal ROS  negative genitourinary   Musculoskeletal negative musculoskeletal ROS (+)   Abdominal   Peds negative pediatric ROS (+)  Hematology negative hematology ROS (+)   Anesthesia Other Findings   Reproductive/Obstetrics negative OB ROS                           Anesthesia Physical Anesthesia Plan  ASA: II  Anesthesia Plan: MAC   Post-op Pain Management:    Induction: Intravenous  Airway Management Planned: Nasal Cannula  Additional Equipment:   Intra-op Plan:   Post-operative Plan:   Informed Consent: I have reviewed the patients History and Physical, chart, labs and discussed the procedure including the risks, benefits and alternatives for the proposed anesthesia with the patient or authorized representative who has indicated his/her understanding and acceptance.     Plan Discussed with: CRNA and Surgeon  Anesthesia Plan Comments:         Anesthesia Quick Evaluation

## 2012-11-21 NOTE — Op Note (Signed)
[facility] [address] [place], [address]   ENDOSCOPIC ULTRASOUND PROCEDURE REPORT  PATIENT: Teresa Keller, Teresa Keller  MR#: [id number] BIRTHDATE: 19-Aug-1941  GENDER: Female ENDOSCOPIST: Rachael Fee, MD PROCEDURE DATE:  11/21/2012 PROCEDURE:   Upper EUS ASA CLASS:      Class II INDICATIONS:   periampullary duodenal adenoma, noted incidentally 2 months ago during ERCP for CBD stones; biopsies confirmed adenom without HGD. MEDICATIONS: These medications were titrated to patient response per physician's verbal order  DESCRIPTION OF PROCEDURE:   After the risks benefits and alternatives of the procedure were  explained, informed consent was obtained. The patient was then placed in the left, lateral, decubitus postion and IV sedation was administered. Throughout the procedure, the patients blood pressure, pulse and oxygen saturations were monitored continuously.  Under direct visualization, the ERCP 110520 and Pentax Linear [id number]  endoscope was introduced through the mouth  and advanced to the second portion of the duodenum .  Water was used as necessary to provide an acoustic interface.  Upon completion of the imaging, water was removed and the patient was sent to the recovery room in satisfactory condition.    Endoscopic views (with duodenoscope): 1. There was a 1-2cm polyp directly adjacent to the major papilla. The polyp touches the major papilla along about 90 degrees.  EUS findings: 1. The polyp above corresponded with a 12mm hypoechoic lesion that involves the mucosa,deep mucosal layers but does not appear to involve the muscularis propria layer of duodenal wall. 2. Pancreatic parenchyma was normal. 3. Main pancreatic duct was normal, non-dilated 4. CBD was normal, non-dilated and without filling defects 5. Limited views of liver, spleen, portal and splenic vessels were all normal  Impression: 12mm periampullary adenoma which does not appear  invasive but does involve 90 degrees of the major papilla structure.  This should be considered for elective endoscopic resection at tertiary facility. My office will help arrange this referral.    _______________________________ eSigned:  Rachael Fee, MD 11/21/2012 12:27 PM

## 2012-11-21 NOTE — Anesthesia Postprocedure Evaluation (Signed)
  Anesthesia Post-op Note  Patient: Teresa Keller  Procedure(s) Performed: Procedure(s) (LRB): UPPER ENDOSCOPIC ULTRASOUND (EUS) LINEAR (N/A)  Patient Location: PACU  Anesthesia Type: MAC  Level of Consciousness: awake and alert   Airway and Oxygen Therapy: Patient Spontanous Breathing  Post-op Pain: mild  Post-op Assessment: Post-op Vital signs reviewed, Patient's Cardiovascular Status Stable, Respiratory Function Stable, Patent Airway and No signs of Nausea or vomiting  Last Vitals:  Filed Vitals:   11/21/12 1226  BP: 120/72  Pulse:   Temp:   Resp: 13    Post-op Vital Signs: stable   Complications: No apparent anesthesia complications

## 2012-11-21 NOTE — Telephone Encounter (Signed)
Message left with [facility] to call back to schedule appt

## 2012-11-21 NOTE — Transfer of Care (Signed)
Immediate Anesthesia Transfer of Care Note  Patient: Teresa Keller  Procedure(s) Performed: Procedure(s) (LRB) with comments: UPPER ENDOSCOPIC ULTRASOUND (EUS) LINEAR (N/A)  Patient Location: PACU and Endoscopy Unit  Anesthesia Type:MAC  Level of Consciousness: sedated and patient cooperative  Airway & Oxygen Therapy: Patient Spontanous Breathing and Patient connected to nasal cannula oxygen  Post-op Assessment: Report given to PACU RN and Post -op Vital signs reviewed and stable  Post vital signs: Reviewed and stable  Complications: No apparent anesthesia complications

## 2012-11-21 NOTE — Telephone Encounter (Signed)
Message copied by Donata Duff on Mon Nov 21, 2012  1:08 PM ------      Message from: Rob Bunting P      Created: Mon Nov 21, 2012 12:28 PM       Onesimo Lingard,      She needs referral to [facility] to consider endoscopic removal for periampullary duodenal diverticulum. Please send Dr. Marzetta Board ERCP report, my last office visit note, pathology report from [facility] ERCP and a copy of todays EUS            Thanks

## 2012-11-21 NOTE — H&P (Signed)
  Review of pertinent gastrointestinal problems:  1. laparoscopic cholecystectomy 09-04-11 by Dr. Corliss Skains. The pathology report confirmed chronic Cholecystitis. The patient then developed an incarcerated Ventral hernia, at the site of the umbilical trocar site. This a required repair by Dr. Ezzard Standing on 09/07/11. The patient reports that they are feeling well with normal bowel movements and good appetite. The pre-operative symptoms of abdominal pain, nausea, and vomiting have resolved. She is back to a normal diet.  2. CBD stone causing jaundice 10/13: ERCP Dr. Arlyce Dice biliary sphincterotomy, small stone removed. Also found 2.5cm periamp duodenal polyp, was adenomatous on mucosal biopsies    HPI: This is a woman with duodenal adenom, see above    Past Medical History  Diagnosis Date  . Arthritis   . Uncontrolled hypertension, stage 1 08/26/2012    Past Surgical History  Procedure Date  . Tubal ligation   . Cholecystectomy october 2012  . Ercp 08/22/2012    Procedure: ENDOSCOPIC RETROGRADE CHOLANGIOPANCREATOGRAPHY (ERCP);  Surgeon: Louis Meckel, MD;  Location: [facility] OR;  Service: Endoscopy;  Laterality: N/A;    Current Facility-Administered Medications  Medication Dose Route Frequency Provider Last Rate Last Dose  . 0.9 %  sodium chloride infusion   Intravenous Continuous Rachael Fee, MD        Allergies as of 09/26/2012 - Review Complete 09/26/2012  Allergen Reaction Noted  . Codeine      Family History  Problem Relation Age of Onset  . Hypertension Mother   . Hypertension Sister   . Colon cancer Mother     History   Social History  . Marital Status: Widowed    Spouse Name: N/A    Number of Children: N/A  . Years of Education: N/A   Occupational History  . Not on file.   Social History Main Topics  . Smoking status: Never Smoker   . Smokeless tobacco: Never Used  . Alcohol Use: No  . Drug Use: No  . Sexually Active: No   Other Topics Concern  . Not on file    Social History Narrative  . No narrative on file      Physical Exam: There were no vitals taken for this visit. Constitutional: generally well-appearing Psychiatric: alert and oriented x3 Abdomen: soft, nontender, nondistended, no obvious ascites, no peritoneal signs, normal bowel sounds     Assessment and plan: 72 y.o. female with duodenal polyp, for EUS, +/- polyp removal  As above

## 2012-11-22 ENCOUNTER — Encounter (HOSPITAL_COMMUNITY): Payer: Self-pay | Admitting: Gastroenterology

## 2012-11-22 NOTE — Telephone Encounter (Signed)
Records have been faxed and appt made

## 2012-11-22 NOTE — Telephone Encounter (Signed)
02/02/13 1 pm  [address] 3 rd floor [phone] attn Crystal

## 2012-12-06 ENCOUNTER — Telehealth: Payer: Self-pay | Admitting: Gastroenterology

## 2012-12-06 NOTE — Telephone Encounter (Signed)
Forward 4 pages from [facility] to Dr. Rob Bunting for review on 12-06-12 ym

## 2012-12-07 ENCOUNTER — Telehealth: Payer: Self-pay | Admitting: Gastroenterology

## 2012-12-07 NOTE — Telephone Encounter (Signed)
Forward 5 pages from [facility] to Dr. Rob Bunting for review on 12-07-12 ym

## 2013-04-25 HISTORY — PX: ESOPHAGOGASTRODUODENOSCOPY: SHX1529

## 2013-05-01 ENCOUNTER — Telehealth: Payer: Self-pay | Admitting: Gastroenterology

## 2013-05-01 NOTE — Telephone Encounter (Signed)
Received 5 pages, from [facility], sent to Dr, Christella Hartigan. 05/01/13/ss

## 2013-05-09 ENCOUNTER — Encounter: Payer: Self-pay | Admitting: Gastroenterology

## 2013-07-09 ENCOUNTER — Encounter (HOSPITAL_COMMUNITY): Payer: Self-pay | Admitting: Emergency Medicine

## 2013-07-09 ENCOUNTER — Emergency Department (HOSPITAL_COMMUNITY)
Admission: EM | Admit: 2013-07-09 | Discharge: 2013-07-09 | Disposition: A | Discharge status: Home / Self Care | Payer: BC Managed Care – PPO | Attending: Emergency Medicine | Admitting: Emergency Medicine

## 2013-07-09 ENCOUNTER — Emergency Department (HOSPITAL_COMMUNITY): Payer: BC Managed Care – PPO

## 2013-07-09 DIAGNOSIS — Z79899 Other long term (current) drug therapy: Secondary | ICD-10-CM | POA: Insufficient documentation

## 2013-07-09 DIAGNOSIS — Z9089 Acquired absence of other organs: Secondary | ICD-10-CM | POA: Insufficient documentation

## 2013-07-09 DIAGNOSIS — Z8739 Personal history of other diseases of the musculoskeletal system and connective tissue: Secondary | ICD-10-CM | POA: Insufficient documentation

## 2013-07-09 DIAGNOSIS — I10 Essential (primary) hypertension: Secondary | ICD-10-CM | POA: Insufficient documentation

## 2013-07-09 DIAGNOSIS — Z9851 Tubal ligation status: Secondary | ICD-10-CM | POA: Insufficient documentation

## 2013-07-09 DIAGNOSIS — R112 Nausea with vomiting, unspecified: Secondary | ICD-10-CM | POA: Insufficient documentation

## 2013-07-09 DIAGNOSIS — K2970 Gastritis, unspecified, without bleeding: Secondary | ICD-10-CM | POA: Insufficient documentation

## 2013-07-09 LAB — COMPREHENSIVE METABOLIC PANEL
ALT: 27 U/L (ref 0–35)
AST: 39 U/L — ABNORMAL HIGH (ref 0–37)
Albumin: 3.5 g/dL (ref 3.5–5.2)
Alkaline Phosphatase: 96 U/L (ref 39–117)
BUN: 8 mg/dL (ref 6–23)
Chloride: 102 mEq/L (ref 96–112)
Potassium: 3.5 mEq/L (ref 3.5–5.1)
Sodium: 136 mEq/L (ref 135–145)
Total Bilirubin: 0.2 mg/dL — ABNORMAL LOW (ref 0.3–1.2)
Total Protein: 7.5 g/dL (ref 6.0–8.3)

## 2013-07-09 LAB — OCCULT BLOOD, POC DEVICE: Fecal Occult Bld: NEGATIVE

## 2013-07-09 LAB — URINALYSIS, ROUTINE W REFLEX MICROSCOPIC
Bilirubin Urine: NEGATIVE
Glucose, UA: NEGATIVE mg/dL
Hgb urine dipstick: NEGATIVE
Ketones, ur: NEGATIVE mg/dL
Nitrite: NEGATIVE
Specific Gravity, Urine: 1.014 (ref 1.005–1.030)
pH: 7 (ref 5.0–8.0)

## 2013-07-09 LAB — CBC WITH DIFFERENTIAL/PLATELET
Basophils Absolute: 0.1 10*3/uL (ref 0.0–0.1)
Basophils Relative: 1 % (ref 0–1)
Eosinophils Absolute: 0.2 10*3/uL (ref 0.0–0.7)
Hemoglobin: 12.4 g/dL (ref 12.0–15.0)
MCH: 27.5 pg (ref 26.0–34.0)
MCHC: 33.3 g/dL (ref 30.0–36.0)
Monocytes Relative: 8 % (ref 3–12)
Neutro Abs: 4.4 10*3/uL (ref 1.7–7.7)
Neutrophils Relative %: 65 % (ref 43–77)
Platelets: 281 10*3/uL (ref 150–400)

## 2013-07-09 LAB — TYPE AND SCREEN: Antibody Screen: NEGATIVE

## 2013-07-09 LAB — ABO/RH: ABO/RH(D): O POS

## 2013-07-09 MED ORDER — PANTOPRAZOLE SODIUM 20 MG PO TBEC: 20.0000 mg | DELAYED_RELEASE_TABLET | Freq: Every day | ORAL | Status: DC

## 2013-07-09 MED ORDER — ONDANSETRON 4 MG PO TBDP: ORAL_TABLET | ORAL | Status: DC

## 2013-07-09 MED ORDER — GI COCKTAIL ~~LOC~~
30.0000 mL | Freq: Once | ORAL | Status: AC
  Administered 2013-07-09: 30 mL via ORAL
  Filled 2013-07-09: qty 30

## 2013-07-09 MED ORDER — ONDANSETRON 4 MG PO TBDP
4.0000 mg | ORAL_TABLET | Freq: Once | ORAL | Status: AC
  Administered 2013-07-09: 4 mg via ORAL
  Filled 2013-07-09: qty 1

## 2013-07-09 NOTE — ED Notes (Signed)
Pt escorted to discharge window. Pt verbalized understanding discharge instructions. In no acute distress.  

## 2013-07-09 NOTE — ED Notes (Signed)
US at bedside

## 2013-07-09 NOTE — ED Notes (Signed)
md at bedside

## 2013-07-09 NOTE — ED Notes (Signed)
Pt states that she has been having abd pain for 1 month (3rd episode this month).  States that she has been having NV and when she had a stool, it was black and tarry.

## 2013-07-09 NOTE — ED Notes (Signed)
Pt alert and oriented x4. Respirations even and unlabored, bilateral symmetrical rise and fall of chest. Skin warm and dry. In no acute distress. Denies needs.   

## 2013-07-09 NOTE — ED Provider Notes (Signed)
CSN: [id number]     Arrival date & time 07/09/13  1109 History     First MD Initiated Contact with Patient 07/09/13 1148     Chief Complaint  Patient presents with  . Abdominal Pain  . Nausea  . Emesis   (Consider location/radiation/quality/duration/timing/severity/associated sxs/prior Treatment) HPI Pt with several weeks of episodic upper abdominal pain associated with vomiting and nausea. Worse after eating. No blood or coffee ground emesis. +dark stool. Admits to using 600 mg of Ibuprofen daily. Prev cholecystectomy and retained bile duct stone. Last episode of pain was this morning approx 45 min after eating oatmeal. Pain is currently improved.  Past Medical History  Diagnosis Date  . Arthritis   . Uncontrolled hypertension, stage 1 08/26/2012   Past Surgical History  Procedure Laterality Date  . Tubal ligation    . Cholecystectomy  october 2012  . Ercp  08/22/2012    Procedure: ENDOSCOPIC RETROGRADE CHOLANGIOPANCREATOGRAPHY (ERCP);  Surgeon: Louis Meckel, MD;  Location: [facility] OR;  Service: Endoscopy;  Laterality: N/A;  . Eus  11/21/2012    Procedure: UPPER ENDOSCOPIC ULTRASOUND (EUS) LINEAR;  Surgeon: Rachael Fee, MD;  Location: [facility] ENDOSCOPY;  Service: Endoscopy;  Laterality: N/A;   Family History  Problem Relation Age of Onset  . Hypertension Mother   . Hypertension Sister   . Colon cancer Mother    History  Substance Use Topics  . Smoking status: Never Smoker   . Smokeless tobacco: Never Used  . Alcohol Use: No   OB History   Grav Para Term Preterm Abortions TAB SAB Ect Mult Living                 Review of Systems  Constitutional: Negative for fever and chills.  Respiratory: Negative for shortness of breath.   Cardiovascular: Negative for chest pain.  Gastrointestinal: Positive for nausea, vomiting and abdominal pain. Negative for diarrhea, constipation and blood in stool.  Genitourinary: Negative for dysuria and flank pain.  Musculoskeletal: Negative for  back pain.  Skin: Negative for rash.  Neurological: Negative for dizziness, weakness, light-headedness, numbness and headaches.  All other systems reviewed and are negative.    Allergies  Codeine  Home Medications   Current Outpatient Rx  Name  Route  Sig  Dispense  Refill  . Calcium Carbonate-Vitamin D (CALCIUM-VITAMIN D) 500-200 MG-UNIT per tablet   Oral   Take 1 tablet by mouth 2 (two) times daily with a meal.         . fish oil-omega-3 fatty acids 1000 MG capsule   Oral   Take 1 g by mouth 3 (three) times daily.         . Multiple Vitamin (MULTIVITAMIN WITH MINERALS) TABS   Oral   Take 1 tablet by mouth daily.         Marland Kitchen omeprazole (PRILOSEC) 20 MG capsule   Oral   Take 20 mg by mouth daily.         . polyethylene glycol (MIRALAX / GLYCOLAX) packet   Oral   Take 17 g by mouth as directed. 3 days on and 3 days off         . ondansetron (ZOFRAN ODT) 4 MG disintegrating tablet      4mg  ODT q4 hours prn nausea/vomit   8 tablet   0   . pantoprazole (PROTONIX) 20 MG tablet   Oral   Take 1 tablet (20 mg total) by mouth daily.   30 tablet   0  BP 129/71  Pulse 67  Temp(Src) 98.3 F (36.8 C) (Oral)  Resp 16  SpO2 99% Physical Exam  Nursing note and vitals reviewed. Constitutional: She is oriented to person, place, and time. She appears well-developed and well-nourished. No distress.  HENT:  Head: Normocephalic and atraumatic.  Mouth/Throat: Oropharynx is clear and moist.  Eyes: EOM are normal. Pupils are equal, round, and reactive to light.  Neck: Normal range of motion. Neck supple.  Cardiovascular: Normal rate and regular rhythm.   Pulmonary/Chest: Effort normal and breath sounds normal. No respiratory distress. She has no wheezes. She has no rales.  Abdominal: Soft. Bowel sounds are normal. She exhibits no distension and no mass. There is tenderness (mild RUQ/LUQ and peigastric TTP.). There is no rebound and no guarding.  Musculoskeletal:  Normal range of motion. She exhibits no edema and no tenderness.  Neurological: She is alert and oriented to person, place, and time.  Skin: Skin is warm and dry. No rash noted. No erythema.  Psychiatric: She has a normal mood and affect. Her behavior is normal.    ED Course   Procedures (including critical care time)  Labs Reviewed  COMPREHENSIVE METABOLIC PANEL - Abnormal; Notable for the following:    AST 39 (*)    Total Bilirubin 0.2 (*)    All other components within normal limits  URINALYSIS, ROUTINE W REFLEX MICROSCOPIC - Abnormal; Notable for the following:    APPearance CLOUDY (*)    All other components within normal limits  CBC WITH DIFFERENTIAL  LIPASE, BLOOD  OCCULT BLOOD, POC DEVICE  TYPE AND SCREEN  ABO/RH   US Abdomen Complete  07/09/2013   *RADIOLOGY REPORT*  Clinical Data:  Epigastric pain, history hypertension  ULTRASOUND ABDOMEN:  Technique:  Sonography of upper abdominal structures was performed.  Comparison:  08/22/2012  Gallbladder:  Surgically absent  Common bile duct:  8 mm diameter, previously 10 mm  Liver:  Normal appearance  IVC:  Normal appearance  Pancreas:  Normal appearance  Spleen:  Normal appearance, 5.1 cm length  Right kidney:  9.3 cm length.  Inferior aspect of inferior pole incompletely visualized.  Visualized portions of right kidney normal appearance.  Left kidney:  10.1 cm length. Normal morphology without mass or hydronephrosis.  Aorta:  Normal caliber  Other:  No free fluid  IMPRESSION: Post cholecystectomy. No acute upper abdominal sonographic abnormalities identified. Decreased size of CBD from 10 mm to 8 mm since previous exam.   Original Report Authenticated By: Ulyses Southward, M.D.   1. Gastritis     MDM  Advised to stop taking NSAID's, start PPI and maalox prn. Return precautions given as well as GI followup.   Loren Racer, MD 07/10/13 [id number]

## 2013-07-21 ENCOUNTER — Telehealth: Payer: Self-pay | Admitting: Gastroenterology

## 2013-07-21 NOTE — Telephone Encounter (Signed)
Pt was referred to [facility] and had EGD June of this year.  Should she call that office?

## 2013-07-21 NOTE — Telephone Encounter (Signed)
She was told in ER to stop NSAIDs and start PPI.  Did she do that?  PPI should be twice daily, if not already and rov with me next available.

## 2013-07-21 NOTE — Telephone Encounter (Signed)
Pt is taking her PPI twice daily and has a follow up with Dr Christella Hartigan

## 2013-07-23 ENCOUNTER — Emergency Department (HOSPITAL_COMMUNITY)
Admission: EM | Admit: 2013-07-23 | Discharge: 2013-07-24 | Disposition: A | Discharge status: Home / Self Care | Payer: BC Managed Care – PPO | Attending: Emergency Medicine | Admitting: Emergency Medicine

## 2013-07-23 ENCOUNTER — Emergency Department (HOSPITAL_COMMUNITY): Payer: BC Managed Care – PPO

## 2013-07-23 ENCOUNTER — Encounter (HOSPITAL_COMMUNITY): Payer: Self-pay | Admitting: Emergency Medicine

## 2013-07-23 DIAGNOSIS — M129 Arthropathy, unspecified: Secondary | ICD-10-CM | POA: Insufficient documentation

## 2013-07-23 DIAGNOSIS — Z79899 Other long term (current) drug therapy: Secondary | ICD-10-CM | POA: Insufficient documentation

## 2013-07-23 DIAGNOSIS — R1013 Epigastric pain: Secondary | ICD-10-CM | POA: Insufficient documentation

## 2013-07-23 DIAGNOSIS — Z9089 Acquired absence of other organs: Secondary | ICD-10-CM | POA: Insufficient documentation

## 2013-07-23 DIAGNOSIS — I10 Essential (primary) hypertension: Secondary | ICD-10-CM | POA: Insufficient documentation

## 2013-07-23 DIAGNOSIS — Z9851 Tubal ligation status: Secondary | ICD-10-CM | POA: Insufficient documentation

## 2013-07-23 DIAGNOSIS — R1110 Vomiting, unspecified: Secondary | ICD-10-CM

## 2013-07-23 DIAGNOSIS — R112 Nausea with vomiting, unspecified: Secondary | ICD-10-CM | POA: Insufficient documentation

## 2013-07-23 LAB — URINE MICROSCOPIC-ADD ON

## 2013-07-23 LAB — COMPREHENSIVE METABOLIC PANEL
AST: 74 U/L — ABNORMAL HIGH (ref 0–37)
Albumin: 3.8 g/dL (ref 3.5–5.2)
Alkaline Phosphatase: 208 U/L — ABNORMAL HIGH (ref 39–117)
BUN: 9 mg/dL (ref 6–23)
CO2: 25 mEq/L (ref 19–32)
Chloride: 99 mEq/L (ref 96–112)
Creatinine, Ser: 0.58 mg/dL (ref 0.50–1.10)
GFR calc non Af Amer: >90 mL/min (ref >90)
Potassium: 3.2 mEq/L — ABNORMAL LOW (ref 3.5–5.1)
Total Bilirubin: 0.4 mg/dL (ref 0.3–1.2)

## 2013-07-23 LAB — CBC WITH DIFFERENTIAL/PLATELET
Basophils Absolute: 0.1 10*3/uL (ref 0.0–0.1)
HCT: 38.4 % (ref 36.0–46.0)
Hemoglobin: 12.8 g/dL (ref 12.0–15.0)
Lymphocytes Relative: 24 % (ref 12–46)
Monocytes Absolute: 0.9 10*3/uL (ref 0.1–1.0)
Monocytes Relative: 12 % (ref 3–12)
Neutro Abs: 5 10*3/uL (ref 1.7–7.7)
Neutrophils Relative %: 62 % (ref 43–77)
WBC: 8 10*3/uL (ref 4.0–10.5)

## 2013-07-23 LAB — URINALYSIS, ROUTINE W REFLEX MICROSCOPIC
Bilirubin Urine: NEGATIVE
Glucose, UA: NEGATIVE mg/dL
Ketones, ur: NEGATIVE mg/dL
Protein, ur: 30 mg/dL — AB
pH: 7 (ref 5.0–8.0)

## 2013-07-23 MED ORDER — IOHEXOL 300 MG/ML  SOLN
100.0000 mL | Freq: Once | INTRAMUSCULAR | Status: AC | PRN
  Administered 2013-07-23: 100 mL via INTRAVENOUS

## 2013-07-23 MED ORDER — HYDROCODONE-ACETAMINOPHEN 5-325 MG PO TABS
1.0000 | ORAL_TABLET | Freq: Once | ORAL | Status: AC
  Administered 2013-07-24: 1 via ORAL
  Filled 2013-07-23: qty 1

## 2013-07-23 MED ORDER — ONDANSETRON HCL 4 MG/2ML IJ SOLN
4.0000 mg | Freq: Once | INTRAMUSCULAR | Status: AC
  Administered 2013-07-23: 4 mg via INTRAVENOUS
  Filled 2013-07-23: qty 2

## 2013-07-23 MED ORDER — IOHEXOL 300 MG/ML  SOLN
50.0000 mL | Freq: Once | INTRAMUSCULAR | Status: AC | PRN
  Administered 2013-07-23: 50 mL via ORAL

## 2013-07-23 MED ORDER — SODIUM CHLORIDE 0.9 % IV SOLN
1000.0000 mL | INTRAVENOUS | Status: DC
  Administered 2013-07-23: 1000 mL via INTRAVENOUS

## 2013-07-23 NOTE — ED Provider Notes (Signed)
CSN: [id number]     Arrival date & time 07/23/13  1941 History   First MD Initiated Contact with Patient 07/23/13 1957     Chief Complaint  Patient presents with  . Abdominal Pain   (Consider location/radiation/quality/duration/timing/severity/associated sxs/prior Treatment) Patient is a 72 y.o. female presenting with abdominal pain. The history is provided by the patient and medical records. No language interpreter was used.  Abdominal Pain Associated symptoms: nausea and vomiting   Associated symptoms: no chest pain, no constipation, no cough, no diarrhea, no dysuria, no fatigue, no fever, no hematuria and no shortness of breath     Teresa Keller is a 72 y.o. female  with a hx of arthritis, HTN presents to the Emergency Department complaining of gradual, intermittent, progressively worsening postprandial epigastric abd pain beginning on 07/09/13.  Pt reports she was seen here in the ED, dx with gastritis from taking too many ibuprofen and dc home with Protonix 20mg  and zofran.  Pt reports she called her GI MD, Dr Christella Hartigan who is unable to see her until Oct 13th.  He recommended taking 40mg  Protonix which she had done without relief.  Pt reports intense epigastric abdominal pain beginning 10 minutes after eating.  It is relieved completely by emesis and not relieved by anything else.  Pt denies taking any further NSAIDs or ASA.  Pt denies fever, chills, headache, neck pain, chest pain, SOB, diarrhea, weakness, dizziness, syncope, dysuria, hematuria.  Pt reports hx of cholecystectomy in 2012 and revision surgery with retained stones in 2013.  Pt also reports polyp of the common bile duct with removal at [facility] in 2013.  Pt continues to see Dr Christella Hartigan for further management of her GI issues.      Past Medical History  Diagnosis Date  . Arthritis   . Uncontrolled hypertension, stage 1 08/26/2012   Past Surgical History  Procedure Laterality Date  . Tubal ligation    . Cholecystectomy  october  2012  . Ercp  08/22/2012    Procedure: ENDOSCOPIC RETROGRADE CHOLANGIOPANCREATOGRAPHY (ERCP);  Surgeon: Louis Meckel, MD;  Location[facility] OR;  Service: Endoscopy;  Laterality: N/A;  . Eus  11/21/2012    Procedure: UPPER ENDOSCOPIC ULTRASOUND (EUS) LINEAR;  Surgeon: Rachael Fee, MD;  Location: [facility] ENDOSCOPY;  Service: Endoscopy;  Laterality: N/A;   Family History  Problem Relation Age of Onset  . Hypertension Mother   . Hypertension Sister   . Colon cancer Mother    History  Substance Use Topics  . Smoking status: Never Smoker   . Smokeless tobacco: Never Used  . Alcohol Use: No   OB History   Grav Para Term Preterm Abortions TAB SAB Ect Mult Living                 Review of Systems  Constitutional: Negative for fever, diaphoresis, appetite change, fatigue and unexpected weight change.  HENT: Negative for mouth sores and neck stiffness.   Eyes: Negative for visual disturbance.  Respiratory: Negative for cough, chest tightness, shortness of breath and wheezing.   Cardiovascular: Negative for chest pain.  Gastrointestinal: Positive for nausea, vomiting and abdominal pain. Negative for diarrhea and constipation.  Endocrine: Negative for polydipsia, polyphagia and polyuria.  Genitourinary: Negative for dysuria, urgency, frequency and hematuria.  Musculoskeletal: Negative for back pain.  Skin: Negative for rash.  Allergic/Immunologic: Negative for immunocompromised state.  Neurological: Negative for syncope, light-headedness and headaches.  Hematological: Does not bruise/bleed easily.  Psychiatric/Behavioral: Negative for sleep disturbance. The  patient is not nervous/anxious.     Allergies  Codeine  Home Medications   Current Outpatient Rx  Name  Route  Sig  Dispense  Refill  . Calcium Carbonate-Vitamin D (CALCIUM-VITAMIN D) 500-200 MG-UNIT per tablet   Oral   Take 1 tablet by mouth 2 (two) times daily with a meal.         . fish oil-omega-3 fatty acids 1000 MG  capsule   Oral   Take 1 g by mouth 3 (three) times daily.         . Multiple Vitamin (MULTIVITAMIN WITH MINERALS) TABS   Oral   Take 1 tablet by mouth daily.         Marland Kitchen omeprazole (PRILOSEC) 20 MG capsule   Oral   Take 20 mg by mouth daily.         . ondansetron (ZOFRAN ODT) 4 MG disintegrating tablet      4mg  ODT q4 hours prn nausea/vomit   8 tablet   0   . pantoprazole (PROTONIX) 20 MG tablet   Oral   Take 1 tablet (20 mg total) by mouth daily.   30 tablet   0   . polyethylene glycol (MIRALAX / GLYCOLAX) packet   Oral   Take 17 g by mouth as directed. 3 days on and 3 days off          BP 111/62  Pulse 85  Temp(Src) 98.6 F (37 C) (Oral)  Resp 18  Ht 5\' 2"  (1.575 m)  Wt 146 lb (66.225 kg)  BMI 26.7 kg/m2  SpO2 98% Physical Exam  Nursing note and vitals reviewed. Constitutional: She is oriented to person, place, and time. She appears well-developed and well-nourished.  HENT:  Head: Normocephalic and atraumatic.  Mouth/Throat: Oropharynx is clear and moist.  Eyes: Conjunctivae are normal. Pupils are equal, round, and reactive to light. No scleral icterus.  Neck: Normal range of motion. Neck supple.  Cardiovascular: Normal rate, regular rhythm, normal heart sounds and intact distal pulses.   No murmur heard. Pulmonary/Chest: Effort normal and breath sounds normal. No respiratory distress. She has no wheezes.  Abdominal: Soft. Normal appearance and bowel sounds are normal. She exhibits no distension and no mass. There is no tenderness. There is no rigidity, no rebound, no guarding and no CVA tenderness.  Musculoskeletal: Normal range of motion. She exhibits no edema.  Lymphadenopathy:    She has no cervical adenopathy.  Neurological: She is alert and oriented to person, place, and time. She exhibits normal muscle tone. Coordination normal.  Skin: Skin is warm and dry. No rash noted. No erythema.  Psychiatric: She has a normal mood and affect. Her behavior is  normal.    ED Course  Procedures (including critical care time) Labs Review Labs Reviewed  COMPREHENSIVE METABOLIC PANEL - Abnormal; Notable for the following:    Potassium 3.2 (*)    Glucose, Bld 105 (*)    AST 74 (*)    ALT 69 (*)    Alkaline Phosphatase 208 (*)    All other components within normal limits  URINALYSIS, ROUTINE W REFLEX MICROSCOPIC - Abnormal; Notable for the following:    Protein, ur 30 (*)    Leukocytes, UA SMALL (*)    All other components within normal limits  CBC WITH DIFFERENTIAL  LIPASE, BLOOD  URINE MICROSCOPIC-ADD ON   Imaging Review Ct Abdomen Pelvis W Contrast  07/23/2013   *RADIOLOGY REPORT*  Clinical Data: Post prandial pain.  History cholecystectomy.  CT ABDOMEN AND PELVIS WITH CONTRAST  Technique:  Multidetector CT imaging of the abdomen and pelvis was performed following the standard protocol during bolus administration of intravenous contrast.  Contrast: OMNIPAQUE IOHEXOL 300 MG/ML  SOLN  Comparison: CT 08/18/2012  Findings: Lung bases are clear.  No pericardial fluid.  No focal hepatic lesion.  Post cholecystectomy.  No biliary duct dilatation. The common bile duct is upper limits of normal 6 mm. There is a low density focus within the common bile duct measuring approximately 4 mm seen on axial image 27 and coronal image 47.  It is conceivable this could represent a cholesterol stone in the common bile duct.  No pancreatic inflammation.  The spleen, adrenal glands, and kidneys are normal.  The stomach, small bowel, and cecum are normal.  The colon demonstrates several diverticula in the sigmoid region.  Abdominal aorta normal caliber.  No retroperitoneal periportal lymphadenopathy.  No free fluid the pelvis.  Uterus and ovaries are normal.  No pelvic lymphadenopathy.  Review of  bone windows demonstrates no aggressive osseous lesions.  IMPRESSION:  1.  No acute abdominal pelvic findings.  2.  Small low density lesion within the distal common bile duct.  Cannot exclude a cholesterol stone within the common bile duct.  If the patient has any biliary type symptoms, consider MRCP for further evaluation. 2.  Sigmoid diverticulosis without evidence of diverticulitis.   Original Report Authenticated By: Genevive Bi, M.D.    MDM   1. Postprandial epigastric pain   2. Postprandial vomiting      Teresa Keller presents with c/o post prandial abd pain and vomiting resolved by the time my evaluation began.  Will obtain labs, give fluids and CT scan.  Pt afebrile, nontoxic, nonseptic appearing in NAD.  Pt without pain at this time and resting comfortably.    Patient is nontoxic, nonseptic appearing, in no apparent distress.  Patient's pain and other symptoms adequately managed in emergency department.  Fluid bolus given.  Labs, imaging and vitals reviewed.  Should without leukocytosis, UA without evidence of urinary tract infection and CMP with only mildly elevated AST and ALT. Patient bilirubin normal and lipase within normal limits.  CT scan with small, low-density lesion within the distal common bile duct; cannot exclude cholesterol stone.  I personally reviewed the imaging tests through PACS system.  I reviewed available ER/hospitalization records through the EMR.  At this point the patient needs an MRCP for further evaluation.    Discussed with Dr Russella Dar, of [facility] who agrees that patient needs to be seen in the office this week.  He agrees to let Dr Christella Hartigan know about this patient in an effort to get her worked in this week.    Patient does not meet the SIRS or Sepsis criteria.  On repeat exam patient does not have a surgical abdomin and there are no peritoneal signs.  No indication of appendicitis, bowel obstruction, bowel perforation, diverticulitis.  Patient discharged and given strict instructions for follow-up with their GI by calling after 8am tomorrow.  I have also discussed reasons to return immediately to the ER.  Patient expresses  understanding and agrees with plan.  It has been determined that no acute conditions requiring further emergency intervention are present at this time. The patient/guardian have been advised of the diagnosis and plan. We have discussed signs and symptoms that warrant return to the ED, such as changes or worsening in symptoms.   Vital signs are stable at discharge.  BP 111/62  Pulse 85  Temp(Src) 98.6 F (37 C) (Oral)  Resp 18  Ht 5\' 2"  (1.575 m)  Wt 146 lb (66.225 kg)  BMI 26.7 kg/m2  SpO2 98%  Patient/guardian has voiced understanding and agreed to follow-up with the PCP or specialist.  Dr. Linwood Dibbles was consulted, evaluated this patient with me and agrees with the plan.              Teresa Client Jennife Zaucha, PA-C 07/24/13 0008

## 2013-07-23 NOTE — ED Notes (Signed)
Pt states that she has had Nausea and vomiting and sharp abd pains after eating since 8/24; pt states that it only happens after she eats and symptoms subside shortly after.

## 2013-07-23 NOTE — ED Notes (Signed)
Pt c/o mid abd pain,severe abd pain after eating onset 8/24. Pt states after she vomits pain resolves. Pt rocking in chair, nausea.

## 2013-07-24 ENCOUNTER — Telehealth: Payer: Self-pay | Admitting: Gastroenterology

## 2013-07-24 NOTE — ED Provider Notes (Signed)
Medical screening examination/treatment/procedure(s) were conducted as a shared visit with non-physician practitioner(s) and myself.  I personally evaluated the patient during the encounter  Pt has had recurrent episodes of vomiting and nausea, post prandial.  ? Findings noted in CBD however pt has normal bilirubin.  Plan on close follow up with GI as outpatient.     Celene Kras, MD 07/24/13 [phone]

## 2013-07-24 NOTE — Telephone Encounter (Signed)
    Per ED note:  At this point the patient needs an MRCP for further evaluation.  Discussed with Dr Russella Dar, of [facility] who agrees that patient needs to be seen in the office this week. He agrees to let Dr Christella Hartigan know about this patient in an effort to get her worked in this week.  Pt was added to [facility] schedule for 07/25/13 pt aware

## 2013-07-25 ENCOUNTER — Ambulatory Visit (INDEPENDENT_AMBULATORY_CARE_PROVIDER_SITE_OTHER): Payer: BC Managed Care – PPO | Admitting: Nurse Practitioner

## 2013-07-25 ENCOUNTER — Encounter: Payer: Self-pay | Admitting: Nurse Practitioner

## 2013-07-25 VITALS — BP 132/68 | HR 70 | Ht 62.0 in | Wt 150.0 lb

## 2013-07-25 DIAGNOSIS — R112 Nausea with vomiting, unspecified: Secondary | ICD-10-CM | POA: Insufficient documentation

## 2013-07-25 DIAGNOSIS — R933 Abnormal findings on diagnostic imaging of other parts of digestive tract: Secondary | ICD-10-CM

## 2013-07-25 DIAGNOSIS — R1013 Epigastric pain: Secondary | ICD-10-CM

## 2013-07-25 NOTE — Progress Notes (Signed)
History of Present Illness:  Patient is a 72 year old female, known to Dr. Christella Hartigan, who underwent lap chole October 2012 and ERCP with biliary sphincterotomy and small stone extraction October 2013. 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 Patient was referred to Dr. Othelia Pulling at [facility] who removed the adenoma January 2014. I don't have results but repeat EGD in June was normal per patient.   Since August patient has been having nausea, vomiting and recurrent upper abdominal pain reminiscent of biliary pain. Pain is non-radiating, much worse with meals, she is scared to eat. Symptoms start within 10 minutes of eating, lasts about 45 minutes. She was evaluated in ED 8/24 at which time U/S and LFTs were normal. Patient was advised to stop NSAIDS and take BID PPI. She went back to ED two days ago. Alk phos elevated at 208, bili normal, transaminases mildly elevated, lipase normal. CTscan revealed small low density lesion within the distal common  .  Current Medications, Allergies, Past Medical History, Past Surgical History, Family History and Social History were reviewed in [employer] record.  US Abdomen Complete  07/09/2013   *RADIOLOGY REPORT*  Clinical Data:  Epigastric pain, history hypertension  ULTRASOUND ABDOMEN:  Technique:  Sonography of upper abdominal structures was performed.  Comparison:  08/22/2012  Gallbladder:  Surgically absent  Common bile duct:  8 mm diameter, previously 10 mm  Liver:  Normal appearance  IVC:  Normal appearance  Pancreas:  Normal appearance  Spleen:  Normal appearance, 5.1 cm length  Right kidney:  9.3 cm length.  Inferior aspect of inferior pole incompletely visualized.  Visualized portions of right kidney normal appearance.  Left kidney:  10.1 cm length. Normal morphology without mass or hydronephrosis.  Aorta:  Normal caliber  Other:  No free fluid  IMPRESSION: Post cholecystectomy. No acute  upper abdominal sonographic abnormalities identified. Decreased size of CBD from 10 mm to 8 mm since previous exam.   Original Report Authenticated By: Ulyses Southward, M.D.   Ct Abdomen Pelvis W Contrast  07/23/2013   *RADIOLOGY REPORT*  Clinical Data: Post prandial pain.  History cholecystectomy.  CT ABDOMEN AND PELVIS WITH CONTRAST  Technique:  Multidetector CT imaging of the abdomen and pelvis was performed following the standard protocol during bolus administration of intravenous contrast.  Contrast: OMNIPAQUE IOHEXOL 300 MG/ML  SOLN  Comparison: CT 08/18/2012  Findings: Lung bases are clear.  No pericardial fluid.  No focal hepatic lesion.  Post cholecystectomy.  No biliary duct dilatation. The common bile duct is upper limits of normal 6 mm. There is a low density focus within the common bile duct measuring approximately 4 mm seen on axial image 27 and coronal image 47.  It is conceivable this could represent a cholesterol stone in the common bile duct.  No pancreatic inflammation.  The spleen, adrenal glands, and kidneys are normal.  The stomach, small bowel, and cecum are normal.  The colon demonstrates several diverticula in the sigmoid region.  Abdominal aorta normal caliber.  No retroperitoneal periportal lymphadenopathy.  No free fluid the pelvis.  Uterus and ovaries are normal.  No pelvic lymphadenopathy.  Review of  bone windows demonstrates no aggressive osseous lesions.  IMPRESSION:  1.  No acute abdominal pelvic findings.  2.  Small low density lesion within the distal common bile duct. Cannot exclude a cholesterol stone within the common bile duct.  If the patient has any biliary type  symptoms, consider MRCP for further evaluation. 2.  Sigmoid diverticulosis without evidence of diverticulitis.   Original Report Authenticated By: Genevive Bi, M.D.   Physical Exam: General: Well developed , black female in no acute distress Head: Normocephalic and atraumatic Eyes:  sclerae anicteric,  conjunctiva pink  Ears: Normal auditory acuity Lungs: Clear throughout to auscultation Heart: Regular rate and rhythm Abdomen: Soft, non distended, minimal epigastric tenderness. No masses, no hepatomegaly. Normal bowel sounds Musculoskeletal: Symmetrical with no gross deformities  Extremities: No edema  Neurological: Alert oriented x 4, grossly nonfocal Psychological:  Alert and cooperative. Normal mood and affect  Assessment and Recommendations:  72 year old female with nausea, vomiting, and epigastric pain reminiscent of bilary pain. Though bili normal, her alk phos has risen from 96 to 208 with rising transaminases and CTscan revealing distal CBD lesion. Rule out recurrent CBD stone though she had a sphincterotomy in Oct 2013. For further evaluation patient will be scheduled for MRCP. Will call her with results and further recommendations

## 2013-07-25 NOTE — Patient Instructions (Addendum)
You have been scheduled for an MRCP at [facility] on Monday 07-31-2013 Your appointment time is 8:00 am  Please arrive at 7:45 am  to your appointment time for registration purposes. There is no prep for this test. However, if you have any metal in your body, have a pacemaker or defibrillator, please be sure to let your ordering physician know. This test typically takes 45 minutes to 1 hour to complete.  Have nothing by mouth after midnight.   You can eat what you can tolerate.  Drink some Ensure or boost also for nutrition.

## 2013-07-27 ENCOUNTER — Encounter (HOSPITAL_COMMUNITY): Payer: Self-pay

## 2013-07-27 ENCOUNTER — Inpatient Hospital Stay (HOSPITAL_COMMUNITY)
Admission: EM | Admit: 2013-07-27 | Discharge: 2013-08-01 | DRG: 188 | Disposition: A | Discharge status: Home / Self Care | Payer: BC Managed Care – PPO | Attending: Internal Medicine | Admitting: Internal Medicine

## 2013-07-27 DIAGNOSIS — I10 Essential (primary) hypertension: Secondary | ICD-10-CM | POA: Diagnosis present

## 2013-07-27 DIAGNOSIS — K9186 Retained cholelithiasis following cholecystectomy: Principal | ICD-10-CM | POA: Diagnosis present

## 2013-07-27 DIAGNOSIS — K8050 Calculus of bile duct without cholangitis or cholecystitis without obstruction: Secondary | ICD-10-CM

## 2013-07-27 DIAGNOSIS — D132 Benign neoplasm of duodenum: Secondary | ICD-10-CM | POA: Diagnosis present

## 2013-07-27 DIAGNOSIS — R933 Abnormal findings on diagnostic imaging of other parts of digestive tract: Secondary | ICD-10-CM

## 2013-07-27 DIAGNOSIS — R1013 Epigastric pain: Secondary | ICD-10-CM

## 2013-07-27 DIAGNOSIS — Z23 Encounter for immunization: Secondary | ICD-10-CM

## 2013-07-27 DIAGNOSIS — R112 Nausea with vomiting, unspecified: Secondary | ICD-10-CM | POA: Diagnosis present

## 2013-07-27 DIAGNOSIS — M129 Arthropathy, unspecified: Secondary | ICD-10-CM | POA: Diagnosis present

## 2013-07-27 DIAGNOSIS — E876 Hypokalemia: Secondary | ICD-10-CM | POA: Diagnosis present

## 2013-07-27 MED ORDER — ONDANSETRON HCL 4 MG/2ML IJ SOLN
4.0000 mg | Freq: Once | INTRAMUSCULAR | Status: AC
  Administered 2013-07-27: 4 mg via INTRAVENOUS
  Filled 2013-07-27: qty 2

## 2013-07-27 MED ORDER — SODIUM CHLORIDE 0.9 % IV SOLN
INTRAVENOUS | Status: DC
  Administered 2013-07-27: via INTRAVENOUS
  Administered 2013-07-28: 1000 mL via INTRAVENOUS

## 2013-07-27 MED ORDER — HYDROMORPHONE HCL PF 1 MG/ML IJ SOLN
1.0000 mg | Freq: Once | INTRAMUSCULAR | Status: AC
  Administered 2013-07-27: 1 mg via INTRAVENOUS
  Filled 2013-07-27: qty 1

## 2013-07-27 NOTE — ED Notes (Signed)
Bed: ZO10 Expected date: 07/27/13 Expected time: 10:15 PM Means of arrival: Ambulance Comments: 72 yo F  Pain

## 2013-07-27 NOTE — Progress Notes (Signed)
i agree with the plan above 

## 2013-07-27 NOTE — ED Provider Notes (Signed)
CSN: [id number]     Arrival date & time 07/27/13  2242 History   First MD Initiated Contact with Patient 07/27/13 2305     Chief Complaint  Patient presents with  . Abdominal Pain   (Consider location/radiation/quality/duration/timing/severity/associated sxs/prior Treatment) Patient is a 72 y.o. female presenting with abdominal pain. The history is provided by the patient.  Abdominal Pain Pain location:  Epigastric Pain quality: burning and sharp   Pain radiates to:  RUQ Pain severity:  Moderate Onset quality:  Sudden Duration:  2 weeks Timing:  Sporadic Progression:  Worsening Chronicity:  Recurrent   Sx start when pt eats food--scheduled for MRCP next week, ate this pm and pain became worse, no tx used pta--denies very, emesis non bilious--no diarrhea or bloody stools  Past Medical History  Diagnosis Date  . Arthritis   . Uncontrolled hypertension, stage 1 08/26/2012   Past Surgical History  Procedure Laterality Date  . Tubal ligation    . Cholecystectomy  october 2012  . Ercp  08/22/2012    Procedure: ENDOSCOPIC RETROGRADE CHOLANGIOPANCREATOGRAPHY (ERCP);  Surgeon: Louis Meckel, MD;  Location: [facility] OR;  Service: Endoscopy;  Laterality: N/A;  . Eus  11/21/2012    Procedure: UPPER ENDOSCOPIC ULTRASOUND (EUS) LINEAR;  Surgeon: Rachael Fee, MD;  Location: [facility] ENDOSCOPY;  Service: Endoscopy;  Laterality: N/A;   Family History  Problem Relation Age of Onset  . Hypertension Mother   . Hypertension Sister   . Colon cancer Mother    History  Substance Use Topics  . Smoking status: Never Smoker   . Smokeless tobacco: Never Used  . Alcohol Use: No   OB History   Grav Para Term Preterm Abortions TAB SAB Ect Mult Living                 Review of Systems  Gastrointestinal: Positive for abdominal pain.  All other systems reviewed and are negative.    Allergies  Codeine and Sulfur  Home Medications   Current Outpatient Rx  Name  Route  Sig  Dispense  Refill  .  Calcium Carbonate-Vitamin D (CALCIUM-VITAMIN D) 500-200 MG-UNIT per tablet   Oral   Take 1 tablet by mouth 2 (two) times daily with a meal.         . fish oil-omega-3 fatty acids 1000 MG capsule   Oral   Take 1 g by mouth 3 (three) times daily.         . Multiple Vitamin (MULTIVITAMIN WITH MINERALS) TABS   Oral   Take 1 tablet by mouth daily.         Marland Kitchen omeprazole (PRILOSEC) 20 MG capsule   Oral   Take 20 mg by mouth daily.         . pantoprazole (PROTONIX) 20 MG tablet   Oral   Take 1 tablet (20 mg total) by mouth daily.   30 tablet   0   . polyethylene glycol (MIRALAX / GLYCOLAX) packet   Oral   Take 17 g by mouth as directed. 3 days on and 3 days off          There were no vitals taken for this visit. Physical Exam  Nursing note and vitals reviewed. Constitutional: She is oriented to person, place, and time. She appears well-developed and well-nourished.  Non-toxic appearance. No distress.  HENT:  Head: Normocephalic and atraumatic.  Eyes: Conjunctivae, EOM and lids are normal. Pupils are equal, round, and reactive to light.  Neck: Normal range  of motion. Neck supple. No tracheal deviation present. No mass present.  Cardiovascular: Normal rate, regular rhythm and normal heart sounds.  Exam reveals no gallop.   No murmur heard. Pulmonary/Chest: Effort normal and breath sounds normal. No stridor. No respiratory distress. She has no decreased breath sounds. She has no wheezes. She has no rhonchi. She has no rales.  Abdominal: Soft. Normal appearance and bowel sounds are normal. She exhibits no distension. There is tenderness in the right upper quadrant and epigastric area. There is no rigidity, no rebound, no guarding and no CVA tenderness.  Musculoskeletal: Normal range of motion. She exhibits no edema and no tenderness.  Neurological: She is alert and oriented to person, place, and time. She has normal strength. No cranial nerve deficit or sensory deficit. GCS eye  subscore is 4. GCS verbal subscore is 5. GCS motor subscore is 6.  Skin: Skin is warm and dry. No abrasion and no rash noted.  Psychiatric: She has a normal mood and affect. Her speech is normal and behavior is normal.    ED Course  Procedures (including critical care time) Labs Review Labs Reviewed  CBC WITH DIFFERENTIAL  COMPREHENSIVE METABOLIC PANEL  LIPASE, BLOOD   Imaging Review No results found.  MDM  No diagnosis found. Patient given IV fluids and pain medication here. Will be transferred to [facility] for admission for GI evaluation in the morning    Toy Baker, MD 07/28/13 [phone]

## 2013-07-27 NOTE — ED Notes (Signed)
Per EMS: Pt c/o of ab pain starting one hour ago. Relief of pain after vomiting x1. MRI scheduled. Gallstones removed 2013.

## 2013-07-28 ENCOUNTER — Encounter (HOSPITAL_COMMUNITY): Payer: Self-pay | Admitting: Internal Medicine

## 2013-07-28 DIAGNOSIS — R112 Nausea with vomiting, unspecified: Secondary | ICD-10-CM

## 2013-07-28 DIAGNOSIS — I10 Essential (primary) hypertension: Secondary | ICD-10-CM

## 2013-07-28 DIAGNOSIS — R7989 Other specified abnormal findings of blood chemistry: Secondary | ICD-10-CM

## 2013-07-28 DIAGNOSIS — R1013 Epigastric pain: Secondary | ICD-10-CM

## 2013-07-28 DIAGNOSIS — R933 Abnormal findings on diagnostic imaging of other parts of digestive tract: Secondary | ICD-10-CM

## 2013-07-28 DIAGNOSIS — E876 Hypokalemia: Secondary | ICD-10-CM | POA: Diagnosis present

## 2013-07-28 LAB — CBC WITH DIFFERENTIAL/PLATELET
Basophils Absolute: 0 10*3/uL (ref 0.0–0.1)
Eosinophils Relative: 1 % (ref 0–5)
HCT: 37.5 % (ref 36.0–46.0)
Hemoglobin: 12.9 g/dL (ref 12.0–15.0)
Lymphocytes Relative: 11 % — ABNORMAL LOW (ref 12–46)
Lymphs Abs: 1 10*3/uL (ref 0.7–4.0)
MCV: 81.7 fL (ref 78.0–100.0)
Monocytes Absolute: 0.8 10*3/uL (ref 0.1–1.0)
Monocytes Relative: 9 % (ref 3–12)
Neutro Abs: 6.7 10*3/uL (ref 1.7–7.7)
RBC: 4.59 MIL/uL (ref 3.87–5.11)
WBC: 8.6 10*3/uL (ref 4.0–10.5)

## 2013-07-28 LAB — COMPREHENSIVE METABOLIC PANEL
ALT: 61 U/L — ABNORMAL HIGH (ref 0–35)
AST: 106 U/L — ABNORMAL HIGH (ref 0–37)
Alkaline Phosphatase: 162 U/L — ABNORMAL HIGH (ref 39–117)
BUN: 5 mg/dL — ABNORMAL LOW (ref 6–23)
CO2: 23 mEq/L (ref 19–32)
CO2: 26 mEq/L (ref 19–32)
Calcium: 9.5 mg/dL (ref 8.4–10.5)
Chloride: 102 mEq/L (ref 96–112)
Chloride: 99 mEq/L (ref 96–112)
Creatinine, Ser: 0.55 mg/dL (ref 0.50–1.10)
GFR calc Af Amer: >90 mL/min (ref >90)
GFR calc Af Amer: >90 mL/min (ref >90)
GFR calc non Af Amer: >90 mL/min (ref >90)
GFR calc non Af Amer: >90 mL/min (ref >90)
Glucose, Bld: 104 mg/dL — ABNORMAL HIGH (ref 70–99)
Glucose, Bld: 95 mg/dL (ref 70–99)
Potassium: 4 mEq/L (ref 3.5–5.1)
Sodium: 136 mEq/L (ref 135–145)
Total Bilirubin: 0.3 mg/dL (ref 0.3–1.2)
Total Bilirubin: 0.3 mg/dL (ref 0.3–1.2)

## 2013-07-28 MED ORDER — DEXTROSE-NACL 5-0.9 % IV SOLN
INTRAVENOUS | Status: DC
  Administered 2013-07-28: 13:00:00 1000 mL via INTRAVENOUS
  Administered 2013-07-29 – 2013-08-01 (×7): via INTRAVENOUS

## 2013-07-28 MED ORDER — POTASSIUM CHLORIDE CRYS ER 20 MEQ PO TBCR
40.0000 meq | EXTENDED_RELEASE_TABLET | Freq: Once | ORAL | Status: AC
  Administered 2013-07-28: 40 meq via ORAL
  Filled 2013-07-28: qty 2

## 2013-07-28 MED ORDER — PANTOPRAZOLE SODIUM 40 MG PO TBEC: 40.0000 mg | DELAYED_RELEASE_TABLET | Freq: Two times a day (BID) | ORAL | Status: DC

## 2013-07-28 MED ORDER — PANTOPRAZOLE SODIUM 40 MG PO TBEC
40.0000 mg | DELAYED_RELEASE_TABLET | Freq: Every day | ORAL | Status: DC
  Administered 2013-07-28 – 2013-08-01 (×4): 40 mg via ORAL
  Filled 2013-07-28 (×4): qty 1

## 2013-07-28 MED ORDER — INFLUENZA VAC SPLIT QUAD 0.5 ML IM SUSP
0.5000 mL | INTRAMUSCULAR | Status: AC
  Administered 2013-07-29: 10:00:00 0.5 mL via INTRAMUSCULAR
  Filled 2013-07-28: qty 0.5

## 2013-07-28 MED ORDER — ENOXAPARIN SODIUM 40 MG/0.4ML ~~LOC~~ SOLN
40.0000 mg | SUBCUTANEOUS | Status: DC
  Administered 2013-07-28 – 2013-07-29 (×2): 40 mg via SUBCUTANEOUS
  Filled 2013-07-28 (×3): qty 0.4

## 2013-07-28 MED ORDER — MORPHINE SULFATE 2 MG/ML IJ SOLN
1.0000 mg | Freq: Once | INTRAMUSCULAR | Status: AC
  Administered 2013-07-28: 1 mg via INTRAVENOUS
  Filled 2013-07-28: qty 1

## 2013-07-28 NOTE — H&P (Signed)
  Date: 07/28/2013  Patient name: Teresa Keller  Medical record number: [id number]  Date of birth: 05-31-1941   This patient has been seen and the plan of care was discussed with the house staff. Please see their note for complete details. I concur with their findings with the following additions/corrections: Patient seen and examined. Agree with Gi consultation to determine need for ERCP vs MRCP given her history and her abdominal symptoms worsened by eating.  She has a recent CT with evidence of a possible cholesterol stone.  Stricture vs malignancy are also possibilities.  Keep NPO.  Would change her fluids to D5NS at 100 cc/hr while NPO.  Jonah Blue, DO, FACP Faculty [facility] Internal Medicine Residency Program 07/28/2013, 12:34 PM

## 2013-07-28 NOTE — Progress Notes (Signed)
Subjective: Complaints of persistent abd pain today.No nausea or vomiting. Has been NPO.  Objective: Vital signs in last 24 hours: Filed Vitals:   07/28/13 0211 07/28/13 0421 07/28/13 0613  BP: 117/65 142/74 114/73  Pulse: 79 72 61  Temp: 98.2 F (36.8 C) 98.6 F (37 C) 97.9 F (36.6 C)  TempSrc: Oral Oral Tympanic  Resp: 18 20 20   Height:  5\' 2"  (1.575 m)   Weight:  145 lb 11.6 oz (66.1 kg)   SpO2: 96% 98% 97%   Weight change:   Intake/Output Summary (Last 24 hours) at 07/28/13 1252 Last data filed at 07/28/13 0800  Gross per 24 hour  Intake 1006.25 ml  Output    200 ml  Net 806.25 ml   .Physical exam-  GENERAL- alert, co-operative, appears as stated age, not in any distress. HEENT- Atraumatic, normocephalic, PERRL, EOMI, oral mucosa moist. CARDIAC- RRR, no murmurs, rubs or gallops. RESP- Moving equal volumes of air, and clear to auscultation bilaterally. ABDOMEN- Soft, nontender,  bowel sounds present. NEURO- Cr N 2-12 intact, strenght equal and present in all extremities EXTREMITIES- pulse 2+, symmetric. SKIN- Warm, dry, No rash or lesion.  Lab Results: Basic Metabolic Panel:  Recent Labs Lab 07/27/13 2352 07/28/13 0555  NA 136 136  K 3.0* 4.0  CL 99 102  CO2 26 23  GLUCOSE 95 104*  BUN 5* 5*  CREATININE 0.55 0.47*  CALCIUM 9.5 9.1   Liver Function Tests:  Recent Labs Lab 07/27/13 2352 07/28/13 0555  AST 106* 60*  ALT 75* 61*  ALKPHOS 195* 162*  BILITOT 0.3 0.3  PROT 7.5 7.0  ALBUMIN 3.6 3.3*    Recent Labs Lab 07/23/13 2020 07/27/13 2352  LIPASE 26 23   CBC:  Recent Labs Lab 07/23/13 2020 07/27/13 2352  WBC 8.0 8.6  NEUTROABS 5.0 6.7  HGB 12.8 12.9  HCT 38.4 37.5  MCV 81.5 81.7  PLT 313 313    Urinalysis:  Recent Labs Lab 07/23/13 2035  COLORURINE YELLOW  LABSPEC 1.023  PHURINE 7.0  GLUCOSEU NEGATIVE  HGBUR NEGATIVE  BILIRUBINUR NEGATIVE  KETONESUR NEGATIVE  PROTEINUR 30*  UROBILINOGEN 1.0  NITRITE NEGATIVE    LEUKOCYTESUR SMALL*   Micro Results: No results found for this or any previous visit (from the past 240 hour(s)). Studies/Results: No results found. Medications: I have reviewed the patient's current medications. Scheduled Meds: . enoxaparin (LOVENOX) injection  40 mg Subcutaneous Q24H  . [START ON 07/29/2013] influenza vac split quadrivalent PF  0.5 mL Intramuscular Tomorrow-1000  . pantoprazole  40 mg Oral Daily   Continuous Infusions: . sodium chloride 1,000 mL (07/28/13 0923)   PRN Meds:. Assessment/Plan: Principal Problem:   Abdominal pain, epigastric Active Problems:   Benign neoplasm of duodenum   Hypokalemia   Elevated LFTs  # Abdominal Pain with elevated transaminases-   Likely due to obstruction of the common bile duct. The etiology of the obstruction is uncertain. Per recent CT, a cholesterol stone is a possibility. ERCP in June 2014 showed a ampulla with normal with no adenomatous tissue at the prior polypectomy site. The obstruction- malignancy versus stricture.   - Consult GI for MRCP/ ERCP - NPO  - Change IVFs D5/Ns 148mls/hr while NPO. - PPI PO BID  -  # Hypokalemia  - On admission- K of 3.0, was given,k- , BMP this Am, K- 4.0.  # DTV Proph: Lovenox .  Dispo: Disposition is deferred at this time, awaiting improvement of current medical problems.  Anticipated discharge in approximately 1-2 day(s).   The patient does have a current PCP (No Pcp Per Patient) and does need an [facility] hospital follow-up appointment after discharge.  The patient does not know have transportation limitations that hinder transportation to clinic appointments.  .Services Needed at time of discharge: Y = Yes, Blank = No PT:   OT:   RN:   Equipment:   Other:     LOS: 1 day   Kennis Carina, MD 07/28/2013, 12:52 PM

## 2013-07-28 NOTE — H&P (Signed)
Date: 07/28/2013               Patient Name:  Teresa Keller MRN: [id number]  DOB: October 07, 1941 Age / Sex: 72 y.o., female   PCP: No Pcp Per Patient         Medical Service: Internal Medicine Teaching Service         Attending Physician: Dr. Toy Baker, MD    First Contact: Dr. Inocente Salles, MD Pager: [phone]  Second Contact: Dr. Charlsie Merles, MD Pager: [phone]       After Hours (After 5p/  First Contact Pager: [phone]  weekends / holidays): Second Contact Pager: [phone]   Chief Complaint: Abdominal Pain  History of Present Illness: Teresa Keller is a 72 y.o. 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 The patient has had N/V and recurrent abdominal pain since August, The patient states that this pain is similar to her gall bladder pain she had in 2012 and 2013. The pain starts ten minutes after eating and lasts for about 45 minutes.  The patient is afraid to eat due to this pain. No weight loss during this time, but admits to vomitting after numerous meals during the last month. Denies blood in emesis. She sought medical care on Aug 24th in ED for this pain. At that time, abdominal U/S and LFTs were WNLs. She returned to the ED on September 7th for continuation of this pain. CT abdomen  At that visit demonstrated a small low density lesion within the distal common bile duct possibly a cholesterol stone within the common bile duct. Recommended MRCP is the the patient has any biliary type symptoms. She was sent home with an appointment for MRCP on 9-15. However, she returned to the ED on 9/11 due to worsening pain. At this time, her LFTs had worsened. Internal medicine was consulted to admit the patient for expedited MRCP.  Denies constipation diarrhea, headache, dizziness, weakness, chest pain,  SOB.  Meds: Current Facility-Administered Medications  Medication Dose Route Frequency Provider Last Rate Last Dose  . 0.9 %  sodium chloride infusion   Intravenous Continuous Toy Baker, MD 125 mL/hr at 07/27/13 2357    . potassium chloride SA (K-DUR,KLOR-CON) CR tablet 40 mEq  40 mEq Oral Once Toy Baker, MD       Current Outpatient Prescriptions  Medication Sig Dispense Refill  . Calcium Carbonate-Vitamin D (CALCIUM-VITAMIN D) 500-200 MG-UNIT per tablet Take 1 tablet by mouth 2 (two) times daily with a meal.      . fish oil-omega-3 fatty acids 1000 MG capsule Take 1 g by mouth 3 (three) times daily.      . Multiple Vitamin (MULTIVITAMIN WITH MINERALS) TABS Take 1 tablet by mouth daily.      Marland Kitchen omeprazole (PRILOSEC) 20 MG capsule Take 20 mg by mouth daily.      . pantoprazole (PROTONIX) 20 MG tablet Take 1 tablet (20 mg total) by mouth daily.  30 tablet  0  . polyethylene glycol (MIRALAX / GLYCOLAX) packet Take 17 g by mouth as directed. 3 days on and 3 days off        Allergies: Allergies as of 07/27/2013 - Review Complete 07/27/2013  Allergen Reaction Noted  . Codeine    . Sulfur  07/27/2013   Past Medical History  Diagnosis Date  . Arthritis   . Uncontrolled hypertension, stage 1 08/26/2012   Past Surgical History  Procedure Laterality Date  . Tubal ligation    . Cholecystectomy  october 2012  . Ercp  08/22/2012    Procedure: ENDOSCOPIC RETROGRADE CHOLANGIOPANCREATOGRAPHY (ERCP);  Surgeon: Louis Meckel, MD;  Location: [facility] OR;  Service: Endoscopy;  Laterality: N/A;  . Eus  11/21/2012    Procedure: UPPER ENDOSCOPIC ULTRASOUND (EUS) LINEAR;  Surgeon: Rachael Fee, MD;  Location: [facility] ENDOSCOPY;  Service: Endoscopy;  Laterality: N/A;   Family History  Problem Relation Age of Onset  . Hypertension Mother   . Hypertension Sister   . Colon cancer Mother    History   Social History  . Marital Status: Widowed    Spouse Name: N/A    Number of Children: N/A  .  Years of Education: N/A   Occupational History  . Not on file.   Social History Main Topics  . Smoking status: Never Smoker   . Smokeless tobacco: Never Used  . Alcohol Use: No  . Drug Use: No  . Sexual Activity: No   Other Topics Concern  . Not on file   Social History Narrative  . No narrative on file    Review of Systems: Pertinent items are noted in HPI.  Physical Exam: There were no vitals taken for this visit. Physical Exam  Constitutional: She is oriented to person, place, and time. She appears well-developed and well-nourished.  HENT:  Head: Normocephalic.  Mouth/Throat: Oropharynx is clear and moist. No oropharyngeal exudate.  Eyes: EOM are normal. Pupils are equal, round, and reactive to light.  Miotic pupils  Neck: Normal range of motion. Neck supple.  Cardiovascular: Normal rate, regular rhythm, normal heart sounds and intact distal pulses.  Exam reveals no friction rub.   No murmur heard. Pulmonary/Chest: Effort normal and breath sounds normal. No respiratory distress. She has no wheezes. She has no rales.  Abdominal: Soft. Bowel sounds are normal. She exhibits no distension. There is tenderness.  Positive murphy's sign  Musculoskeletal: She exhibits no edema and no tenderness.  Neurological: She is alert and oriented to person, place, and time.  Psychiatric: She has a normal mood and affect. Her behavior is normal.   Lab results: Basic Metabolic Panel:  Recent Labs  16/10/96 2352  NA 136  K 3.0*  CL 99  CO2 26  GLUCOSE 95  BUN 5*  CREATININE 0.55  CALCIUM 9.5   Liver Function Tests:  Recent Labs  07/27/13 2352  AST 106*  ALT 75*  ALKPHOS 195*  BILITOT 0.3  PROT 7.5  ALBUMIN 3.6  Baseline: AST 39, ALT 27, Alk Phosph 95 (07/09/13)  Recent Labs  07/27/13 2352  LIPASE 23   CBC:  Recent Labs  07/27/13 2352  WBC 8.6  NEUTROABS 6.7  HGB 12.9  HCT 37.5  MCV 81.7  PLT 313    Assessment & Plan by Problem: Principal Problem:    Abdominal pain, epigastric Active Problems:   Benign neoplasm of duodenum   Hypokalemia   Elevated LFTs  # Abdominal Pain with elevated transaminases The patients abdominal pain is likely due to obstruction of the common bile duct. The etiology of the obstruction is uncertain. Per recent CT, a cholesterol stone is a possibility. Of note, f/u ERCP in June 2014 showed a ampulla with normal with no adenomatous tissue at the prior polypectomy site. The obstruction could be due to malignancy versus stricture. Alcohol abuse  is a possibility, but patients denies alcohol use. - Consult GI for MRCP - NPO - IVFs - PPI PO BID  # Hypokalemia - BMP in AM, replete lytes as indicated  DTV Proph: Lovenox  Nutrition: NPO  Dispo: Disposition is deferred at this time, awaiting improvement of current medical problems. Anticipated discharge in approximately 1-3 day(s).   The patient does have a current PCP (No Pcp Per Patient) and does need an [facility] hospital follow-up appointment after discharge.  The patient does not know have transportation limitations that hinder transportation to clinic appointments.  Signed: Pleas Koch, MD 07/28/2013, 1:01 AM

## 2013-07-28 NOTE — Progress Notes (Signed)
INITIAL NUTRITION ASSESSMENT  DOCUMENTATION CODES Per approved criteria  -Not Applicable   INTERVENTION: Advance diet as medically appropriate  RD to follow for nutrition care plan, add interventions accordingly  NUTRITION DIAGNOSIS: Inadequate oral intake related to inability to eat as evidenced by NPO status  Goal: Pt to meet >/= 90% of their estimated nutrition needs   Monitor:  PO advancement & intake, weight, labs, I/O's  Reason for Assessment: Malnutrition Screening Tool Report  72 y.o. female  Admitting Dx: Abdominal pain, epigastric  ASSESSMENT: Patient with PMH of cholecystecomy in October 2012 presented with abdominal pain; CT abdomen demonstrated small low density lesion within the distal common bile duct possibly a cholesterol stone within the common bile duct ---> admitted for MRCP.  Patient reports her appetite has been poor; has been vomiting some after meals for ~ 3 weeks; she states she would mostly consume liquids; weight has been stable; NPO at present, however, would benefit from addition of nutrition supplements when/as able ---> RD to monitor.  Height: Ht Readings from Last 1 Encounters:  07/28/13 5\' 2"  (1.575 m)    Weight: Wt Readings from Last 1 Encounters:  07/28/13 145 lb 11.6 oz (66.1 kg)    Ideal Body Weight: 110 lb  % Ideal Body Weight: 75%  Wt Readings from Last 10 Encounters:  07/28/13 145 lb 11.6 oz (66.1 kg)  07/25/13 150 lb (68.04 kg)  07/23/13 146 lb (66.225 kg)  11/21/12 151 lb (68.493 kg)  11/21/12 151 lb (68.493 kg)  09/26/12 150 lb (68.04 kg)  08/26/12 156 lb 6.4 oz (70.943 kg)  08/21/12 153 lb 1.6 oz (69.446 kg)  08/21/12 153 lb 1.6 oz (69.446 kg)  09/22/11 147 lb (66.679 kg)    Usual Body Weight: 151 lb  % Usual Body Weight: 96%  BMI:  Body mass index is 26.65 kg/(m^2).  Estimated Nutritional Needs: Kcal: 1700-1900 Protein: 80-90 gm Fluid: 1.7-1.9 L  Skin: Intact  Diet Order: NPO  EDUCATION NEEDS: -No  education needs identified at this time   Intake/Output Summary (Last 24 hours) at 07/28/13 1421 Last data filed at 07/28/13 0800  Gross per 24 hour  Intake 1006.25 ml  Output    200 ml  Net 806.25 ml    Labs:   Recent Labs Lab 07/23/13 2020 07/27/13 2352 07/28/13 0555  NA 136 136 136  K 3.2* 3.0* 4.0  CL 99 99 102  CO2 25 26 23   BUN 9 5* 5*  CREATININE 0.58 0.55 0.47*  CALCIUM 9.7 9.5 9.1  GLUCOSE 105* 95 104*    Scheduled Meds: . enoxaparin (LOVENOX) injection  40 mg Subcutaneous Q24H  . [START ON 07/29/2013] influenza vac split quadrivalent PF  0.5 mL Intramuscular Tomorrow-1000  . pantoprazole  40 mg Oral Daily    Continuous Infusions: . dextrose 5 % and 0.9% NaCl 1,000 mL (07/28/13 1323)    Past Medical History  Diagnosis Date  . Arthritis   . Uncontrolled hypertension, stage 1 08/26/2012    Past Surgical History  Procedure Laterality Date  . Tubal ligation    . Cholecystectomy  october 2012  . Ercp  08/22/2012    Procedure: ENDOSCOPIC RETROGRADE CHOLANGIOPANCREATOGRAPHY (ERCP);  Surgeon: Louis Meckel, MD;  Location: [facility] OR;  Service: Endoscopy;  Laterality: N/A;  . Eus  11/21/2012    Procedure: UPPER ENDOSCOPIC ULTRASOUND (EUS) LINEAR;  Surgeon: Rachael Fee, MD;  Location: [facility] ENDOSCOPY;  Service: Endoscopy;  Laterality: N/A;  . Esophagogastroduodenoscopy  04/25/13  Surveillance at [facility] - Normal appearing ampulla; No residual adenomatous tissue seen  . Ventral hernia repair  08/2011    s/p chole, at trochar site, by Dr. Virl Son, RD, LDN Pager #: [phone] After-Hours Pager #: [phone]

## 2013-07-28 NOTE — Consult Note (Signed)
[facility] Consultation  Referring Provider: Internal Medicine Teaching Service      Primary Care Physician:  No PCP Per Patient Primary Gastroenterologist:  Rob Bunting, MD Reason for Consultation:              HPI:   Teresa Keller is a 72 y.o. female known to Dr. Christella Hartigan in our office. She underwent lap cholecystectomy October 2012 then ERCP with biliary sphincterotomy and small stone extraction October 2013 (Dr. Arlyce Dice). At the time of ERCP a periampullary duodenal polyp was found and biopsies proved it to be adenomatous. Patient was referred to Dr. Othelia Pulling at [facility] who removed the adenoma January 2014. I don't have results but repeat EGD in June was normal per patient.   I saw the patient in the office three days ago for evaluation of nausea, vomiting and recurrent upper abdominal pain reminiscent of biliary pain. Patient had been evaluated in ED 8/24 at which time U/S and LFTs were normal. She was advised to stop NSAIDS and take BID PPI. Patient had gone back to ED a couple of days prior to our visit. At that time her Alk phos was elevated at 208, bili normal, transaminases mildly elevated, lipase normal. CTscan revealed small low density lesion within the distal common duct. Patient was scheduled for MRCP to be done on the 15th but she was admitted through ED last night for ongoing abdominal pain. Admitting LFTs not much different than those of 07/23/13 except that transaminases were slightly higher. She is afebrile with normal white count    Pain is epigastric, non-radiating, much worse with meals. She has associated nausea and vomiting. Symptoms start within 10 minutes of eating, lasts about 45 minutes.   Past Medical History  Diagnosis Date  . Arthritis   . Uncontrolled hypertension, stage 1 08/26/2012    Past Surgical History  Procedure Laterality Date  . Tubal ligation    . Cholecystectomy  october 2012  . Ercp  08/22/2012    Procedure: ENDOSCOPIC RETROGRADE  CHOLANGIOPANCREATOGRAPHY (ERCP);  Surgeon: Louis Meckel, MD;  Location: [facility] OR;  Service: Endoscopy;  Laterality: N/A;  . Eus  11/21/2012    Procedure: UPPER ENDOSCOPIC ULTRASOUND (EUS) LINEAR;  Surgeon: Rachael Fee, MD;  Location: [facility] ENDOSCOPY;  Service: Endoscopy;  Laterality: N/A;  . Esophagogastroduodenoscopy  04/25/13    Surveillance at [facility] - Normal appearing ampulla; No residual adenomatous tissue seen  . Ventral hernia repair  08/2011    s/p chole, at trochar site, by Dr. Ezzard Standing    Family History  Problem Relation Age of Onset  . Hypertension Mother   . Hypertension Sister   . Colon cancer Mother      History  Substance Use Topics  . Smoking status: Never Smoker   . Smokeless tobacco: Never Used  . Alcohol Use: No    Prior to Admission medications   Medication Sig Start Date End Date Taking? Authorizing Provider  Calcium Carbonate-Vitamin D (CALCIUM-VITAMIN D) 500-200 MG-UNIT per tablet Take 1 tablet by mouth 2 (two) times daily with a meal.   Yes Historical Provider, MD  fish oil-omega-3 fatty acids 1000 MG capsule Take 1 g by mouth 3 (three) times daily.   Yes Historical Provider, MD  Multiple Vitamin (MULTIVITAMIN WITH MINERALS) TABS Take 1 tablet by mouth daily.   Yes Historical Provider, MD  omeprazole (PRILOSEC) 20 MG capsule Take 20 mg by mouth daily.   Yes Historical Provider, MD  pantoprazole (PROTONIX) 20 MG tablet  Take 1 tablet (20 mg total) by mouth daily. 07/09/13  Yes Loren Racer, MD  polyethylene glycol [facility] / GLYCOLAX) packet Take 17 g by mouth as directed. 3 days on and 3 days off   Yes Historical Provider, MD    Current Facility-Administered Medications  Medication Dose Route Frequency Provider Last Rate Last Dose  . dextrose 5 %-0.9 % sodium chloride infusion   Intravenous Continuous Ejiroghene Emokpae, MD 100 mL/hr at 07/28/13 1323 1,000 mL at 07/28/13 1323  . enoxaparin (LOVENOX) injection 40 mg  40 mg Subcutaneous Q24H Belia Heman,  MD      . Melene Muller ON 07/29/2013] influenza vac split quadrivalent PF (FLUARIX) injection 0.5 mL  0.5 mL Intramuscular Tomorrow-1000 Jonah Blue, DO      . pantoprazole (PROTONIX) EC tablet 40 mg  40 mg Oral Daily Belia Heman, MD   40 mg at 07/28/13 0951    Allergies as of 07/27/2013 - Review Complete 07/27/2013  Allergen Reaction Noted  . Codeine    . Sulfur  07/27/2013    Review of Systems:    All systems reviewed and negative except where noted in HPI.    Physical Exam:  Vital signs in last 24 hours: Temp:  [97.9 F (36.6 C)-98.6 F (37 C)] 97.9 F (36.6 C) (09/12 1610) Pulse Rate:  [61-79] 61 (09/12 0613) Resp:  [18-20] 20 (09/12 9604) BP: (114-142)/(65-74) 114/73 mmHg (09/12 0613) SpO2:  [96 %-98 %] 97 % (09/12 0613) Weight:  [145 lb 11.6 oz (66.1 kg)] 145 lb 11.6 oz (66.1 kg) (09/12 0421) Last BM Date: 07/27/13 General:   Pleasant black female in NAD Head:  Normocephalic and atraumatic. Eyes:   No icterus.   Conjunctiva pink. Ears:  Normal auditory acuity. Neck:  Supple; no masses felt Lungs:  Respirations even and unlabored. Lungs clear to auscultation bilaterally.   No wheezes, crackles, or rhonchi.  Heart:  Regular rate and rhythm;  murmur heard. Abdomen:  Soft, nondistended, mild epigastric tenderness.  Normal bowel sounds. No appreciable masses or hepatomegaly.  Rectal:  Not performed.  Msk:  Symmetrical without gross deformities.  Extremities:  Without edema. Neurologic:  Alert and  oriented x4;  grossly normal neurologically. Skin:  Intact without significant lesions or rashes. Cervical Nodes:  No significant cervical adenopathy. Psych:  Alert and cooperative. Normal affect.  LAB RESULTS:  Recent Labs  07/27/13 2352  WBC 8.6  HGB 12.9  HCT 37.5  PLT 313   BMET  Recent Labs  07/27/13 2352 07/28/13 0555  NA 136 136  K 3.0* 4.0  CL 99 102  CO2 26 23  GLUCOSE 95 104*  BUN 5* 5*  CREATININE 0.55 0.47*  CALCIUM 9.5 9.1   LFT  Recent  Labs  07/28/13 0555  PROT 7.0  ALBUMIN 3.3*  AST 60*  ALT 61*  ALKPHOS 162*  BILITOT 0.3      Impression / Plan:   72 year old female with nausea, vomiting, and epigastric pain reminiscent of bilary pain. Symptoms in the face of abnormal LFTs and recent CTscan revealing distal CBD lesion. Rule out recurrent CBD stone though she had a sphincterotomy in Oct 2013. For further evaluation MRCP has been ordered.   Thanks   LOS: 1 day   Willette Cluster  07/28/2013, 2:06 PM  GI ATTENDING  History, laboratories, x-rays, prior endoscopy reports reviewed. Patient personally seen and examined. Agree with H&P as outlined above. Patient has had problems with recurrent pain reminiscent of her gallbladder  problems. Abnormal LFTs noted. Suspect that she may have recurrent choledocholithiasis or possibly scarring from prior ampullectomy. She looks well and has a benign abdomen. Agree with plans for MRCP. Will follow.  Wilhemina Bonito. Eda Keys., M.D. [facility] Division of Gastroenterology

## 2013-07-29 ENCOUNTER — Inpatient Hospital Stay (HOSPITAL_COMMUNITY): Payer: BC Managed Care – PPO

## 2013-07-29 DIAGNOSIS — K8050 Calculus of bile duct without cholangitis or cholecystitis without obstruction: Secondary | ICD-10-CM

## 2013-07-29 MED ORDER — GADOBENATE DIMEGLUMINE 529 MG/ML IV SOLN
15.0000 mL | Freq: Once | INTRAVENOUS | Status: AC | PRN
  Administered 2013-07-29: 09:00:00 15 mL via INTRAVENOUS

## 2013-07-29 NOTE — Progress Notes (Signed)
Subjective: No acute events. Abdominal pain greatly improved. No nausea or vomiting. Tolerating some clears.  Objective: Vital signs in last 24 hours: Filed Vitals:   07/28/13 0421 07/28/13 0613 07/28/13 2140 07/29/13 0536  BP: 142/74 114/73 127/75 129/76  Pulse: 72 61 73 56  Temp: 98.6 F (37 C) 97.9 F (36.6 C) 98.2 F (36.8 C) 98.3 F (36.8 C)  TempSrc: Oral Tympanic Oral Oral  Resp: 20 20 18 20   Height: 5\' 2"  (1.575 m)     Weight: 145 lb 11.6 oz (66.1 kg)     SpO2: 98% 97% 98% 97%   Weight change:   Intake/Output Summary (Last 24 hours) at 07/29/13 1350 Last data filed at 07/29/13 1100  Gross per 24 hour  Intake   1800 ml  Output      0 ml  Net   1800 ml   Physical exam Vitals reviewed. General: Sitting up in a chair, NAD HEENT: PERRL, EOMI, no scleral icterus Cardiac: RRR, no rubs, murmurs or gallops Pulm: clear to auscultation bilaterally, no wheezes, rales, or rhonchi Abd: soft, Mild midepigastric TTP, nondistended, BS present Ext: warm and well perfused, no pedal edema Neuro: alert and oriented X3, cranial nerves II-XII grossly intact, strength and sensation to light touch equal in bilateral upper and lower extremities   Lab Results: Basic Metabolic Panel:  Recent Labs Lab 07/27/13 2352 07/28/13 0555  NA 136 136  K 3.0* 4.0  CL 99 102  CO2 26 23  GLUCOSE 95 104*  BUN 5* 5*  CREATININE 0.55 0.47*  CALCIUM 9.5 9.1   Liver Function Tests:  Recent Labs Lab 07/27/13 2352 07/28/13 0555  AST 106* 60*  ALT 75* 61*  ALKPHOS 195* 162*  BILITOT 0.3 0.3  PROT 7.5 7.0  ALBUMIN 3.6 3.3*    Recent Labs Lab 07/23/13 2020 07/27/13 2352  LIPASE 26 23   CBC:  Recent Labs Lab 07/23/13 2020 07/27/13 2352  WBC 8.0 8.6  NEUTROABS 5.0 6.7  HGB 12.8 12.9  HCT 38.4 37.5  MCV 81.5 81.7  PLT 313 313    Urinalysis:  Recent Labs Lab 07/23/13 2035  COLORURINE YELLOW  LABSPEC 1.023  PHURINE 7.0  GLUCOSEU NEGATIVE  HGBUR NEGATIVE    BILIRUBINUR NEGATIVE  KETONESUR NEGATIVE  PROTEINUR 30*  UROBILINOGEN 1.0  NITRITE NEGATIVE  LEUKOCYTESUR SMALL*   Micro Results: No results found for this or any previous visit (from the past 240 hour(s)).  Studies/Results: Mr 3d Recon At Scanner  07/29/2013   CLINICAL DATA:  Postprandial abdominal pain. Status post cholecystectomy with history of choledocholithiasis.  EXAM: MRI ABDOMEN WITHOUT AND WITH CONTRAST (INCLUDING MRCP)  TECHNIQUE: Multiplanar multisequence MR imaging of the abdomen was performed both before and after the administration of intravenous contrast. Heavily T2-weighted images of the biliary and pancreatic ducts were obtained, and three-dimensional MRCP images were rendered by post processing.  CONTRAST:  15mL MULTIHANCE GADOBENATE DIMEGLUMINE 529 MG/ML IV SOLN  COMPARISON:  CT abdomen pelvis dated 07/23/2013  FINDINGS: Mild hepatic steatosis. No suspicious/ enhancing hepatic lesion.  Spleen, pancreas, and adrenal glands are within normal limits.  Status post cholecystectomy. No intrahepatic or extrahepatic ductal dilatation. Common duct measures 8 mm. 3 mm gallstone in the distal common duct just above the ampulla (series 5/ image 28).  Kidneys are within normal limits. No hydronephrosis.  No abdominal ascites.  No suspicious abdominal lymphadenopathy.  No focal osseous lesions.  IMPRESSION: Status post cholecystectomy. No intrahepatic or extrahepatic ductal dilatation. Common duct  measures 8 mm.  3 mm gallstone in the distal common duct just above the ampulla. Consider ERCP.  Mild hepatic steatosis.   Electronically Signed   By: Charline Bills M.D.   On: 07/29/2013 12:06   Mr Abd W/wo Cm/mrcp  07/29/2013   CLINICAL DATA:  Postprandial abdominal pain. Status post cholecystectomy with history of choledocholithiasis.  EXAM: MRI ABDOMEN WITHOUT AND WITH CONTRAST (INCLUDING MRCP)  TECHNIQUE: Multiplanar multisequence MR imaging of the abdomen was performed both before and after  the administration of intravenous contrast. Heavily T2-weighted images of the biliary and pancreatic ducts were obtained, and three-dimensional MRCP images were rendered by post processing.  CONTRAST:  15mL MULTIHANCE GADOBENATE DIMEGLUMINE 529 MG/ML IV SOLN  COMPARISON:  CT abdomen pelvis dated 07/23/2013  FINDINGS: Mild hepatic steatosis. No suspicious/ enhancing hepatic lesion.  Spleen, pancreas, and adrenal glands are within normal limits.  Status post cholecystectomy. No intrahepatic or extrahepatic ductal dilatation. Common duct measures 8 mm. 3 mm gallstone in the distal common duct just above the ampulla (series 5/ image 28).  Kidneys are within normal limits. No hydronephrosis.  No abdominal ascites.  No suspicious abdominal lymphadenopathy.  No focal osseous lesions.  IMPRESSION: Status post cholecystectomy. No intrahepatic or extrahepatic ductal dilatation. Common duct measures 8 mm.  3 mm gallstone in the distal common duct just above the ampulla. Consider ERCP.  Mild hepatic steatosis.   Electronically Signed   By: Charline Bills M.D.   On: 07/29/2013 12:06   Medications: I have reviewed the patient's current medications. Scheduled Meds: . enoxaparin (LOVENOX) injection  40 mg Subcutaneous Q24H  . pantoprazole  40 mg Oral Daily   Continuous Infusions: . dextrose 5 % and 0.9% NaCl 100 mL/hr at 07/29/13 0006   PRN Meds:.  Assessment/Plan:  # Abdominal Pain with elevated transaminases:   Likely due to obstruction of the common bile duct. The etiology of the obstruction is uncertain. Per recent CT, a cholesterol stone is a possibility. ERCP in June 2014 showed a ampulla with normal with no adenomatous tissue at the prior polypectomy site. GI was consulted and performed an MRCP today which showed one distal CBD stone and possible stenosis which may be from her prior sphincterotomy. Plans for ERCP on Monday. LFTS improving. - F/u with GI, appreciate recommendations - Clears - PPI PO BID    # Hypokalemia:  On admission- K of 3.0, given KCl,  K corrected to 4.0.  Recent Labs Lab 07/23/13 2020 07/27/13 2352 07/28/13 0555  K 3.2* 3.0* 4.0    # DVT PPx: Lovenox   Dispo: Disposition is deferred at this time, awaiting improvement of current medical problems.  Anticipated discharge in approximately 1-2 day(s).   The patient does have a current PCP (No Pcp Per Patient) and might possibly need an [facility] hospital follow-up appointment after discharge.  The patient does not know have transportation limitations that hinder transportation to clinic appointments.  .Services Needed at time of discharge: Y = Yes, Blank = No PT:   OT:   RN:   Equipment:   Other:     LOS: 2 days   Genelle Gather, MD 07/29/2013, 1:50 PM

## 2013-07-29 NOTE — Progress Notes (Signed)
Patient ID: Teresa Keller, female   DOB: [birth year], 72 y.o.   MRN: [id number] [facility] Progress Note  Subjective: Feels OK- still uncomfortable, no severe pain. Does not want anything besides liquids- says she is dreaming about fried chicken....  MRCP- 3 mm stone in distal CBD,   CBD 8mm, s/p cholecytectomy  Objective:  Vital signs in last 24 hours: Temp:  [98.2 F (36.8 C)-98.3 F (36.8 C)] 98.3 F (36.8 C) (09/13 0536) Pulse Rate:  [56-73] 56 (09/13 0536) Resp:  [18-20] 20 (09/13 0536) BP: (127-129)/(75-76) 129/76 mmHg (09/13 0536) SpO2:  [97 %-98 %] 97 % (09/13 0536) Last BM Date: 07/27/13 General:   Alert,  Well-developed, AA  Female    in NAD Heart:  Regular rate and rhythm; no murmurs Pulm;clear Abdomen:  Soft, mild tenderness and nondistended. Normal bowel sounds, without guarding, and without rebound.   Extremities:  Without edema. Neurologic:  Alert and  oriented x4;  grossly normal neurologically. Psych:  Alert and cooperative. Normal mood and affect.  Intake/Output from previous day: 09/12 0701 - 09/13 0700 In: 1525 [P.O.:200; I.V.:1325] Out: -  Intake/Output this shift:    Lab Results:  Recent Labs  07/27/13 2352  WBC 8.6  HGB 12.9  HCT 37.5  PLT 313   BMET  Recent Labs  07/27/13 2352 07/28/13 0555  NA 136 136  K 3.0* 4.0  CL 99 102  CO2 26 23  GLUCOSE 95 104*  BUN 5* 5*  CREATININE 0.55 0.47*  CALCIUM 9.5 9.1   LFT  Recent Labs  07/28/13 0555  PROT 7.0  ALBUMIN 3.3*  AST 60*  ALT 61*  ALKPHOS 162*  BILITOT 0.3      Assessment / Plan: #1 72 yo female with recurrent epigastric pain,elevated LFT's -in setting of prior cholecystectomy, as well as  Choledocholithiasis requiring ERCP and stone extraction , and prior ampullectomy  For adenoma MRCP shows one distal CBD stone- ? Some stenosis or prior sphincterotomy  She will need ERCP ,stone extraction- will plan for Monday with Dr. Marina Goodell (will need to hold  LOvenox) Leave on liquids for now Principal Problem:   Abdominal pain, epigastric Active Problems:   Benign neoplasm of duodenum   Hypokalemia   Elevated LFTs     LOS: 2 days   Amy Esterwood  07/29/2013, 12:26 PM   GI ATTENDING  Interval history, laboratories, and x-rays reviewed. Patient personally seen and examined. Agree with H&P as outlined above. Recent problems with pain and elevated LFTs. Be due to choledocholithiasis, despite prior sphincterotomy. History of ampullectomy as noted. Agree with plans for ERCP on Monday. Will need to hold Lovenox and provide preprocedure antibiotics.  Wilhemina Bonito. Eda Keys., M.D. [facility] Division of Gastroenterology

## 2013-07-30 MED ORDER — CIPROFLOXACIN IN D5W 400 MG/200ML IV SOLN
400.0000 mg | Freq: Once | INTRAVENOUS | Status: AC
  Administered 2013-07-31: 400 mg via INTRAVENOUS
  Filled 2013-07-30: qty 200

## 2013-07-30 NOTE — Progress Notes (Signed)
Subjective: No acute events. Abdominal pain greatly improved. No nausea or vomiting. Tolerating clears. Plans for ERCP with GI tomorrow.  Objective: Vital signs in last 24 hours: Filed Vitals:   07/29/13 0536 07/29/13 1426 07/29/13 2149 07/30/13 0602  BP: 129/76 135/74 148/83 125/70  Pulse: 56 82 67 65  Temp: 98.3 F (36.8 C) 97.8 F (36.6 C) 97.2 F (36.2 C) 98.6 F (37 C)  TempSrc: Oral Oral Oral Oral  Resp: 20 20 17 17   Height:      Weight:      SpO2: 97% 95% 100%    Weight change:   Intake/Output Summary (Last 24 hours) at 07/30/13 1138 Last data filed at 07/30/13 0500  Gross per 24 hour  Intake   2500 ml  Output      3 ml  Net   2497 ml   Physical exam Vitals reviewed. General: Sitting up in a chair, NAD HEENT: PERRL, EOMI, no scleral icterus Cardiac: RRR, no rubs, murmurs or gallops Pulm: clear to auscultation bilaterally, no wheezes, rales, or rhonchi Abd: soft, Mild midepigastric TTP, nondistended, BS present Ext: warm and well perfused, no pedal edema Neuro: alert and oriented X3, cranial nerves II-XII grossly intact, strength and sensation to light touch equal in bilateral upper and lower extremities   Lab Results: Basic Metabolic Panel:  Recent Labs Lab 07/27/13 2352 07/28/13 0555  NA 136 136  K 3.0* 4.0  CL 99 102  CO2 26 23  GLUCOSE 95 104*  BUN 5* 5*  CREATININE 0.55 0.47*  CALCIUM 9.5 9.1   Liver Function Tests:  Recent Labs Lab 07/27/13 2352 07/28/13 0555  AST 106* 60*  ALT 75* 61*  ALKPHOS 195* 162*  BILITOT 0.3 0.3  PROT 7.5 7.0  ALBUMIN 3.6 3.3*    Recent Labs Lab 07/23/13 2020 07/27/13 2352  LIPASE 26 23   CBC:  Recent Labs Lab 07/23/13 2020 07/27/13 2352  WBC 8.0 8.6  NEUTROABS 5.0 6.7  HGB 12.8 12.9  HCT 38.4 37.5  MCV 81.5 81.7  PLT 313 313    Urinalysis:  Recent Labs Lab 07/23/13 2035  COLORURINE YELLOW  LABSPEC 1.023  PHURINE 7.0  GLUCOSEU NEGATIVE  HGBUR NEGATIVE  BILIRUBINUR NEGATIVE    KETONESUR NEGATIVE  PROTEINUR 30*  UROBILINOGEN 1.0  NITRITE NEGATIVE  LEUKOCYTESUR SMALL*   Micro Results: No results found for this or any previous visit (from the past 240 hour(s)).  Studies/Results: Mr 3d Recon At Scanner  07/29/2013   CLINICAL DATA:  Postprandial abdominal pain. Status post cholecystectomy with history of choledocholithiasis.  EXAM: MRI ABDOMEN WITHOUT AND WITH CONTRAST (INCLUDING MRCP)  TECHNIQUE: Multiplanar multisequence MR imaging of the abdomen was performed both before and after the administration of intravenous contrast. Heavily T2-weighted images of the biliary and pancreatic ducts were obtained, and three-dimensional MRCP images were rendered by post processing.  CONTRAST:  15mL MULTIHANCE GADOBENATE DIMEGLUMINE 529 MG/ML IV SOLN  COMPARISON:  CT abdomen pelvis dated 07/23/2013  FINDINGS: Mild hepatic steatosis. No suspicious/ enhancing hepatic lesion.  Spleen, pancreas, and adrenal glands are within normal limits.  Status post cholecystectomy. No intrahepatic or extrahepatic ductal dilatation. Common duct measures 8 mm. 3 mm gallstone in the distal common duct just above the ampulla (series 5/ image 28).  Kidneys are within normal limits. No hydronephrosis.  No abdominal ascites.  No suspicious abdominal lymphadenopathy.  No focal osseous lesions.  IMPRESSION: Status post cholecystectomy. No intrahepatic or extrahepatic ductal dilatation. Common duct measures 8 mm.  3 mm gallstone in the distal common duct just above the ampulla. Consider ERCP.  Mild hepatic steatosis.   Electronically Signed   By: Charline Bills M.D.   On: 07/29/2013 12:06   Mr Abd W/wo Cm/mrcp  07/29/2013   CLINICAL DATA:  Postprandial abdominal pain. Status post cholecystectomy with history of choledocholithiasis.  EXAM: MRI ABDOMEN WITHOUT AND WITH CONTRAST (INCLUDING MRCP)  TECHNIQUE: Multiplanar multisequence MR imaging of the abdomen was performed both before and after the administration of  intravenous contrast. Heavily T2-weighted images of the biliary and pancreatic ducts were obtained, and three-dimensional MRCP images were rendered by post processing.  CONTRAST:  15mL MULTIHANCE GADOBENATE DIMEGLUMINE 529 MG/ML IV SOLN  COMPARISON:  CT abdomen pelvis dated 07/23/2013  FINDINGS: Mild hepatic steatosis. No suspicious/ enhancing hepatic lesion.  Spleen, pancreas, and adrenal glands are within normal limits.  Status post cholecystectomy. No intrahepatic or extrahepatic ductal dilatation. Common duct measures 8 mm. 3 mm gallstone in the distal common duct just above the ampulla (series 5/ image 28).  Kidneys are within normal limits. No hydronephrosis.  No abdominal ascites.  No suspicious abdominal lymphadenopathy.  No focal osseous lesions.  IMPRESSION: Status post cholecystectomy. No intrahepatic or extrahepatic ductal dilatation. Common duct measures 8 mm.  3 mm gallstone in the distal common duct just above the ampulla. Consider ERCP.  Mild hepatic steatosis.   Electronically Signed   By: Charline Bills M.D.   On: 07/29/2013 12:06   Medications: I have reviewed the patient's current medications. Scheduled Meds: . pantoprazole  40 mg Oral Daily   Continuous Infusions: . dextrose 5 % and 0.9% NaCl 100 mL/hr at 07/30/13 1046   PRN Meds:.  Assessment/Plan:  # Abdominal Pain with elevated transaminases:   Likely due to obstruction of the common bile duct. Per recent CT, a cholesterol stone is a possibility. ERCP in June 2014 showed a ampulla with normal with no adenomatous tissue at the prior polypectomy site. GI was consulted and performed an MRCP on 9/13 which showed one distal CBD stone and possible stenosis which may be from her prior sphincterotomy. Plans for ERCP on Monday. LFTS improving. Lovenox has ben held and pt is NPO @ MN. - F/u with GI, appreciate recommendations - Clears, NPO @ [facility] - PPI PO BID   # Hypokalemia:  On admission- K of 3.0, given KCl,  K corrected  to 4.0.  Recent Labs Lab 07/23/13 2020 07/27/13 2352 07/28/13 0555  K 3.2* 3.0* 4.0    # DVT PPx: Lovenox   Dispo:  Possible d/c to home tomorrow after ERCP.    The patient does have a current PCP (No Pcp Per Patient) and might possibly need an [facility] hospital follow-up appointment after discharge.  The patient does not know have transportation limitations that hinder transportation to clinic appointments.  .Services Needed at time of discharge: Y = Yes, Blank = No PT:   OT:   RN:   Equipment:   Other:     LOS: 3 days   Genelle Gather, MD 07/30/2013, 11:38 AM

## 2013-07-30 NOTE — Progress Notes (Signed)
Patient ID: Teresa Keller, female   DOB: Sep 01, 1941, 72 y.o.   MRN: [id number] [facility] Progress Note  Subjective: Feels ok- continues with "discomfort"- no severe pain- does not want to eat solid food until after procedure.  Objective:  Vital signs in last 24 hours: Temp:  [97.2 F (36.2 C)-98.6 F (37 C)] 98.6 F (37 C) (09/14 0602) Pulse Rate:  [65-82] 65 (09/14 0602) Resp:  [17-20] 17 (09/14 0602) BP: (125-148)/(70-83) 125/70 mmHg (09/14 0602) SpO2:  [95 %-100 %] 100 % (09/13 2149) Last BM Date: 07/29/13 General:   Alert,  Well-developed, AA female   in NAD Heart:  Regular rate and rhythm; no murmurs Pulm;clear Abdomen:  Soft, mild tenderness and nondistended. Normal bowel sounds, without guarding, and without rebound.   Extremities:  Without edema. Neurologic:  Alert and  oriented x4;  grossly normal neurologically. Psych:  Alert and cooperative. Normal mood and affect.  Intake/Output from previous day: 09/13 0701 - 09/14 0700 In: 3380 [P.O.:1080; I.V.:2300] Out: 3 [Stool:3] Intake/Output this shift:    Lab Results:  Recent Labs  07/27/13 2352  WBC 8.6  HGB 12.9  HCT 37.5  PLT 313   BMET  Recent Labs  07/27/13 2352 07/28/13 0555  NA 136 136  K 3.0* 4.0  CL 99 102  CO2 26 23  GLUCOSE 95 104*  BUN 5* 5*  CREATININE 0.55 0.47*  CALCIUM 9.5 9.1   LFT  Recent Labs  07/28/13 0555  PROT 7.0  ALBUMIN 3.3*  AST 60*  ALT 61*  ALKPHOS 162*  BILITOT 0.3      Assessment / Plan: #1  72 yo female with recurrent choledocholithiasis- s/p ERCP /stone extraction previously, as well as removal of ampullary adenoma . #2 s/p cholecystectomy  Plan- pt is scheduled for ERCP and stone extraction with Dr. Marina Goodell in am Monday- procedure discussed in detail with pt D/C lovenox Cipro pre procedure Possible d/c home later tomorrow  Principal Problem:   Abdominal pain, epigastric Active Problems:   Benign neoplasm of duodenum   Nausea with  vomiting   Hypokalemia   Elevated LFTs     LOS: 3 days   Amy Esterwood  07/30/2013, 11:27 AM   GI ATTENDING  Interval history and laboratories reviewed. Patient seen and examined. Agree with H&P as above. Clinically stable. Still with uncomfortable abdomen. Plan ERCP with stone extraction tomorrow.The nature of the procedure, as well as the risks, benefits, and alternatives were carefully and thoroughly reviewed with the patient. Ample time for discussion and questions allowed. The patient understood, was satisfied, and agreed to proceed.  Wilhemina Bonito. Eda Keys., M.D. [facility] Division of Gastroenterology

## 2013-07-31 ENCOUNTER — Encounter (HOSPITAL_COMMUNITY)
Admission: EM | Disposition: A | Discharge status: Home / Self Care | Payer: Self-pay | Source: Home / Self Care | Attending: Internal Medicine

## 2013-07-31 ENCOUNTER — Ambulatory Visit (HOSPITAL_COMMUNITY): Admission: RE | Admit: 2013-07-31 | Payer: BC Managed Care – PPO | Source: Ambulatory Visit

## 2013-07-31 ENCOUNTER — Telehealth: Payer: Self-pay | Admitting: *Deleted

## 2013-07-31 ENCOUNTER — Inpatient Hospital Stay (HOSPITAL_COMMUNITY): Payer: BC Managed Care – PPO

## 2013-07-31 ENCOUNTER — Encounter (HOSPITAL_COMMUNITY): Payer: Self-pay

## 2013-07-31 HISTORY — PX: ERCP: SHX5425

## 2013-07-31 LAB — GLUCOSE, CAPILLARY: Glucose-Capillary: 98 mg/dL (ref 70–99)

## 2013-07-31 SURGERY — ERCP, WITH INTERVENTION IF INDICATED
Anesthesia: Moderate Sedation

## 2013-07-31 MED ORDER — MIDAZOLAM HCL 10 MG/2ML IJ SOLN
INTRAMUSCULAR | Status: DC | PRN
  Administered 2013-07-31: 1 mg via INTRAVENOUS
  Administered 2013-07-31 (×2): 2 mg via INTRAVENOUS
  Administered 2013-07-31: 1 mg via INTRAVENOUS
  Administered 2013-07-31: 2 mg via INTRAVENOUS

## 2013-07-31 MED ORDER — GLUCAGON HCL (RDNA) 1 MG IJ SOLR
INTRAMUSCULAR | Status: AC
  Filled 2013-07-31: qty 4

## 2013-07-31 MED ORDER — GLUCAGON HCL (RDNA) 1 MG IJ SOLR
INTRAMUSCULAR | Status: DC | PRN
  Administered 2013-07-31 (×2): .5 mL via INTRAVENOUS

## 2013-07-31 MED ORDER — MIDAZOLAM HCL 5 MG/ML IJ SOLN
INTRAMUSCULAR | Status: AC
  Filled 2013-07-31: qty 2

## 2013-07-31 MED ORDER — DIPHENHYDRAMINE HCL 50 MG/ML IJ SOLN
INTRAMUSCULAR | Status: AC
  Filled 2013-07-31: qty 1

## 2013-07-31 MED ORDER — BUTAMBEN-TETRACAINE-BENZOCAINE 2-2-14 % EX AERO
INHALATION_SPRAY | CUTANEOUS | Status: DC | PRN
  Administered 2013-07-31: 2 via TOPICAL

## 2013-07-31 MED ORDER — SODIUM CHLORIDE 0.9 % IV SOLN
INTRAVENOUS | Status: DC | PRN
  Administered 2013-07-31: 11:00:00

## 2013-07-31 MED ORDER — FENTANYL CITRATE 0.05 MG/ML IJ SOLN
INTRAMUSCULAR | Status: DC | PRN
  Administered 2013-07-31 (×4): 25 ug via INTRAVENOUS

## 2013-07-31 MED ORDER — FENTANYL CITRATE 0.05 MG/ML IJ SOLN
INTRAMUSCULAR | Status: AC
  Filled 2013-07-31: qty 2

## 2013-07-31 MED ORDER — IBUPROFEN 400 MG PO TABS
400.0000 mg | ORAL_TABLET | Freq: Once | ORAL | Status: AC
  Administered 2013-07-31: 18:00:00 400 mg via ORAL
  Filled 2013-07-31: qty 1

## 2013-07-31 NOTE — Telephone Encounter (Signed)
Patient is inpatient at [facility]. [facility] radiology will cancel the MRCP scheduled there.

## 2013-07-31 NOTE — Progress Notes (Signed)
Subjective: Feels better. Scheduled for ERCP this Am- 9am. No fever. No complaints of Abdominal pain. No nausea or vomiting. Tolerating clears.   Objective: Vital signs in last 24 hours: Filed Vitals:   07/30/13 0602 07/30/13 1417 07/30/13 2204 07/31/13 0845  BP: 125/70 135/83 142/81 143/73  Pulse: 65 82 61 72  Temp: 98.6 F (37 C) 98.5 F (36.9 C) 98.1 F (36.7 C) 98.4 F (36.9 C)  TempSrc: Oral Oral Oral Oral  Resp: 17 18 17 18   Height:      Weight:      SpO2:  99% 96% 100%   Weight change:   Intake/Output Summary (Last 24 hours) at 07/31/13 1025 Last data filed at 07/31/13 0820  Gross per 24 hour  Intake   3100 ml  Output      0 ml  Net   3100 ml   Physical exam Vitals reviewed. General: Sitting up in a chair, NAD HEENT: PERRL, EOMI, no scleral icterus Cardiac: RRR, no rubs, murmurs or gallops Pulm: clear to auscultation bilaterally, no wheezes, rales, or rhonchi Abd: soft, no epigastric tenderness, nondistended, BS present Ext: warm and well perfused, no pedal edema Neuro: alert and oriented X3, cranial nerves II-XII grossly intact, strength and sensation to light touch equal in bilateral upper and lower extremities.   Lab Results: Basic Metabolic Panel:  Recent Labs Lab 07/27/13 2352 07/28/13 0555  NA 136 136  K 3.0* 4.0  CL 99 102  CO2 26 23  GLUCOSE 95 104*  BUN 5* 5*  CREATININE 0.55 0.47*  CALCIUM 9.5 9.1   Liver Function Tests:  Recent Labs Lab 07/27/13 2352 07/28/13 0555  AST 106* 60*  ALT 75* 61*  ALKPHOS 195* 162*  BILITOT 0.3 0.3  PROT 7.5 7.0  ALBUMIN 3.6 3.3*    Recent Labs Lab 07/27/13 2352  LIPASE 23   CBC:  Recent Labs Lab 07/27/13 2352  WBC 8.6  NEUTROABS 6.7  HGB 12.9  HCT 37.5  MCV 81.7  PLT 313    Studies/Results: Mr 3d Recon At Scanner  07/29/2013   CLINICAL DATA:  Postprandial abdominal pain. Status post cholecystectomy with history of choledocholithiasis.   IMPRESSION: Status post cholecystectomy.  No intrahepatic or extrahepatic ductal dilatation. Common duct measures 8 mm.  3 mm gallstone in the distal common duct just above the ampulla. Consider ERCP.  Mild hepatic steatosis.   Electronically Signed   By: Charline Bills M.D.   On: 07/29/2013 12:06   Mr Abd W/wo Cm/mrcp  07/29/2013   CLINICAL DATA:  Postprandial abdominal pain. Status post cholecystectomy with history of choledocholithiasis.    IMPRESSION: Status post cholecystectomy. No intrahepatic or extrahepatic ductal dilatation. Common duct measures 8 mm.  3 mm gallstone in the distal common duct just above the ampulla. Consider ERCP.  Mild hepatic steatosis.   Electronically Signed   By: Charline Bills M.D.   On: 07/29/2013 12:06   Medications: I have reviewed the patient's current medications. Scheduled Meds: . [facility] HOLD] pantoprazole  40 mg Oral Daily   Continuous Infusions: . dextrose 5 % and 0.9% NaCl 100 mL/hr at 07/30/13 2210   PRN Meds:. Assessment/Plan:  # Abdominal Pain with elevated transaminases:  Likely due to obstruction of the common bile duct. Per recent CT, a cholesterol stone is a possibility. ERCP in June 2014 showed a ampulla with normal with no adenomatous tissue at the prior polypectomy site. GI was consulted and performed an MRCP on 9/13 which showed one  distal CBD stone and possible stenosis which may be from her prior sphincterotomy. Plans for ERCP today. LFTS improving. Lovenox has ben held and pt is NPO since midnight.. - F/u with GI, appreciate recommendations - Clears, NPO @ MN - PPI PO BID   # Hypokalemia:  On admission- K of 3.0, given KCl,  K corrected to 4.0.  Recent Labs Lab 07/27/13 2352 07/28/13 0555  K 3.0* 4.0    # DVT PPx: Lovenox  Dispo:  Possible d/c to home today after ERCP.    The patient does have a current PCP (No Pcp Per Patient) and might possibly need an [facility] hospital follow-up appointment after discharge.  The patient does not know have transportation  limitations that hinder transportation to clinic appointments.  .Services Needed at time of discharge: Y = Yes, Blank = No PT:   OT:   RN:   Equipment:   Other:     LOS: 4 days   Kennis Carina, MD 07/31/2013, 10:25 AM

## 2013-07-31 NOTE — Progress Notes (Signed)
  Date: 07/31/2013  Patient name: AREEN TRAUTNER  Medical record number: [id number]  Date of birth: 04-27-1941   This patient has been seen and the plan of care was discussed with the house staff. Please see their note for complete details. I concur with their findings with the following additions/corrections:  S/P ERCP.  She had evidence of choledocholithiasis with sphincterotomy extension and balloon extraction of stones.  Appreciate GI input.  Advance diet as per GI.  Likely home in the morning.  Jonah Blue, DO, FACP [facility] Residency [facility] 07/31/2013, 1:18 PM

## 2013-07-31 NOTE — Interval H&P Note (Signed)
History and Physical Interval Note:  07/31/2013 10:18 AM  Teresa Keller  has presented today for surgery, with the diagnosis of CBD stone  The various methods of treatment have been discussed with the patient and family. After consideration of risks, benefits and other options for treatment, the patient has consented to  Procedure(s): ENDOSCOPIC RETROGRADE CHOLANGIOPANCREATOGRAPHY (ERCP) (N/A) as a surgical intervention .  The patient's history has been reviewed, patient examined, no change in status, stable for surgery.  I have reviewed the patient's chart and labs.  Questions were answered to the patient's satisfaction.     Yancey Flemings

## 2013-07-31 NOTE — Op Note (Signed)
[facility] [address] [place], [address]   ERCP PROCEDURE REPORT  PATIENT: Teresa Keller, Teresa Keller.  MR# :[id number] BIRTHDATE: 26-Mar-1941  GENDER: Female ENDOSCOPIST: Roxy Cedar, MD REFERRED BY: [facility] PROCEDURE DATE:  07/31/2013 PROCEDURE:   ERCP with sphincterotomy/papillotomy and ERCP with removal of calculus/calculi ASA CLASS:   Class II INDICATIONS:abdominal pain of suspected biliary origin.   abnormal MRCP.   abnormal liver function test . MEDICATIONS: Fentanyl 100 mcg IV and Versed 8 mg IV   , glucagon 1 mg IV TOPICAL ANESTHETIC: Cetacaine Spray  DESCRIPTION OF PROCEDURE:   After the risks benefits and alternatives of the procedure were thoroughly explained, informed consent was obtained.  The ercp pentax [id number]  endoscope was introduced through the mouth  and advanced to the second portion of the duodenum .  ENDOSCOPIC EXAM: The distal esophagus appeared to have a large caliber ring.  The stomach and duodenum were normal.  The region of the ampulla had evidence of prior sphincterotomy.  Below this, small amount of tissue (question residual adenoma). X-ray FINDINGS: A stat radiograph of the abdomen with the endoscope in position revealed prior cholecystectomy clips.  The bile duct was selectively and deeply cannulated.  Injection of contrast yielded multiple (3-4) filling defects consistent with stones. THERAPY: The prior sphincterotomy was quite small, This was extended with cutting via the ERBE system over a guidewire.  Cutting catheter was exchanged for a balloon.  Several stones were extracted.  Post extraction occlusion cholangiogram demonstrated no residual filling defects and excellent drainage.  The scope was then completely withdrawn from the patient and the procedure terminated.     COMPLICATIONS: .  There were no complications.  ENDOSCOPIC IMPRESSION: 1. Choledocholithiasis status post ERC with  sphincterotomy extension and balloon extraction of stones.  RECOMMENDATIONS: 1. Standard post ERCP observation. Home in a.m. if doing well   _______________________________ eSigned:  Roxy Cedar, MD 07/31/2013 11:10 AM   AO:[id number] Christella Hartigan, MD The Patient

## 2013-07-31 NOTE — H&P (View-Only) (Signed)
Patient ID: Teresa Keller, female   DOB: 08/19/1941, 71 y.o.   MRN: [id number] [facility] Progress Note  Subjective: Feels ok- continues with "discomfort"- no severe pain- does not want to eat solid food until after procedure.  Objective:  Vital signs in last 24 hours: Temp:  [97.2 F (36.2 C)-98.6 F (37 C)] 98.6 F (37 C) (09/14 0602) Pulse Rate:  [65-82] 65 (09/14 0602) Resp:  [17-20] 17 (09/14 0602) BP: (125-148)/(70-83) 125/70 mmHg (09/14 0602) SpO2:  [95 %-100 %] 100 % (09/13 2149) Last BM Date: 07/29/13 General:   Alert,  Well-developed, AA female   in NAD Heart:  Regular rate and rhythm; no murmurs Pulm;clear Abdomen:  Soft, mild tenderness and nondistended. Normal bowel sounds, without guarding, and without rebound.   Extremities:  Without edema. Neurologic:  Alert and  oriented x4;  grossly normal neurologically. Psych:  Alert and cooperative. Normal mood and affect.  Intake/Output from previous day: 09/13 0701 - 09/14 0700 In: 3380 [P.O.:1080; I.V.:2300] Out: 3 [Stool:3] Intake/Output this shift:    Lab Results:  Recent Labs  07/27/13 2352  WBC 8.6  HGB 12.9  HCT 37.5  PLT 313   BMET  Recent Labs  07/27/13 2352 07/28/13 0555  NA 136 136  K 3.0* 4.0  CL 99 102  CO2 26 23  GLUCOSE 95 104*  BUN 5* 5*  CREATININE 0.55 0.47*  CALCIUM 9.5 9.1   LFT  Recent Labs  07/28/13 0555  PROT 7.0  ALBUMIN 3.3*  AST 60*  ALT 61*  ALKPHOS 162*  BILITOT 0.3      Assessment / Plan: #1  71 yo female with recurrent choledocholithiasis- s/p ERCP /stone extraction previously, as well as removal of ampullary adenoma . #2 s/p cholecystectomy  Plan- pt is scheduled for ERCP and stone extraction with Dr. Jamesrobert Ohanesian in am Monday- procedure discussed in detail with pt D/C lovenox Cipro pre procedure Possible d/c home later tomorrow  Principal Problem:   Abdominal pain, epigastric Active Problems:   Benign neoplasm of duodenum   Nausea with  vomiting   Hypokalemia   Elevated LFTs     LOS: 3 days   Amy Esterwood  07/30/2013, 11:27 AM   GI ATTENDING  Interval history and laboratories reviewed. Patient seen and examined. Agree with H&P as above. Clinically stable. Still with uncomfortable abdomen. Plan ERCP with stone extraction tomorrow.The nature of the procedure, as well as the risks, benefits, and alternatives were carefully and thoroughly reviewed with the patient. Ample time for discussion and questions allowed. The patient understood, was satisfied, and agreed to proceed.  Macrae Wiegman N. Casaundra Takacs, Jr., M.D. [facility] 

## 2013-08-01 ENCOUNTER — Encounter (HOSPITAL_COMMUNITY): Payer: Self-pay | Admitting: Internal Medicine

## 2013-08-01 NOTE — Discharge Summary (Signed)
Name: Teresa Keller MRN: [id number] DOB: [birth year] 72 y.o. PCP: No Pcp Per Patient  Date of Admission: 07/27/2013 10:42 PM Date of Discharge: 08/01/2013 Attending Physician: Jonah Blue, DO  Discharge Diagnosis:  Principal Problem:   Abdominal pain, epigastric Active Problems:   Benign neoplasm of duodenum   Nausea with vomiting   Hypokalemia   Elevated LFTs  Discharge Medications:   Medication List    STOP taking these medications       pantoprazole 20 MG tablet  Commonly known as:  PROTONIX      TAKE these medications       calcium-vitamin D 500-200 MG-UNIT per tablet  Take 1 tablet by mouth 2 (two) times daily with a meal.     fish oil-omega-3 fatty acids 1000 MG capsule  Take 1 g by mouth 3 (three) times daily.     multivitamin with minerals Tabs tablet  Take 1 tablet by mouth daily.     omeprazole 20 MG capsule  Commonly known as:  PRILOSEC  Take 20 mg by mouth daily.     polyethylene glycol packet  Commonly known as:  MIRALAX / GLYCOLAX  Take 17 g by mouth as directed. 3 days on and 3 days off        Disposition and follow-up:   Teresa Keller was discharged from [facility] in Good condition.  At the hospital follow up visit please address:  1. Please follow up on recurrence of epigastric pain after discharge, tolerance of procedure- ERCP- done 08/01/2013..   2. Patient has mildly elevated blood pressures which has not required medication so far. Life style modifications should be reiterated.   Follow-up Appointments:     Follow-up Information   Follow up with Lars Masson, MD On 08/08/2013. (At 8.15am)    Specialty:  Internal Medicine   Contact information:   [address] [place] [address] [phone]       Discharge Instructions: Discharge Orders   Future Appointments Provider Department Dept Phone   08/08/2013 8:15 AM Courtney Paris, MD [facility] [phone]   08/28/2013  9:15 AM Rachael Fee, MD [facility] [phone]   Future Orders Complete By Expires   Call MD for:  persistant nausea and vomiting  As directed    Call MD for:  severe uncontrolled pain  As directed    Call MD for:  temperature >100.4  As directed    Diet - low sodium heart healthy  As directed    Discharge instructions  As directed    Comments:     Please avoid fatty foods. Follow up with your doctors in clinic as scheduled.   Increase activity slowly  As directed       Consultations: Treatment Team:  Burns Spain, MD  Procedures Performed:  US Abdomen Complete  07/09/2013   *RADIOLOGY REPORT*  Clinical Data:  Epigastric pain, history hypertension  ULTRASOUND ABDOMEN:  Technique:  Sonography of upper abdominal structures was performed.  Comparison:  08/22/2012  Gallbladder:  Surgically absent  Common bile duct:  8 mm diameter, previously 10 mm  Liver:  Normal appearance  IVC:  Normal appearance  Pancreas:  Normal appearance  Spleen:  Normal appearance, 5.1 cm length  Right kidney:  9.3 cm length.  Inferior aspect of inferior pole incompletely visualized.  Visualized portions of right kidney normal appearance.  Left kidney:  10.1 cm length. Normal morphology without mass or hydronephrosis.  Aorta:  Normal caliber  Other:  No free fluid  IMPRESSION: Post cholecystectomy. No acute upper abdominal sonographic abnormalities identified. Decreased size of CBD from 10 mm to 8 mm since previous exam.   Original Report Authenticated By: Ulyses Southward, M.D.   Ct Abdomen Pelvis W Contrast  07/23/2013   *RADIOLOGY REPORT*  Clinical Data: Post prandial pain.  History cholecystectomy.  CT ABDOMEN AND PELVIS WITH CONTRAST  Technique:  Multidetector CT imaging of the abdomen and pelvis was performed following the standard protocol during bolus administration of intravenous contrast.  Contrast: OMNIPAQUE IOHEXOL 300 MG/ML  SOLN  Comparison: CT 08/18/2012  Findings: Lung  bases are clear.  No pericardial fluid.  No focal hepatic lesion.  Post cholecystectomy.  No biliary duct dilatation. The common bile duct is upper limits of normal 6 mm. There is a low density focus within the common bile duct measuring approximately 4 mm seen on axial image 27 and coronal image 47.  It is conceivable this could represent a cholesterol stone in the common bile duct.  No pancreatic inflammation.  The spleen, adrenal glands, and kidneys are normal.  The stomach, small bowel, and cecum are normal.  The colon demonstrates several diverticula in the sigmoid region.  Abdominal aorta normal caliber.  No retroperitoneal periportal lymphadenopathy.  No free fluid the pelvis.  Uterus and ovaries are normal.  No pelvic lymphadenopathy.  Review of  bone windows demonstrates no aggressive osseous lesions.  IMPRESSION:  1.  No acute abdominal pelvic findings.  2.  Small low density lesion within the distal common bile duct. Cannot exclude a cholesterol stone within the common bile duct.  If the patient has any biliary type symptoms, consider MRCP for further evaluation. 2.  Sigmoid diverticulosis without evidence of diverticulitis.   Original Report Authenticated By: Genevive Bi, M.D.   Mr 3d Recon At Scanner  07/29/2013   CLINICAL DATA:  Postprandial abdominal pain. Status post cholecystectomy with history of choledocholithiasis.  EXAM: MRI ABDOMEN WITHOUT AND WITH CONTRAST (INCLUDING MRCP)  TECHNIQUE: Multiplanar multisequence MR imaging of the abdomen was performed both before and after the administration of intravenous contrast. Heavily T2-weighted images of the biliary and pancreatic ducts were obtained, and three-dimensional MRCP images were rendered by post processing.  CONTRAST:  15mL MULTIHANCE GADOBENATE DIMEGLUMINE 529 MG/ML IV SOLN  COMPARISON:  CT abdomen pelvis dated 07/23/2013  FINDINGS: Mild hepatic steatosis. No suspicious/ enhancing hepatic lesion.  Spleen, pancreas, and adrenal glands  are within normal limits.  Status post cholecystectomy. No intrahepatic or extrahepatic ductal dilatation. Common duct measures 8 mm. 3 mm gallstone in the distal common duct just above the ampulla (series 5/ image 28).  Kidneys are within normal limits. No hydronephrosis.  No abdominal ascites.  No suspicious abdominal lymphadenopathy.  No focal osseous lesions.  IMPRESSION: Status post cholecystectomy. No intrahepatic or extrahepatic ductal dilatation. Common duct measures 8 mm.  3 mm gallstone in the distal common duct just above the ampulla. Consider ERCP.  Mild hepatic steatosis.   Electronically Signed   By: Charline Bills M.D.   On: 07/29/2013 12:06   Dg Ercp Biliary & Pancreatic Ducts  07/31/2013   *RADIOLOGY REPORT*  Clinical Data: Common bile duct stones, ERCP  ERCP  Comparison: MRCP - 07/29/2013.  Fluoroscopy time:  6 minutes, 38 seconds  Findings:  14 spot intraoperative radiographic images of the right upper quadrant during ERCP are provided for review.  Initial images demonstrate an ERCP probe overlying the right upper  abdominal quadrant.  Subsequent images demonstrated selective cannulation and opacification of the common bile duct which appears mildly dilated. There are apparent filling defects within the mid and distal aspects of the common bile duct.  Subsequent images demonstrate insufflation of a sphincterotomy balloon within the distal aspect of the CBD. Completion images are degraded due to patient motion and obliquity.  There is minimal opacification of the residual cystic duct.  There is no definitive opacification of the pancreatic duct.  There is minimal opacification of the central intrahepatic biliary system which appears nondilated.  IMPRESSION: ERCP with findings suggestive of choledocholithiasis and subsequent biliary balloon sweeping and sphincterotomy.   Original Report Authenticated By: Tacey Ruiz, MD   Mr Abd W/wo Cm/mrcp  07/29/2013   CLINICAL DATA:  Postprandial  abdominal pain. Status post cholecystectomy with history of choledocholithiasis.  EXAM: MRI ABDOMEN WITHOUT AND WITH CONTRAST (INCLUDING MRCP)  TECHNIQUE: Multiplanar multisequence MR imaging of the abdomen was performed both before and after the administration of intravenous contrast. Heavily T2-weighted images of the biliary and pancreatic ducts were obtained, and three-dimensional MRCP images were rendered by post processing.  CONTRAST:  15mL MULTIHANCE GADOBENATE DIMEGLUMINE 529 MG/ML IV SOLN  COMPARISON:  CT abdomen pelvis dated 07/23/2013  FINDINGS: Mild hepatic steatosis. No suspicious/ enhancing hepatic lesion.  Spleen, pancreas, and adrenal glands are within normal limits.  Status post cholecystectomy. No intrahepatic or extrahepatic ductal dilatation. Common duct measures 8 mm. 3 mm gallstone in the distal common duct just above the ampulla (series 5/ image 28).  Kidneys are within normal limits. No hydronephrosis.  No abdominal ascites.  No suspicious abdominal lymphadenopathy.  No focal osseous lesions.  IMPRESSION: Status post cholecystectomy. No intrahepatic or extrahepatic ductal dilatation. Common duct measures 8 mm.  3 mm gallstone in the distal common duct just above the ampulla. Consider ERCP.  Mild hepatic steatosis.   Electronically Signed   By: Charline Bills M.D.   On: 07/29/2013 12:06   Admission HPI: Chief Complaint: Abdominal Pain   History of Present Illness: Teresa Keller is a 72 y.o. 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 The patient has had N/V and recurrent abdominal pain since August, The patient states that this pain is similar to her gall bladder pain she had in 2012 and 2013. The pain starts ten minutes after eating and lasts for about 45 minutes. The patient is afraid to eat due to this  pain. No weight loss during this time, but admits to vomitting after numerous meals during the last month. Denies blood in emesis. She sought medical care on Aug 24th in ED for this pain. At that time, abdominal U/S and LFTs were WNLs. She returned to the ED on September 7th for continuation of this pain. CT abdomen At that visit demonstrated a small low density lesion within the distal common bile duct possibly a cholesterol stone within the common bile duct. Recommended MRCP is the the patient has any biliary type symptoms. She was sent home with an appointment for MRCP on 9-15. However, she returned to the ED on 9/11 due to worsening pain. At this time, her LFTs had worsened. Internal medicine was consulted to admit the patient for expedited MRCP.  Denies constipation diarrhea, headache, dizziness, weakness, chest pain, SOB.  Hospital Course by problem list:   #  Recurrent Choledocholithiasis- Pt presented with abdominal pain typical of biliary tract tract obstruction, nausea, and vomiting, with elevated Liver enzs- ALP- 195, ALT- 75, AST- 106. Patient has has a cholecystectomy in the past- 0ct 2012. Pt had ERCP done 08/22/2012- Sphincterotomy with stone extraction, and pt also had a duodenal polyp, With excision of the polyp 12/06/2012. f/u ERCP in June 2014 showed a normal ampulla with no adenomatous tissue at the prior polypectomy site. Per CT- 07/23/2013- a cholesterol stone is a possibility. Patient was placed on NPO initially, advanced to clears, and Given IVFs- D5/ Ns, GI was consulted and Pt had an MRCP done on admission- 07/28/2013 which showed 3mm gall stone above the ampulla. Patient was subsequently scheduled for and had an ERCP done-07/31/2013- Sphincterotomy extension and balloon extraction of stones. Patient tolerated the procedure well, abdominal pain resolved and LFTs were on a downward trend,was tolerating regular diet, was stable and was discharged the next day after the procedure.  #  Hypokalemia - On presentation BMP 07/27/2013, showed a K of 3. Patient was given 40 PO of KCl, repeat KCL- 07/28/2013 was 4.0.  # HTN- Patient not on any antihypertensives at home, and was started on any while on admission and on discharge. Bp ranged widely on discharge, high Bp recordings were most likely due to abdominal pain. Bp on discharge- 126/59. Patient should closely follow up on her blood pressure, with lifestyle modifications- Reduced salt, diet modification and exercise.   Discharge Vitals:   BP 126/59  Pulse 71  Temp(Src) 98.1 F (36.7 C) (Oral)  Resp 17  Ht 5\' 2"  (1.575 m)  Wt 145 lb 11.6 oz (66.1 kg)  BMI 26.65 kg/m2  SpO2 96%  Discharge Labs:  Results for orders placed during the hospital encounter of 07/27/13 (from the past 24 hour(s))  GLUCOSE, CAPILLARY     Status: None   Collection Time    07/31/13 12:32 PM      Result Value Range   Glucose-Capillary 98  70 - 99 mg/dL    Signed: Kennis Carina, MD 08/01/2013, 11:02 AM   Time Spent on Discharge: 35 minutes Services Ordered on Discharge: None Equipment Ordered on Discharge: None.

## 2013-08-01 NOTE — Progress Notes (Signed)
  Date: 08/01/2013  Patient name: Teresa Keller  Medical record number: [id number]  Date of birth: 30-Nov-1940   This patient has been seen and the plan of care was discussed with the house staff. Please see their note for complete details. I concur with their findings with the following additions/corrections: [name] is eating well without N/V. She is ready for D/C.  Burns Spain, MD 08/01/2013, 11:23 AM

## 2013-08-01 NOTE — Progress Notes (Signed)
     [facility] Daily Rounding Note 08/01/2013, 9:47 AM  SUBJECTIVE:       Feels great.  No abd pain.  No nausea.   OBJECTIVE:         Vital signs in last 24 hours:    Temp:  [98.1 F (36.7 C)-98.6 F (37 C)] 98.1 F (36.7 C) (09/16 0451) Pulse Rate:  [66-80] 71 (09/16 0451) Resp:  [8-53] 17 (09/16 0451) BP: (101-198)/(50-113) 126/59 mmHg (09/16 0451) SpO2:  [96 %-100 %] 96 % (09/16 0451) Last BM Date: 07/30/13 General: looks well.  Very comfortable   Heart: RRR Chest: clear bil.  No cough or dyspnea Abdomen: soft, NT, ND.  Active BS  Extremities: no CCE Neuro/Psych:  Pleasant, oriented fully and appropriate.  No distress.   Intake/Output from previous day: 09/15 0701 - 09/16 0700 In: 1890 [I.V.:1690; IV Piggyback:200] Out: -    No new labs.   Studies/Results: Dg Ercp Biliary & Pancreatic Ducts  07/31/2013   *RADIOLOGY REPORT*  Clinical Data: Common bile duct stones, ERCP  ERCP  Comparison: MRCP - 07/29/2013.  Fluoroscopy time:  6 minutes, 38 seconds  Findings:  14 spot intraoperative radiographic images of the right upper quadrant during ERCP are provided for review.  Initial images demonstrate an ERCP probe overlying the right upper abdominal quadrant.  Subsequent images demonstrated selective cannulation and opacification of the common bile duct which appears mildly dilated. There are apparent filling defects within the mid and distal aspects of the common bile duct.  Subsequent images demonstrate insufflation of a sphincterotomy balloon within the distal aspect of the CBD. Completion images are degraded due to patient motion and obliquity.  There is minimal opacification of the residual cystic duct.  There is no definitive opacification of the pancreatic duct.  There is minimal opacification of the central intrahepatic biliary system which appears nondilated.  IMPRESSION: ERCP with findings suggestive of choledocholithiasis and subsequent biliary balloon sweeping and  sphincterotomy.   Original Report Authenticated By: Tacey Ruiz, MD    ASSESMENT: *  Recurrent choledocholithiasis.  S/p 9/15 ERCP with sphincterotomy extension and stone extraction. Previous ERCP/spint/stone extraction 08/2011.  *  S/p endoscopic removal of duodenal adenomatous polyp at [facility] 11/2012 (Dr Othelia Pulling)   PLAN: *  Discharge home this AM.    LOS: 5 days   Teresa Keller  08/01/2013, 9:47 AM Pager: [phone]  GI ATTENDING  Patient looks great post procedure. Ready for discharge.  Wilhemina Bonito. Eda Keys., M.D. [facility]

## 2013-08-01 NOTE — Progress Notes (Signed)
Patient discharged to home. Patient AVS reviewed with patient and patient's daughter. Patient verbalized understanding of medications and follow-up appointments.  Patient remains stable; no signs or symptoms of distress.  Patient educated to return to the ER in cases of SOB, dizziness, fever, chest pain, or fainting.

## 2013-08-01 NOTE — Progress Notes (Signed)
Subjective: Eager to go home. Had ERCP yesterday. Tolerating regular diet. No fever, nausea or vomiting.   Objective: Vital signs in last 24 hours: Filed Vitals:   07/31/13 1236 07/31/13 1658 07/31/13 2132 08/01/13 0451  BP: 101/57 140/68 124/69 126/59  Pulse: 66 72 80 71  Temp: 98.1 F (36.7 C) 98.3 F (36.8 C) 98.3 F (36.8 C) 98.1 F (36.7 C)  TempSrc: Oral Oral Oral Oral  Resp: 18 18 18 17   Height:      Weight:      SpO2: 98% 99% 100% 96%   Weight change:   Intake/Output Summary (Last 24 hours) at 08/01/13 1028 Last data filed at 08/01/13 0900  Gross per 24 hour  Intake   1930 ml  Output      0 ml  Net   1930 ml   Physical exam Vitals reviewed. General: Sitting up in a chair, NAD HEENT: PERRL, EOMI, no scleral icterus Cardiac: RRR, no rubs, murmurs or gallops Pulm: clear to auscultation bilaterally, no wheezes, rales, or rhonchi Abd: soft, no epigastric tenderness, nondistended, BS present Ext: warm and well perfused, no pedal edema Neuro: alert and oriented X3, cranial nerves II-XII grossly intact, strength and sensation to light touch equal in bilateral upper and lower extremities.  Lab Results: Basic Metabolic Panel:  Recent Labs Lab 07/27/13 2352 07/28/13 0555  NA 136 136  K 3.0* 4.0  CL 99 102  CO2 26 23  GLUCOSE 95 104*  BUN 5* 5*  CREATININE 0.55 0.47*  CALCIUM 9.5 9.1   Liver Function Tests:  Recent Labs Lab 07/27/13 2352 07/28/13 0555  AST 106* 60*  ALT 75* 61*  ALKPHOS 195* 162*  BILITOT 0.3 0.3  PROT 7.5 7.0  ALBUMIN 3.6 3.3*    Recent Labs Lab 07/27/13 2352  LIPASE 23   CBC:  Recent Labs Lab 07/27/13 2352  WBC 8.6  NEUTROABS 6.7  HGB 12.9  HCT 37.5  MCV 81.7  PLT 313    Studies/Results: Mr 3d Recon At Scanner  07/29/2013   CLINICAL DATA:  Postprandial abdominal pain. Status post cholecystectomy with history of choledocholithiasis.   IMPRESSION: Status post cholecystectomy. No intrahepatic or extrahepatic  ductal dilatation. Common duct measures 8 mm.  3 mm gallstone in the distal common duct just above the ampulla. Consider ERCP.  Mild hepatic steatosis.   Electronically Signed   By: Charline Bills M.D.   On: 07/29/2013 12:06   Mr Abd W/wo Cm/mrcp  07/29/2013   CLINICAL DATA:  Postprandial abdominal pain. Status post cholecystectomy with history of choledocholithiasis.    IMPRESSION: Status post cholecystectomy. No intrahepatic or extrahepatic ductal dilatation. Common duct measures 8 mm.  3 mm gallstone in the distal common duct just above the ampulla. Consider ERCP.  Mild hepatic steatosis.   Electronically Signed   By: Charline Bills M.D.   On: 07/29/2013 12:06   Medications: I have reviewed the patient's current medications. Scheduled Meds: . pantoprazole  40 mg Oral Daily   Continuous Infusions: . dextrose 5 % and 0.9% NaCl 100 mL/hr at 08/01/13 0950   PRN Meds:. Assessment/Plan:  # Abdominal Pain with elevated transaminases:  Likely due to obstruction of the common bile duct. Per recent CT, a cholesterol stone is a possibility. ERCP in June 2014 showed a ampulla with normal with no adenomatous tissue at the prior polypectomy site. GI was consulted and performed an MRCP on 9/13 which showed one distal CBD stone and possible stenosis which may be  from her prior sphincterotomy. ERCP done yesterday- spincterotomy extension with balloon extraction of stones. - GI recommendation appreciated, discharge home   - Follow up with PCP. # Hypokalemia:  On admission- K of 3.0, given KCl,  K corrected to 4.0.  Recent Labs Lab 07/27/13 2352 07/28/13 0555  K 3.0* 4.0    # DVT PPx: Lovenox.  Dispo:  Possible d/c to home today after ERCP.    The patient does have a current PCP (No Pcp Per Patient) and might possibly need an [facility] hospital follow-up appointment after discharge.  The patient does not know have transportation limitations that hinder transportation to clinic  appointments.  .Services Needed at time of discharge: Y = Yes, Blank = No PT:   OT:   RN:   Equipment:   Other:     LOS: 5 days   Kennis Carina, MD 08/01/2013, 10:28 AM

## 2013-08-02 NOTE — Discharge Summary (Signed)
  Date: 08/02/2013  Patient name: Teresa Keller  Medical record number: [id number]  Date of birth: 07/19/1941   This patient has been discussed with the house staff. Please see their note for complete details. I concur with their findings and plan.  Jonah Blue, DO, FACP Faculty [facility] Internal Medicine Residency Program 08/02/2013, 11:34 AM

## 2013-08-08 ENCOUNTER — Encounter: Payer: Self-pay | Admitting: Internal Medicine

## 2013-08-08 ENCOUNTER — Ambulatory Visit (INDEPENDENT_AMBULATORY_CARE_PROVIDER_SITE_OTHER): Payer: BC Managed Care – PPO | Admitting: Internal Medicine

## 2013-08-08 VITALS — BP 116/68 | HR 77 | Temp 97.9°F | Ht 62.0 in | Wt 149.1 lb

## 2013-08-08 DIAGNOSIS — I10 Essential (primary) hypertension: Secondary | ICD-10-CM

## 2013-08-08 DIAGNOSIS — K8050 Calculus of bile duct without cholangitis or cholecystitis without obstruction: Secondary | ICD-10-CM | POA: Insufficient documentation

## 2013-08-08 LAB — COMPREHENSIVE METABOLIC PANEL
ALT: 11 U/L (ref 0–35)
AST: 13 U/L (ref 0–37)
Albumin: 4 g/dL (ref 3.5–5.2)
Calcium: 9.9 mg/dL (ref 8.4–10.5)
Chloride: 101 mEq/L (ref 96–112)
Potassium: 4.7 mEq/L (ref 3.5–5.3)
Sodium: 137 mEq/L (ref 135–145)
Total Protein: 7.4 g/dL (ref 6.0–8.3)

## 2013-08-08 NOTE — Progress Notes (Signed)
Patient ID: Teresa Keller, female   DOB: [birth year], 72 y.o.   MRN: [id number]   Subjective:   Patient ID: Teresa Keller female   DOB: 1941-01-14 72 y.o.   MRN: [id number]  HPI: Teresa Keller is a 72 y.o. F w/ PMHx of OA and mild HTN, presents to the clinic today for hospital follow up. The patient was recently discharged from [facility] on 08/01/13 for recurrent choledocholithiasis. ERCP was performed and multiple stones were extracted from the bile duct and sphincterotomy was performed. Patient tolerated procedure well and LFT's began to trend down. Patient has had previous ERCP in 08/2012 where multiple stones were removed at that time as w3ell. Patient has had cholecystectomy in 2012.  Today, patient reports feeling well and denies any recent abdominal pain, nausea, vomiting, or diarrhea. She says she has been tolerating a normal diet since her discharge. She denies any other issues at this time. No SOB, chest pain, dizziness, lightheadedness, palpitations, fever, chills, or dysuria.   Past Medical History  Diagnosis Date  . Arthritis   . Uncontrolled hypertension, stage 1 08/26/2012   Current Outpatient Prescriptions  Medication Sig Dispense Refill  . Calcium Carbonate-Vitamin D (CALCIUM-VITAMIN D) 500-200 MG-UNIT per tablet Take 1 tablet by mouth 2 (two) times daily with a meal.      . fish oil-omega-3 fatty acids 1000 MG capsule Take 1 g by mouth 3 (three) times daily.      . Multiple Vitamin (MULTIVITAMIN WITH MINERALS) TABS Take 1 tablet by mouth daily.      Marland Kitchen omeprazole (PRILOSEC) 20 MG capsule Take 20 mg by mouth daily.      . polyethylene glycol (MIRALAX / GLYCOLAX) packet Take 17 g by mouth as directed. 3 days on and 3 days off       No current facility-administered medications for this visit.   Family History  Problem Relation Age of Onset  . Hypertension Mother   . Hypertension Sister   . Colon cancer Mother    History   Social History  . Marital Status: Widowed   Spouse Name: N/A    Number of Children: N/A  . Years of Education: N/A   Social History Main Topics  . Smoking status: Never Smoker   . Smokeless tobacco: Never Used  . Alcohol Use: No  . Drug Use: No  . Sexual Activity: No   Other Topics Concern  . None   Social History Narrative  . None   Review of Systems: General: Denies fever, chills, diaphoresis, appetite change and fatigue.  Respiratory: Denies SOB, DOE, cough, chest tightness, and wheezing.   Cardiovascular: Denies chest pain, palpitations and leg swelling.  Gastrointestinal: Positive for infrequent mild epigastric pain. Much improved. Denies nausea, vomiting, diarrhea, constipation, blood in stool and abdominal distention.  Genitourinary: Denies dysuria, urgency, frequency, hematuria, flank pain and difficulty urinating.  Endocrine: Denies hot or cold intolerance, sweats, polyuria, polydipsia. Musculoskeletal: Positive for chronic hip pain. Denies myalgias, back pain, joint swelling, and gait problem.  Skin: Denies pallor, rash and wounds.  Neurological: Denies dizziness, seizures, syncope, weakness, lightheadedness, numbness and headaches.  Psychiatric/Behavioral: Denies mood changes, confusion, nervousness, sleep disturbance and agitation.  Objective:   Filed Vitals:   08/08/13 0818  BP: 116/68  Pulse: 77  Temp: 97.9 F (36.6 C)  TempSrc: Oral  Height: 5\' 2"  (1.575 m)  Weight: 149 lb 1.6 oz (67.631 kg)  SpO2: 100%   General: Vital signs reviewed.  Patient is a well-developed and  well-nourished, in no acute distress and cooperative with exam. Alert and oriented x3.  Head: Normocephalic and atraumatic. Eyes: PERRL, EOMI, conjunctivae normal, No scleral icterus.  Neck: Supple, trachea midline, normal ROM, No JVD, masses, thyromegaly, or carotid bruit present.  Cardiovascular: RRR, S1 normal, S2 normal, no murmurs, gallops, or rubs. Pulmonary/Chest: normal respiratory effort, CTAB, no wheezes, rales, or  rhonchi. Abdominal: Soft. Mild tenderness to deep palpation in epigastric region. Non-distended, bowel sounds are normal, no masses, organomegaly, or guarding present.  Musculoskeletal: No joint deformities, erythema, or stiffness, ROM full and no nontender. Extremities: No swelling or edema,  pulses symmetric and intact bilaterally. No cyanosis or clubbing. Neurological: A&O x3, Strength is normal and symmetric bilaterally, cranial nerve II-XII are grossly intact, no focal motor deficit, sensory intact to light touch bilaterally.  Skin: Warm, dry and intact. No rashes or erythema. Psychiatric: Normal mood and affect. speech and behavior is normal. Cognition and memory are normal.   Assessment & Plan:   Please see problem-based assessment and plan.

## 2013-08-08 NOTE — Patient Instructions (Signed)
1. Please call to make an appointment if you feel you need to return to the clinic. 2. Please take all medications as prescribed.  3. If you have worsening of your symptoms or new symptoms arise, please call the clinic ([phone]), or go to the ER immediately if symptoms are severe.  You have done great job in taking all your medications. I appreciate it very much. Please continue doing that.

## 2013-08-08 NOTE — Assessment & Plan Note (Signed)
Blood pressure well controlled today, 116/68. Not taking any medications for this.

## 2013-08-08 NOTE — Assessment & Plan Note (Signed)
Patient recently discharged from [facility] on 08/01/13 after epigastric pain w/ N/V + elevated transaminases. ERCP performed on 07/31/13, removed multiple stones, sphincterotomy performed. Patient has tolerated procedure well, says she is feeling back to her baseline. No recent N/V/D. Still with very mild, infrequent epigastric pain w/ minimal tenderness to deep palpation. -Will repeat CMET today.

## 2013-08-10 NOTE — Progress Notes (Signed)
I saw and evaluated the patient.  I personally confirmed the key portions of the history and exam documented by Dr. Jones and I reviewed pertinent patient test results.  The assessment, diagnosis, and plan were formulated together and I agree with the documentation in the resident's note.   

## 2013-08-28 ENCOUNTER — Ambulatory Visit (INDEPENDENT_AMBULATORY_CARE_PROVIDER_SITE_OTHER): Payer: BC Managed Care – PPO | Admitting: Gastroenterology

## 2013-08-28 ENCOUNTER — Encounter: Payer: Self-pay | Admitting: Gastroenterology

## 2013-08-28 VITALS — BP 120/70 | HR 73 | Ht 62.0 in | Wt 146.4 lb

## 2013-08-28 DIAGNOSIS — K8050 Calculus of bile duct without cholangitis or cholecystitis without obstruction: Secondary | ICD-10-CM

## 2013-08-28 MED ORDER — URSODIOL 500 MG PO TABS: 500.0000 mg | ORAL_TABLET | Freq: Two times a day (BID) | ORAL | Status: DC

## 2013-08-28 NOTE — Patient Instructions (Signed)
Please start urso 500mg  pill, one pill twice daily.  New script was called in for you. Call here if you think you are having gallstone problems again.

## 2013-08-28 NOTE — Progress Notes (Signed)
Review of pertinent gastrointestinal problems:  1. laparoscopic cholecystectomy 09-04-11 by Dr. Corliss Skains. The pathology report confirmed chronic Cholecystitis. The patient then developed an incarcerated Ventral hernia, at the site of the umbilical trocar site. This a required repair by Dr. Ezzard Standing on 09/07/11. The patient reports that they are feeling well with normal bowel movements and good appetite. The pre-operative symptoms of abdominal pain, nausea, and vomiting have resolved. She is back to a normal diet.  2. CBD stone causing jaundice 10/13: ERCP Dr. Arlyce Dice biliary sphincterotomy, small stone removed. Also found 2.5cm periamp duodenal polyp, was adenomatous on mucosal biopsies;  Recurrent pain, elevated LFTs (alk phos up to 200, transaminases elevated), MRCP showed CBD stone, underwent repeat ERCP Dr. Marina Goodell 07/2013 sphincterotomy extended, stones removed; lfts normalized 2 weeks later). 3. 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 Othelia Pulling at [facility] who removed the adenoma January 2014. [facility]   HPI: This is a  very pleasant 72 year old woman whom I last saw several months ago. Since then she had recurrence abdominal pains which she felt was due to bile duct stone again and she was eventually proven to be exactly right with MRI, liver tests were elevated. I was out of town and so one of my partners, Dr. Marina Goodell, performed ERCP last month.  ERCP last month for recurrent CBD stones (lap chole 2012).  Has been feeling well the past 1-2 weeks.   LFTs normalized.  Eating well.   Past Medical History  Diagnosis Date  . Arthritis   . Uncontrolled hypertension, stage 1 08/26/2012    Past Surgical History  Procedure Laterality Date  . Tubal ligation    . Cholecystectomy  october 2012  . Ercp  08/22/2012    Procedure: ENDOSCOPIC RETROGRADE CHOLANGIOPANCREATOGRAPHY (ERCP);  Surgeon: Louis Meckel, MD;   Location: [facility] OR;  Service: Endoscopy;  Laterality: N/A;  . Eus  11/21/2012    Procedure: UPPER ENDOSCOPIC ULTRASOUND (EUS) LINEAR;  Surgeon: Rachael Fee, MD;  Location: [facility] ENDOSCOPY;  Service: Endoscopy;  Laterality: N/A;  . Esophagogastroduodenoscopy  04/25/13    Surveillance at [facility] - Normal appearing ampulla; No residual adenomatous tissue seen  . Ventral hernia repair  08/2011    s/p chole, at trochar site, by Dr. Ezzard Standing  . Ercp N/A 07/31/2013    Procedure: ENDOSCOPIC RETROGRADE CHOLANGIOPANCREATOGRAPHY (ERCP);  Surgeon: Hilarie Fredrickson, MD;  Location: [facility] ENDOSCOPY;  Service: Endoscopy;  Laterality: N/A;    Current Outpatient Prescriptions  Medication Sig Dispense Refill  . Calcium Carbonate-Vitamin D (CALCIUM-VITAMIN D) 500-200 MG-UNIT per tablet Take 1 tablet by mouth 2 (two) times daily with a meal.      . fish oil-omega-3 fatty acids 1000 MG capsule Take 1 g by mouth 3 (three) times daily.      . Multiple Vitamin (MULTIVITAMIN WITH MINERALS) TABS Take 1 tablet by mouth daily.      Marland Kitchen omeprazole (PRILOSEC) 20 MG capsule Take 20 mg by mouth daily.      . polyethylene glycol (MIRALAX / GLYCOLAX) packet Take 17 g by mouth as directed. 3 days on and 3 days off       No current facility-administered medications for this visit.    Allergies as of 08/28/2013 - Review Complete 08/28/2013  Allergen Reaction Noted  . Codeine    . Sulfur  07/27/2013    Family History  Problem Relation Age of Onset  . Hypertension Mother   .  Hypertension Sister   . Colon cancer Mother     History   Social History  . Marital Status: Widowed    Spouse Name: N/A    Number of Children: N/A  . Years of Education: N/A   Occupational History  . Not on file.   Social History Main Topics  . Smoking status: Never Smoker   . Smokeless tobacco: Never Used  . Alcohol Use: No  . Drug Use: No  . Sexual Activity: No   Other Topics Concern  . Not on file   Social History Narrative  . No  narrative on file      Physical Exam: BP 120/70  Pulse 73  Ht 5\' 2"  (1.575 m)  Wt 146 lb 6 oz (66.395 kg)  BMI 26.77 kg/m2  SpO2 99% Constitutional: generally well-appearing Psychiatric: alert and oriented x3 Abdomen: soft, nontender, nondistended, no obvious ascites, no peritoneal signs, normal bowel sounds     Assessment and plan: 72 y.o. female with recurrent choledocholithiasis  She has had confirmed cbd stones twice since her gallbladder was removed.  I'm going to try her on ursodiol 500 mg twice daily to see if this will help prevent future CBD stone formation. She knows to call this office if she feels she is having biliary type pains again. If that is the case, I will proceed with liver tests and if her LFTs are again elevated then a ERCP will be scheduled, I see no need to repeat MRI given her history.

## 2013-10-02 IMAGING — RF DG ERCP WO/W SPHINCTEROTOMY
1 series · 15 of 16 positions shown · non-contrast
Comparison: CT 08/18/2012, ultrasound 08/22/2012

CLINICAL DATA: Common bile duct stones

ERCP with sphincterotomy
TECHNIQUE: Multiple spot images obtained with the fluoroscopic
device and submitted for interpretation post-procedure.  ERCP was
performed by Dr. Danoodarry.

[Series 1: run · 15 of 16 slices shown]
[im 1/16]
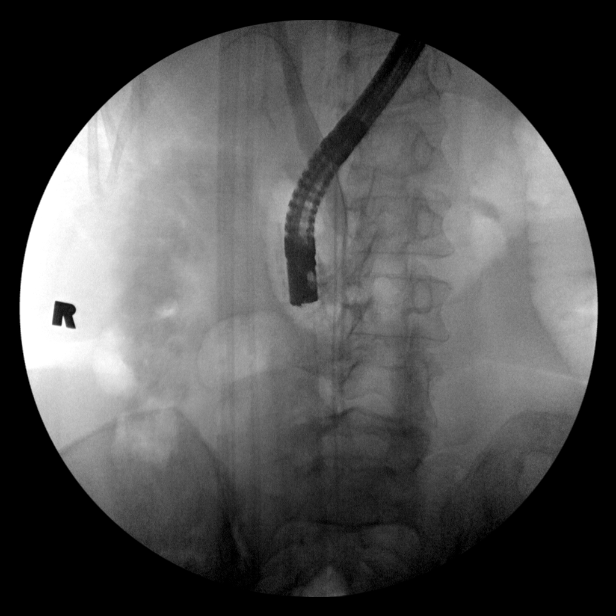
[im 2/16]
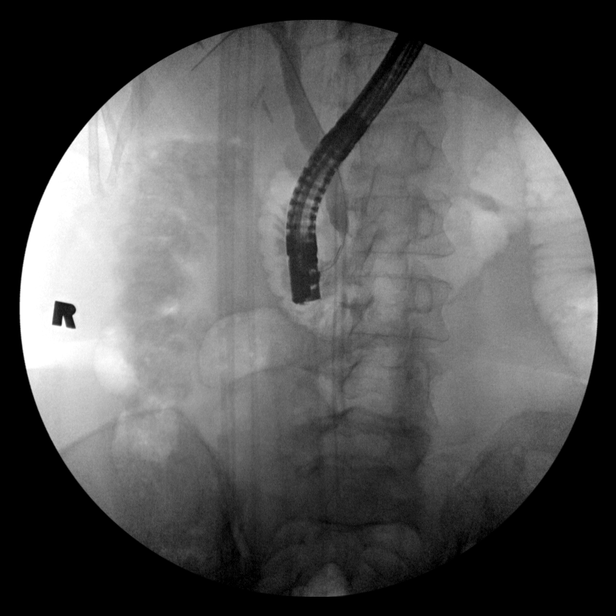
[im 3/16]
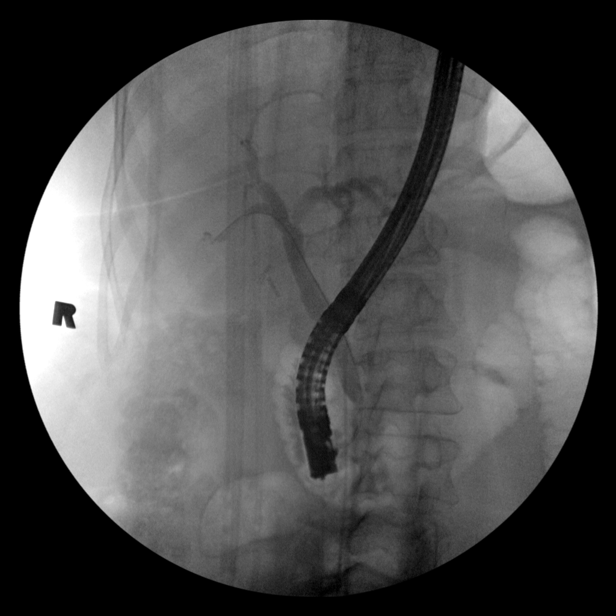
[im 4/16]
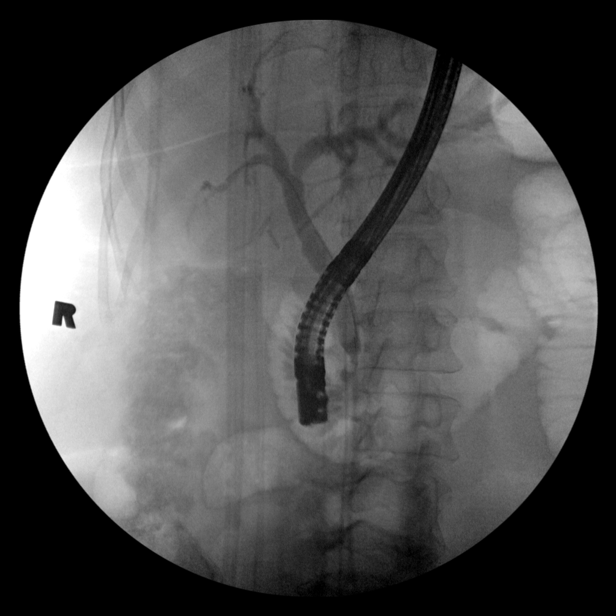
[im 5/16]
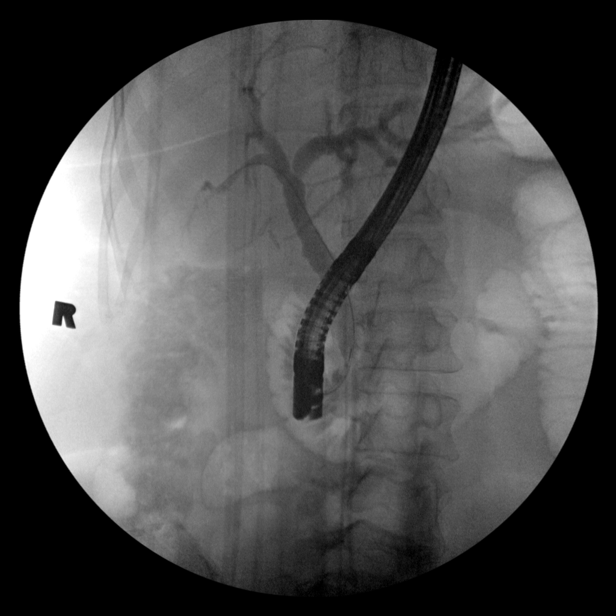
[im 6/16]
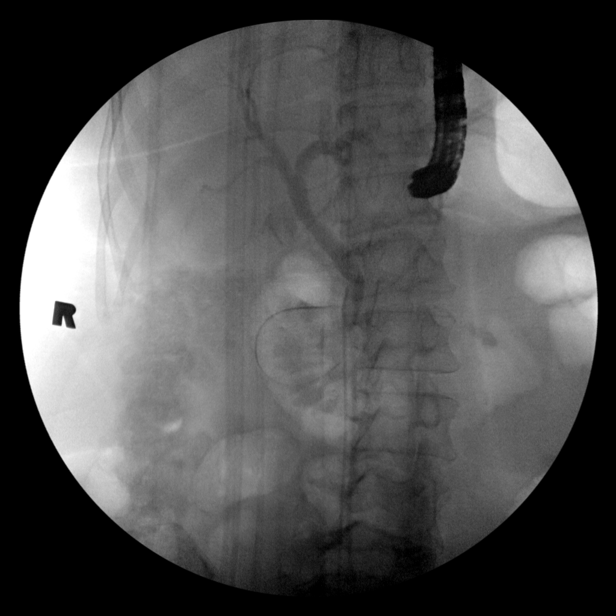
[im 7/16]
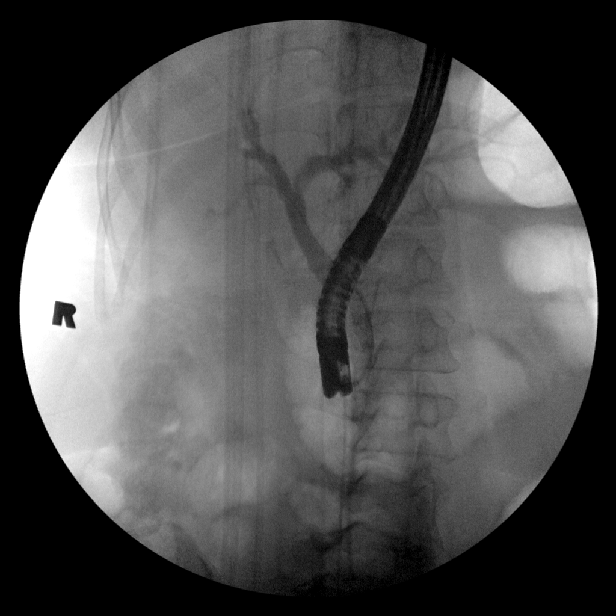
[im 9/16]
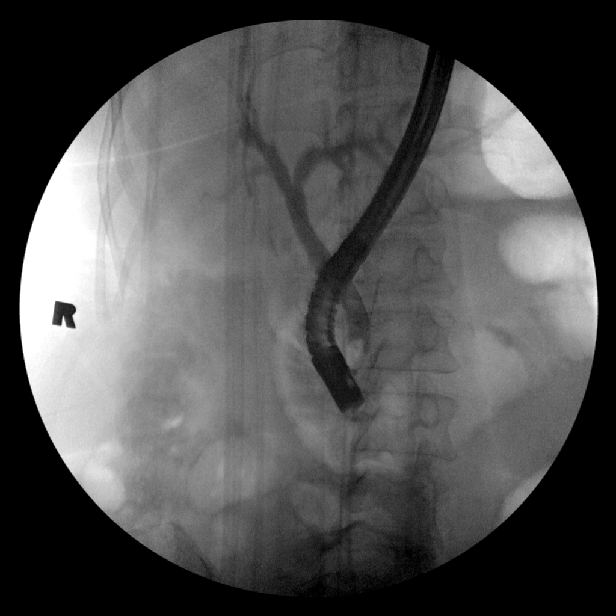
[im 10/16]
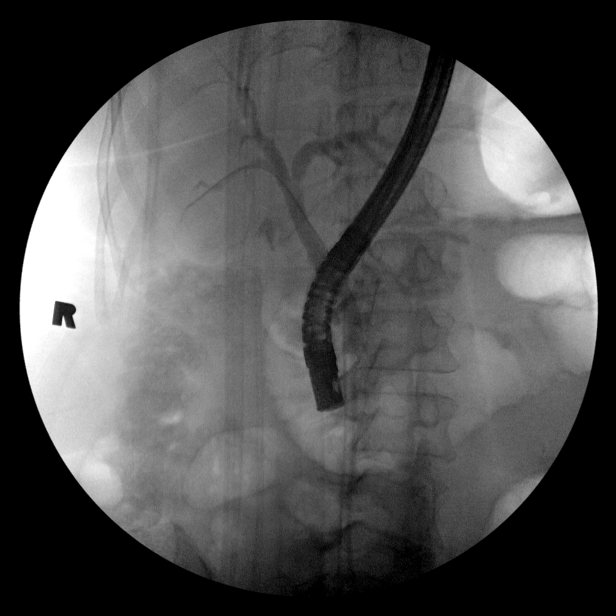
[im 11/16]
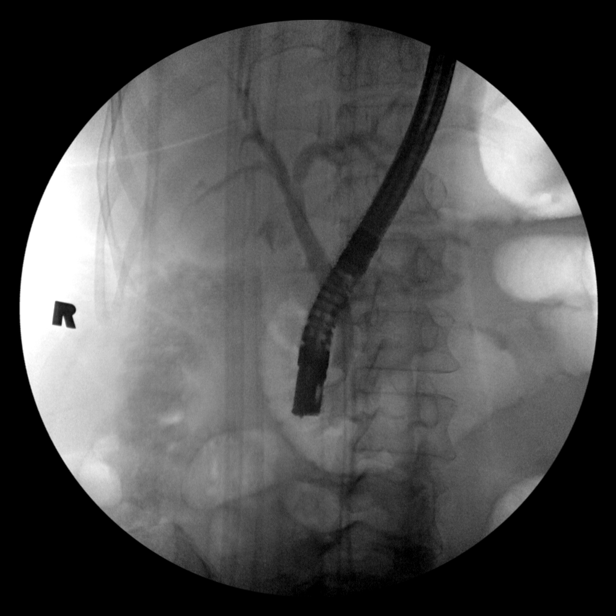
[im 12/16]
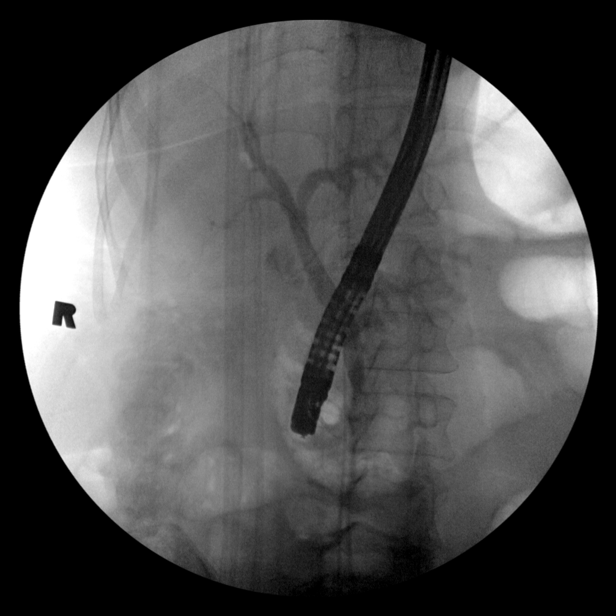
[im 13/16]
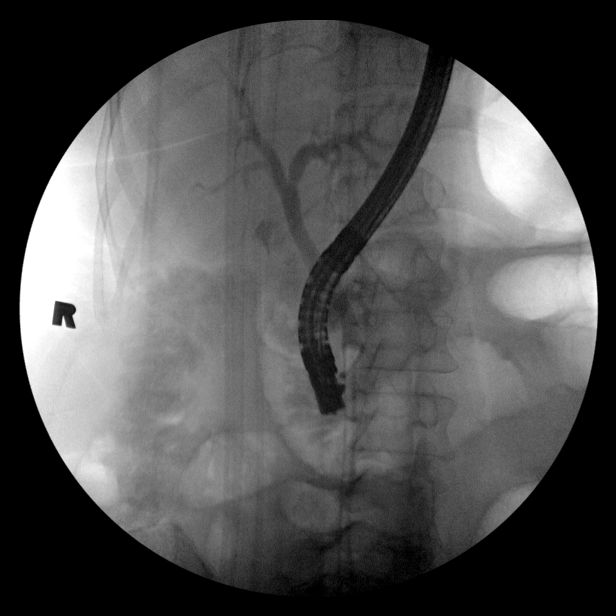
[im 14/16]
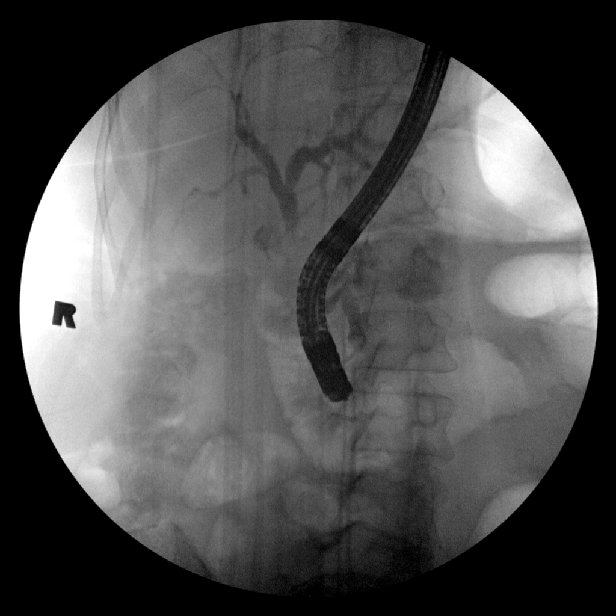
[im 15/16]
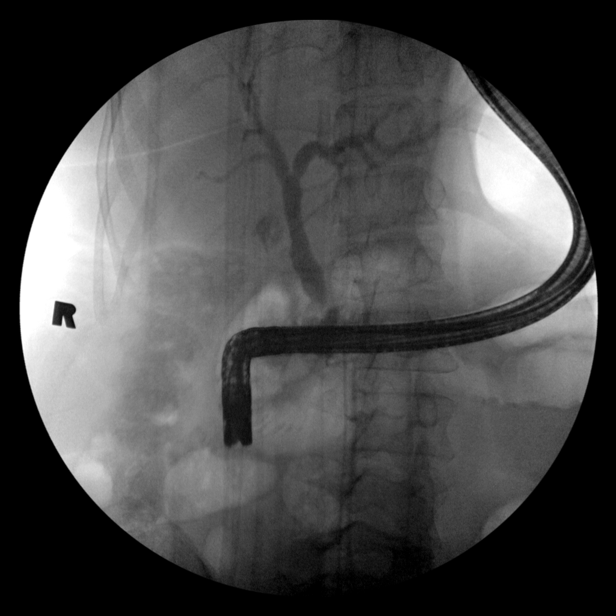
[im 16/16]
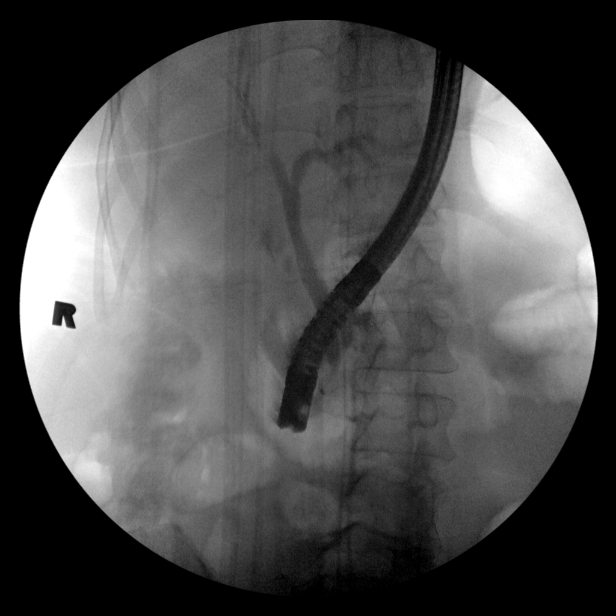

[15 of 16 positions shown; findings below may reference images not displayed]

FINDINGS: Right upper quadrant clips are in place.  On images
labeled #4 and #5, there is a mobile distal common duct filling
defect.  After balloon sweep, on the last image of the exam, there
is no persistent visualized filling defect and contrast excretion
into duodenum noted.  No common duct dilatation.  16 images
submitted for evaluation.
IMPRESSION: Transient distal common duct filling defect which could represent
retained stone or gas bubble.  No persistent defect on last image
of the study (allowing for partial obscuration of the distal common
duct).

These images were submitted for radiologic interpretation only.
Please see the procedural report for the amount of contrast and the
fluoroscopy time utilized.

## 2013-12-24 ENCOUNTER — Encounter (HOSPITAL_COMMUNITY): Payer: Self-pay | Admitting: Emergency Medicine

## 2013-12-24 ENCOUNTER — Observation Stay (HOSPITAL_COMMUNITY)
Admission: EM | Admit: 2013-12-24 | Discharge: 2013-12-25 | Disposition: A | Discharge status: Home / Self Care | Payer: BC Managed Care – PPO | Attending: Infectious Disease | Admitting: Infectious Disease

## 2013-12-24 ENCOUNTER — Emergency Department (HOSPITAL_COMMUNITY): Payer: BC Managed Care – PPO

## 2013-12-24 DIAGNOSIS — Z885 Allergy status to narcotic agent status: Secondary | ICD-10-CM | POA: Insufficient documentation

## 2013-12-24 DIAGNOSIS — K219 Gastro-esophageal reflux disease without esophagitis: Secondary | ICD-10-CM

## 2013-12-24 DIAGNOSIS — Z888 Allergy status to other drugs, medicaments and biological substances status: Secondary | ICD-10-CM | POA: Insufficient documentation

## 2013-12-24 DIAGNOSIS — I10 Essential (primary) hypertension: Secondary | ICD-10-CM | POA: Insufficient documentation

## 2013-12-24 DIAGNOSIS — R7303 Prediabetes: Secondary | ICD-10-CM

## 2013-12-24 DIAGNOSIS — D132 Benign neoplasm of duodenum: Secondary | ICD-10-CM

## 2013-12-24 DIAGNOSIS — Z79899 Other long term (current) drug therapy: Secondary | ICD-10-CM | POA: Insufficient documentation

## 2013-12-24 DIAGNOSIS — R0789 Other chest pain: Principal | ICD-10-CM | POA: Insufficient documentation

## 2013-12-24 DIAGNOSIS — K8050 Calculus of bile duct without cholangitis or cholecystitis without obstruction: Secondary | ICD-10-CM

## 2013-12-24 DIAGNOSIS — D649 Anemia, unspecified: Secondary | ICD-10-CM

## 2013-12-24 DIAGNOSIS — M129 Arthropathy, unspecified: Secondary | ICD-10-CM | POA: Insufficient documentation

## 2013-12-24 DIAGNOSIS — R079 Chest pain, unspecified: Secondary | ICD-10-CM | POA: Diagnosis present

## 2013-12-24 DIAGNOSIS — R110 Nausea: Secondary | ICD-10-CM | POA: Insufficient documentation

## 2013-12-24 LAB — CBC
HCT: 38.7 % (ref 36.0–46.0)
HEMOGLOBIN: 13.3 g/dL (ref 12.0–15.0)
MCH: 29 pg (ref 26.0–34.0)
MCHC: 34.4 g/dL (ref 30.0–36.0)
MCV: 84.3 fL (ref 78.0–100.0)
PLATELETS: 283 10*3/uL (ref 150–400)
RBC: 4.59 MIL/uL (ref 3.87–5.11)
RDW: 15.4 % (ref 11.5–15.5)
WBC: 8.1 10*3/uL (ref 4.0–10.5)

## 2013-12-24 LAB — POCT I-STAT TROPONIN I: Troponin i, poc: 0 ng/mL (ref 0.00–0.08)

## 2013-12-24 LAB — BASIC METABOLIC PANEL
BUN: 8 mg/dL (ref 6–23)
CALCIUM: 10 mg/dL (ref 8.4–10.5)
CHLORIDE: 102 meq/L (ref 96–112)
CO2: 25 meq/L (ref 19–32)
CREATININE: 0.52 mg/dL (ref 0.50–1.10)
GFR calc Af Amer: >90 mL/min (ref >90)
GFR calc non Af Amer: >90 mL/min (ref >90)
GLUCOSE: 106 mg/dL — AB (ref 70–99)
Potassium: 3.9 mEq/L (ref 3.7–5.3)
Sodium: 142 mEq/L (ref 137–147)

## 2013-12-24 LAB — PRO B NATRIURETIC PEPTIDE: PRO B NATRI PEPTIDE: 34.3 pg/mL (ref 0–125)

## 2013-12-24 LAB — TROPONIN I

## 2013-12-24 MED ORDER — SODIUM CHLORIDE 0.9 % IV BOLUS (SEPSIS)
1000.0000 mL | Freq: Once | INTRAVENOUS | Status: AC
  Administered 2013-12-25: 1000 mL via INTRAVENOUS

## 2013-12-24 MED ORDER — NITROGLYCERIN 0.4 MG SL SUBL
0.4000 mg | SUBLINGUAL_TABLET | SUBLINGUAL | Status: DC | PRN
  Administered 2013-12-25: 0.4 mg via SUBLINGUAL

## 2013-12-24 NOTE — ED Notes (Signed)
PA at bedside.

## 2013-12-24 NOTE — ED Provider Notes (Signed)
CSN: [id number]     Arrival date & time 12/24/13  2124 History   First MD Initiated Contact with Patient 12/24/13 2209     Chief Complaint  Patient presents with  . Chest Pain   (Consider location/radiation/quality/duration/timing/severity/associated sxs/prior Treatment) HPI Comments: Patient is a 73 year old female past medical history significant for hypertension, arthritis presenting to the emergency department for chest pain that began this morning when she woke up. She states the central pressure with radiation to left side of her chest. She states this pain lasted 40 minutes to an hour resolved and then returned at 6 PM this evening and has continued until arrival in the emergency department. Chief the second round of chest pain was more severe than the first she states she attempted to take 325 mg of aspirin but did not tolerate it due to nausea and vomiting. Patient denies any associated shortness of breath, lightheadedness. Patient has new previous echocardiograms, stress test, cardiac catheterization.   Patient is a 73 y.o. female presenting with chest pain.  Chest Pain Associated symptoms: nausea   Associated symptoms: no fever     Past Medical History  Diagnosis Date  . Arthritis   . Uncontrolled hypertension, stage 1 08/26/2012   Past Surgical History  Procedure Laterality Date  . Tubal ligation    . Cholecystectomy  october 2012  . Ercp  08/22/2012    Procedure: ENDOSCOPIC RETROGRADE CHOLANGIOPANCREATOGRAPHY (ERCP);  Surgeon: Inda Castle, MD;  Location: [place];  Service: Endoscopy;  Laterality: N/A;  . Eus  11/21/2012    Procedure: UPPER ENDOSCOPIC ULTRASOUND (EUS) LINEAR;  Surgeon: Milus Banister, MD;  Location: [facility] ENDOSCOPY;  Service: Endoscopy;  Laterality: N/A;  . Esophagogastroduodenoscopy  04/25/13    Surveillance at [facility] - Normal appearing ampulla; No residual adenomatous tissue seen  . Ventral hernia repair  08/2011    s/p chole, at trochar site, by Dr.  Lucia Gaskins  . Ercp N/A 07/31/2013    Procedure: ENDOSCOPIC RETROGRADE CHOLANGIOPANCREATOGRAPHY (ERCP);  Surgeon: Irene Shipper, MD;  Location: [facility] ENDOSCOPY;  Service: Endoscopy;  Laterality: N/A;   Family History  Problem Relation Age of Onset  . Hypertension Mother   . Hypertension Sister   . Colon cancer Mother    History  Substance Use Topics  . Smoking status: Never Smoker   . Smokeless tobacco: Never Used  . Alcohol Use: No   OB History   Grav Para Term Preterm Abortions TAB SAB Ect Mult Living                 Review of Systems  Constitutional: Negative for fever and chills.  Cardiovascular: Positive for chest pain.  Gastrointestinal: Positive for nausea.  All other systems reviewed and are negative.    Allergies  Codeine and Sulfur  Home Medications   Current Outpatient Rx  Name  Route  Sig  Dispense  Refill  . Calcium Carbonate-Vitamin D (CALCIUM-VITAMIN D) 500-200 MG-UNIT per tablet   Oral   Take 1 tablet by mouth 2 (two) times daily with a meal.         . fish oil-omega-3 fatty acids 1000 MG capsule   Oral   Take 1 g by mouth 3 (three) times daily.         . Multiple Vitamin (MULTIVITAMIN WITH MINERALS) TABS   Oral   Take 1 tablet by mouth daily.         Marland Kitchen omeprazole (PRILOSEC) 20 MG capsule   Oral  Take 20 mg by mouth daily.         . polyethylene glycol (MIRALAX / GLYCOLAX) packet   Oral   Take 17 g by mouth as directed. 3 days on and 3 days off         . ursodiol (ACTIGALL) 500 MG tablet   Oral   Take 1 tablet (500 mg total) by mouth 2 (two) times daily.   60 tablet   11    BP 100/74  Pulse 68  Resp 19  SpO2 100% Physical Exam  Constitutional: She is oriented to person, place, and time. She appears well-developed and well-nourished. No distress.  HENT:  Head: Normocephalic and atraumatic.  Right Ear: External ear normal.  Left Ear: External ear normal.  Nose: Nose normal.  Mouth/Throat: Oropharynx is clear and moist. No  oropharyngeal exudate.  Eyes: Conjunctivae are normal.  Neck: Neck supple.  Cardiovascular: Normal rate, regular rhythm, normal heart sounds and intact distal pulses.   Pulmonary/Chest: Effort normal and breath sounds normal. She exhibits no tenderness.  Abdominal: Soft.  Musculoskeletal: Normal range of motion. She exhibits no edema.  Neurological: She is alert and oriented to person, place, and time.  Skin: Skin is warm and dry. No rash noted. She is not diaphoretic.    ED Course  Procedures (including critical care time) Medications  nitroGLYCERIN (NITROSTAT) SL tablet 0.4 mg (0.4 mg Sublingual Given 12/25/13 0036)  sodium chloride 0.9 % bolus 1,000 mL (1,000 mLs Intravenous New Bag/Given 12/25/13 0048)  acetaminophen (TYLENOL) tablet 650 mg (650 mg Oral Given 12/25/13 0035)  morphine 4 MG/ML injection 2 mg (2 mg Intravenous Given 12/25/13 0056)    Labs Review Labs Reviewed  BASIC METABOLIC PANEL - Abnormal; Notable for the following:    Glucose, Bld 106 (*)    All other components within normal limits  CBC  PRO B NATRIURETIC PEPTIDE  TROPONIN I  POCT I-STAT TROPONIN I   Imaging Review Dg Chest 2 View  12/24/2013   CLINICAL DATA:  Chest pain radiating to left breast.  EXAM: CHEST  2 VIEW  COMPARISON:  None available for comparison at time of study interpretation.  FINDINGS: Cardiomediastinal silhouette is unremarkable. Minimal chronic interstitial changes. The lungs are otherwise clear without pleural effusions or focal consolidations. Trachea projects midline and there is no pneumothorax. Soft tissue planes and included osseous structures are non-suspicious. Mild S-type levoscoliosis. Surgical clips in the right abdomen may reflect cholecystectomy.  IMPRESSION: No acute cardiopulmonary process.   Electronically Signed   By: Elon Alas   On: 12/24/2013 22:29    EKG Interpretation   None       MDM   1. Chest pain     Filed Vitals:   12/25/13 0030  BP: 100/74  Pulse:  68  Resp: 19   Afebrile, NAD, non-toxic appearing, AAOx4.  Concern for cardiac etiology of Chest Pain. [facility] has been consulted and will see patient in the ED for likely admit. Pt does not meet criteria for CP protocol and a further evaluation is recommended. Pt has been re-evaluated prior to consult and VSS, NAD, heart RRR, pain 0/10, lungs CTAB. No acute abnormalities found on EKG and first round of cardiac enzymes negative. This case was discussed with Dr. Curly Rim who has seen the patient and agrees with plan to admit.      Harlow Mares, PA-C 12/25/13 [phone]

## 2013-12-24 NOTE — ED Notes (Addendum)
Pt reports waking up with sharp, stabbing pain to center of chest and under L breast at 7:30am.  States pain resolved and that she has had 2 more episodes of pain but this time it is constant and with sob since 6pm.  Pt took Aspirin 325 mg and then vomited x 3.  Denies nausea at present.  Also reports headache.

## 2013-12-25 DIAGNOSIS — I10 Essential (primary) hypertension: Secondary | ICD-10-CM

## 2013-12-25 DIAGNOSIS — R079 Chest pain, unspecified: Secondary | ICD-10-CM

## 2013-12-25 DIAGNOSIS — R7309 Other abnormal glucose: Secondary | ICD-10-CM

## 2013-12-25 LAB — LIPID PANEL
CHOL/HDL RATIO: 3.4 ratio
Cholesterol: 175 mg/dL (ref 0–200)
HDL: 51 mg/dL (ref >39)
LDL CALC: 103 mg/dL — AB (ref 0–99)
Triglycerides: 107 mg/dL (ref <150)
VLDL: 21 mg/dL (ref 0–40)

## 2013-12-25 LAB — BASIC METABOLIC PANEL
BUN: 8 mg/dL (ref 6–23)
CO2: 23 mEq/L (ref 19–32)
Calcium: 8.8 mg/dL (ref 8.4–10.5)
Chloride: 105 mEq/L (ref 96–112)
Creatinine, Ser: 0.51 mg/dL (ref 0.50–1.10)
GFR calc Af Amer: >90 mL/min (ref >90)
Glucose, Bld: 83 mg/dL (ref 70–99)
Potassium: 3.9 mEq/L (ref 3.7–5.3)
SODIUM: 142 meq/L (ref 137–147)

## 2013-12-25 LAB — TROPONIN I: Troponin I: <0.3 ng/mL (ref <0.30)

## 2013-12-25 LAB — HEMOGLOBIN A1C
Hgb A1c MFr Bld: 6.2 % — ABNORMAL HIGH (ref <5.7)
MEAN PLASMA GLUCOSE: 131 mg/dL — AB (ref <117)

## 2013-12-25 MED ORDER — ACETAMINOPHEN 650 MG RE SUPP: 650.0000 mg | Freq: Four times a day (QID) | RECTAL | Status: DC | PRN

## 2013-12-25 MED ORDER — ACETAMINOPHEN 325 MG PO TABS
650.0000 mg | ORAL_TABLET | Freq: Once | ORAL | Status: AC
  Administered 2013-12-25: 650 mg via ORAL
  Filled 2013-12-25: qty 2

## 2013-12-25 MED ORDER — ATORVASTATIN CALCIUM 80 MG PO TABS
80.0000 mg | ORAL_TABLET | Freq: Every day | ORAL | Status: DC
  Filled 2013-12-25: qty 1

## 2013-12-25 MED ORDER — ADULT MULTIVITAMIN W/MINERALS CH
1.0000 | ORAL_TABLET | Freq: Every day | ORAL | Status: DC
  Administered 2013-12-25: 1 via ORAL
  Filled 2013-12-25: qty 1

## 2013-12-25 MED ORDER — POLYETHYLENE GLYCOL 3350 17 G PO PACK
17.0000 g | PACK | ORAL | Status: DC
  Filled 2013-12-25: qty 1

## 2013-12-25 MED ORDER — OMEGA-3-ACID ETHYL ESTERS 1 G PO CAPS
1.0000 g | ORAL_CAPSULE | Freq: Three times a day (TID) | ORAL | Status: DC
  Administered 2013-12-25: 1 g via ORAL
  Filled 2013-12-25 (×3): qty 1

## 2013-12-25 MED ORDER — CALCIUM CARBONATE-VITAMIN D 500-200 MG-UNIT PO TABS
1.0000 | ORAL_TABLET | Freq: Two times a day (BID) | ORAL | Status: DC
  Administered 2013-12-25: 1 via ORAL
  Filled 2013-12-25 (×2): qty 1

## 2013-12-25 MED ORDER — SODIUM CHLORIDE 0.9 % IV SOLN
INTRAVENOUS | Status: AC
Start: 2013-12-25 — End: 2013-12-25
  Administered 2013-12-25: 04:00:00 via INTRAVENOUS

## 2013-12-25 MED ORDER — OMEGA-3 FATTY ACIDS 1000 MG PO CAPS: 1.0000 g | ORAL_CAPSULE | Freq: Three times a day (TID) | ORAL | Status: DC

## 2013-12-25 MED ORDER — URSODIOL 300 MG PO CAPS
600.0000 mg | ORAL_CAPSULE | Freq: Two times a day (BID) | ORAL | Status: DC
  Administered 2013-12-25: 600 mg via ORAL
  Filled 2013-12-25 (×2): qty 2

## 2013-12-25 MED ORDER — MORPHINE SULFATE 4 MG/ML IJ SOLN
2.0000 mg | Freq: Once | INTRAMUSCULAR | Status: AC
  Administered 2013-12-25: 2 mg via INTRAVENOUS
  Filled 2013-12-25: qty 1

## 2013-12-25 MED ORDER — PANTOPRAZOLE SODIUM 40 MG PO TBEC
40.0000 mg | DELAYED_RELEASE_TABLET | Freq: Every day | ORAL | Status: DC
Start: 2013-12-25 — End: 2013-12-25
  Administered 2013-12-25: 40 mg via ORAL

## 2013-12-25 MED ORDER — URSODIOL 500 MG PO TABS: 500.0000 mg | ORAL_TABLET | Freq: Two times a day (BID) | ORAL | Status: DC

## 2013-12-25 MED ORDER — CALCIUM-VITAMIN D 500-200 MG-UNIT PO TABS: 1.0000 | ORAL_TABLET | Freq: Two times a day (BID) | ORAL | Status: DC

## 2013-12-25 MED ORDER — ACETAMINOPHEN 325 MG PO TABS: 650.0000 mg | ORAL_TABLET | Freq: Four times a day (QID) | ORAL | Status: DC | PRN

## 2013-12-25 MED ORDER — ENOXAPARIN SODIUM 40 MG/0.4ML ~~LOC~~ SOLN
40.0000 mg | SUBCUTANEOUS | Status: DC
  Filled 2013-12-25: qty 0.4

## 2013-12-25 MED ORDER — ASPIRIN EC 81 MG PO TBEC
81.0000 mg | DELAYED_RELEASE_TABLET | Freq: Every day | ORAL | Status: DC
  Administered 2013-12-25: 81 mg via ORAL
  Filled 2013-12-25: qty 1

## 2013-12-25 NOTE — H&P (Signed)
  Date: 12/25/2013  Patient name: Teresa Keller  Medical record number: [id number]  Date of birth: 29-Dec-1940   I have seen and evaluated Teresa Keller and discussed their care with the Residency Team.   Assessment and Plan: I have seen and evaluated the patient as outlined above. I agree with the formulated Assessment and Plan as detailed in the residents' admission note, with the following changes:   73 year old with certainly risk factors for CAD but atypical chest pain. She ruled out for MI and her exercise treadmill test was negative. She will followup with [facility] and Cardiology as an outpatient. Pt without pain at present and no reproducible MSK pain. She supposes it may be due to GERD as she has had more problems with acid reflux after recent GI surgery.  Truman Hayward, [place] 2/9/20153:32 PM

## 2013-12-25 NOTE — Discharge Summary (Signed)
Name: Teresa Keller MRN: [id number] DOB: [birth year] 73 y.o. PCP: No Pcp Per Patient  Date of Admission: 12/24/2013 10:00 PM Date of Discharge: 12/25/2013 Attending Physician: Truman Hayward, MD  Discharge Diagnosis: Atypical Chest pain Prediabetes  Discharge Medications:   Medication List         calcium-vitamin D 500-200 MG-UNIT per tablet  Take 1 tablet by mouth 2 (two) times daily with a meal.     fish oil-omega-3 fatty acids 1000 MG capsule  Take 1 g by mouth 3 (three) times daily.     multivitamin with minerals Tabs tablet  Take 1 tablet by mouth daily.     omeprazole 20 MG capsule  Commonly known as:  PRILOSEC  Take 20 mg by mouth daily.     polyethylene glycol packet  Commonly known as:  MIRALAX / GLYCOLAX  Take 17 g by mouth as directed. 3 days on and 3 days off     ursodiol 500 MG tablet  Commonly known as:  ACTIGALL  Take 1 tablet (500 mg total) by mouth 2 (two) times daily.        Disposition and follow-up:   Ms.Heavin T Keller was discharged from [facility] in Stable condition.  At the hospital follow up visit please address:  1.  Patient needs to have PCP, was seen once in our clinic before but did not want to establish, reports Teresa does want to establish with the [facility] at this time.  Patient should be referred to cardiology for follow up. Please stress dietary/lifestyle changes for Prediabetes.  2.  Labs / imaging needed at time of follow-up: None  3.  Pending labs/ test needing follow-up: None  Follow-up Appointments:     Follow-up Information   Follow up with Ivin Poot, MD On 01/01/2014. (at 10:15am)    Specialty:  Internal Medicine   Contact information:   [place] [address] [phone]       Discharge Instructions: Discharge Orders   Future Appointments Provider Department Dept Phone   01/01/2014 10:15 AM Ivin Poot, MD [place] [phone]   Future  Orders Complete By Expires   Call MD for:  difficulty breathing, headache or visual disturbances  As directed    Call MD for:  extreme fatigue  As directed    Call MD for:  severe uncontrolled pain  As directed    Diet - low sodium heart healthy  As directed    Discharge instructions  As directed    Comments:     Please keep you follow up appointment. At that appointment you will need to decide if you want to establish with the clinic as your PCP or if you want to establish elsewhere.   Increase activity slowly  As directed       Consultations:    Procedures Performed:  Dg Chest 2 View  12/24/2013   CLINICAL DATA:  Chest pain radiating to left breast.  EXAM: CHEST  2 VIEW  COMPARISON:  None available for comparison at time of study interpretation.  FINDINGS: Cardiomediastinal silhouette is unremarkable. Minimal chronic interstitial changes. The lungs are otherwise clear without pleural effusions or focal consolidations. Trachea projects midline and there is no pneumothorax. Soft tissue planes and included osseous structures are non-suspicious. Mild S-type levoscoliosis. Surgical clips in the right abdomen may reflect cholecystectomy.  IMPRESSION: No acute cardiopulmonary process.   Electronically Signed   By: Elon Alas  On: 12/24/2013 22:29     Admission HPI: 89 y o F with PMH of HTN and PreDM, presented with c/o chest pain that initially started Sunday morning- yesterday morning, at about 9am-Teresa woke up with the pain, described as stabbing pain- in her mid sternal region radiating to her left chest, lasted about 88mins. Pain stopped and reoccured two more times. Last episode was arounds 6pm same day, while Teresa was sitting down at home doing nothing, rated the pain- 8/10,not radiating to the back, jaw or arm, not related to change in position, some increased pain with deep breathing but pt said Teresa had some relief with belching, also says when Teresa some times takes a deep breath Teresa feels  the pain, pain was not relieved by Nitroglycerin given in the ED. Mild SOB this evening, that resolved, no diaphoresis. Pt said Teresa took a lot of tums, and aspirin and subsequently had 3 episodes of vomiting. No family hx of premature coronary heart disease, no cough, no fever, no leg swelling,redness or pain, pt is very active, no recent surgeries requiring immobilization, no recent long trips.    Hospital Course by problem list:    Atypical Chest pain  Patient presented with atypical chest pain, given her age, HTN, pre-DM Teresa was considered at intermediate coronary risk.  He was admitted overnight, given ASA and cardiac enzymes were trended overnight which were negative.  AM EKG remained uncharged.  An exercise stress test was obtained which showed ~68mm inferolateral J point depression and upsloping ST depression in leads II, III, aVF, V4-V6.  Patient was discussed with Cardiology-Dr. Mare Ferrari who reported he would not do a cardiac cath at this time and Teresa should follow up with her primary care physician.   Impaired Fasting Glucose/Prediabetes Patinent with impaired fasting glucose A1c 6.2.  Discharge Vitals:   BP 103/58  Pulse 56  Temp(Src) 97.9 F (36.6 C) (Oral)  Resp 15  Ht 5\' 2"  (1.575 m)  Wt 145 lb (65.772 kg)  BMI 26.51 kg/m2  SpO2 96%  Discharge Labs:  Results for orders placed during the hospital encounter of 12/24/13 (from the past 24 hour(s))  CBC     Status: None   Collection Time    12/24/13 10:06 PM      Result Value Range   WBC 8.1  4.0 - 10.5 K/uL   RBC 4.59  3.87 - 5.11 MIL/uL   Hemoglobin 13.3  12.0 - 15.0 g/dL   HCT 38.7  36.0 - 46.0 %   MCV 84.3  78.0 - 100.0 fL   MCH 29.0  26.0 - 34.0 pg   MCHC 34.4  30.0 - 36.0 g/dL   RDW 15.4  11.5 - 15.5 %   Platelets 283  150 - 400 K/uL  BASIC METABOLIC PANEL     Status: Abnormal   Collection Time    12/24/13 10:06 PM      Result Value Range   Sodium 142  137 - 147 mEq/L   Potassium 3.9  3.7 - 5.3 mEq/L    Chloride 102  96 - 112 mEq/L   CO2 25  19 - 32 mEq/L   Glucose, Bld 106 (*) 70 - 99 mg/dL   BUN 8  6 - 23 mg/dL   Creatinine, Ser 0.52  0.50 - 1.10 mg/dL   Calcium 10.0  8.4 - 10.5 mg/dL   GFR calc non Af Amer >90  >90 mL/min   GFR calc Af Amer >90  >90 mL/min  PRO B NATRIURETIC PEPTIDE     Status: None   Collection Time    12/24/13 10:07 PM      Result Value Range   Pro B Natriuretic peptide (BNP) 34.3  0 - 125 pg/mL  TROPONIN I     Status: None   Collection Time    12/24/13 10:07 PM      Result Value Range   Troponin I <0.30  <0.30 ng/mL  POCT I-STAT TROPONIN I     Status: None   Collection Time    12/24/13 10:20 PM      Result Value Range   Troponin i, poc 0.00  0.00 - 0.08 ng/mL   Comment 3           TROPONIN I     Status: None   Collection Time    12/25/13  9:15 AM      Result Value Range   Troponin I <0.30  <0.30 ng/mL  LIPID PANEL     Status: Abnormal   Collection Time    12/25/13  9:15 AM      Result Value Range   Cholesterol 175  0 - 200 mg/dL   Triglycerides 107  <150 mg/dL   HDL 51  >39 mg/dL   Total CHOL/HDL Ratio 3.4     VLDL 21  0 - 40 mg/dL   LDL Cholesterol 103 (*) 0 - 99 mg/dL  BASIC METABOLIC PANEL     Status: None   Collection Time    12/25/13  9:15 AM      Result Value Range   Sodium 142  137 - 147 mEq/L   Potassium 3.9  3.7 - 5.3 mEq/L   Chloride 105  96 - 112 mEq/L   CO2 23  19 - 32 mEq/L   Glucose, Bld 83  70 - 99 mg/dL   BUN 8  6 - 23 mg/dL   Creatinine, Ser 0.51  0.50 - 1.10 mg/dL   Calcium 8.8  8.4 - 10.5 mg/dL   GFR calc non Af Amer >90  >90 mL/min   GFR calc Af Amer >90  >90 mL/min    Signed: Joni Reining, DO 12/25/2013, 2:03 PM   Time Spent on Discharge: 20 minutes Services Ordered on Discharge: None Equipment Ordered on Discharge: None

## 2013-12-25 NOTE — Progress Notes (Signed)
UR completed 

## 2013-12-25 NOTE — Progress Notes (Signed)
Pt provided with dc instructions and education. Pt verbalized understanding. Pt has no questions at this time. IV removed with tip intact. Hear tmonitor cleaned and returned to front. Lindsea Olivar, RN 

## 2013-12-25 NOTE — Progress Notes (Addendum)
Chest pain rounds: Chart reviewed. Patient examined. EKG normal. Agree with plans for Bruce protocol treadmill stress test. Patient states she goes to gym and will be able to walk okay on treadmill.  Dr. Mare Ferrari  Addendum:  Exercise Treadmill Test:  Pre: no chest pain or sob. VSS. ECG w/o acute st/t changes.  During: Ex time - 4:00, max HR 153, BP rose to 218/91, no chest pain. ECG with ~ 11mm inferolateral J point depression and upsloping ST depression in leads II, III, aVF, V4-V6.  Test ended due to target achieved/hypertensive response.  Post: No chest pain or sob.  BP remains elevated @ 187/78.  ECG w/o acute ST/T changes.  Murray Hodgkins, NP 12/25/2013 11:18 AM

## 2013-12-25 NOTE — Discharge Instructions (Signed)
Chest Pain Observation °It is often hard to give a specific diagnosis for the cause of chest pain. Among other possibilities your symptoms might be caused by inadequate oxygen delivery to your heart (angina). Angina that is not treated or evaluated can lead to a heart attack (myocardial infarction) or death. °Blood tests, electrocardiograms, and X-rays may have been done to help determine a possible cause of your chest pain. After evaluation and observation, your health care provider has determined that it is unlikely your pain was caused by an unstable condition that requires hospitalization. However, a full evaluation of your pain may need to be completed, with additional diagnostic testing as directed. It is very important to keep your follow-up appointments. Not keeping your follow-up appointments could result in permanent heart damage, disability, or death. If there is any problem keeping your follow-up appointments, you must call your health care provider. °HOME CARE INSTRUCTIONS  °Due to the slight chance that your pain could be angina, it is important to follow your health care provider's treatment plan and also maintain a healthy lifestyle: °· Maintain or work toward achieving a healthy weight. °· Stay physically active and exercise regularly. °· Decrease your salt intake. °· Eat a balanced, healthy diet. Talk to a dietician to learn about heart healthy foods. °· Increase your fiber intake by including whole grains, vegetables, fruits, and nuts in your diet. °· Avoid situations that cause stress, anger, or depression. °· Take medicines as advised by your health care provider. Report any side effects to your health care provider. Do not stop medicines or adjust the dosages on your own. °· Quit smoking. Do not use nicotine patches or gum until you check with your health care provider. °· Keep your blood pressure, blood sugar, and cholesterol levels within normal limits. °· Limit alcohol intake to no more than  1 drink per day for women that are not pregnant and 2 drinks per day for men. °· Do not abuse drugs. °SEEK IMMEDIATE MEDICAL CARE IF: °You have severe chest pain or pressure which may include symptoms such as: °· You feel pain or pressure in you arms, neck, jaw, or back. °· You have severe back or abdominal pain, feel sick to your stomach (nauseous), or throw up (vomit). °· You are sweating profusely. °· You are having a fast or irregular heartbeat. °· You feel short of breath while at rest. °· You notice increasing shortness of breath during rest, sleep, or with activity. °· You have chest pain that does not get better after rest or after taking your usual medicine. °· You wake from sleep with chest pain. °· You are unable to sleep because you cannot breathe. °· You develop a frequent cough or you are coughing up blood. °· You feel dizzy, faint, or experience extreme fatigue. °· You develop severe weakness, dizziness, fainting, or chills. °Any of these symptoms may represent a serious problem that is an emergency. Do not wait to see if the symptoms will go away. Call your local emergency services (911 in the [place] Do not drive yourself to the hospital. °MAKE SURE YOU: °· Understand these instructions. °· Will watch your condition. °· Will get help right away if you are not doing well or get worse. °Document Released: 12/05/2010 Document Revised: 07/05/2013 Document Reviewed: 05/04/2013 °[employer]® Patient Information ©2014 [employer]. ° °

## 2013-12-25 NOTE — Progress Notes (Signed)
Subjective: Denies any further episodes of chest pain, reports she is feeling very well, would like to go home as soon as possible. Objective: Vital signs in last 24 hours: Filed Vitals:   12/24/13 2300 12/25/13 0030 12/25/13 0316 12/25/13 0518  BP: 117/63 100/74 117/50 103/58  Pulse: 68 68 66 56  Temp:   97.7 F (36.5 C) 97.9 F (36.6 C)  TempSrc:   Oral   Resp: '14 19 15 15  ' Height:   '5\' 2"'  (1.575 m)   Weight:   145 lb (65.772 kg)   SpO2: 100% 100% 99% 96%   Weight change:  No intake or output data in the 24 hours ending 12/25/13 0850 General: resting in bed HEENT: EOMI, no scleral icterus Cardiac: RRR, no rubs, murmurs or gallops Pulm: clear to auscultation bilaterally, moving normal volumes of air Abd: soft, nontender, nondistended, BS present Ext: warm and well perfused, no pedal edema Neuro: alert and oriented X4  Lab Results: Basic Metabolic Panel:  Recent Labs Lab 12/24/13 2206  NA 142  K 3.9  CL 102  CO2 25  GLUCOSE 106*  BUN 8  CREATININE 0.52  CALCIUM 10.0   CBC:  Recent Labs Lab 12/24/13 2206  WBC 8.1  HGB 13.3  HCT 38.7  MCV 84.3  PLT 283   Cardiac Enzymes:  Recent Labs Lab 12/24/13 2207  TROPONINI <0.30   BNP:  Recent Labs Lab 12/24/13 2207  PROBNP 34.3    Micro Results: No results found for this or any previous visit (from the past 240 hour(s)). Studies/Results: Dg Chest 2 View  12/24/2013   CLINICAL DATA:  Chest pain radiating to left breast.  EXAM: CHEST  2 VIEW  COMPARISON:  None available for comparison at time of study interpretation.  FINDINGS: Cardiomediastinal silhouette is unremarkable. Minimal chronic interstitial changes. The lungs are otherwise clear without pleural effusions or focal consolidations. Trachea projects midline and there is no pneumothorax. Soft tissue planes and included osseous structures are non-suspicious. Mild S-type levoscoliosis. Surgical clips in the right abdomen may reflect cholecystectomy.   IMPRESSION: No acute cardiopulmonary process.   Electronically Signed   By: Elon Alas   On: 12/24/2013 22:29   Medications: I have reviewed the patient's current medications. Scheduled Meds: . aspirin EC  81 mg Oral Daily  . atorvastatin  80 mg Oral Daily  . calcium-vitamin D  1 tablet Oral BID  . enoxaparin (LOVENOX) injection  40 mg Subcutaneous Q24H  . multivitamin with minerals  1 tablet Oral Daily  . omega-3 acid ethyl esters  1 g Oral TID  . pantoprazole  40 mg Oral Daily  . polyethylene glycol  17 g Oral Custom  . ursodiol  600 mg Oral BID   Continuous Infusions: . sodium chloride 75 mL/hr at 12/25/13 0340   PRN Meds:.acetaminophen, acetaminophen Assessment/Plan:    Atypical Chest pain - ACS ruled out with negative cardiac enzymes, no acute EKG changes.  Given her age, HTN, pre-DM plan for ETT today to further risk stratify.  Spoke with Dr. Mare Ferrari, agrees with ETT. - Update 1:42pm- ETT met goals, patient did have ~65m inferolateral J point depression and upsloping ST depression in leads II, III, aVF, V4-V6.  Spoke again with Dr. BMare Ferrari does not want LHC at this time. Feels patient is stable from cardiac standpoint for discharge. Recommends follow up with PCP, can discuss further testing/ cardiology referral at that time. - As patients pain has resolved she is stable for discharge. She  was previously been seen at the [facility], she did not want to establish as a patient at that time.  She is agreeable to follow up with [facility].  At that visit she can be encourage to establish with a PCP. -Further workup as outpatient, patient encouraged to take her omeprazole daily.   Dispo: Discharge home.  The patient does not have a current PCP (No Pcp Per Patient) and does need an [facility] hospital follow-up appointment after discharge.  The patient does not have transportation limitations that hinder transportation to clinic appointments.  .Services Needed at time of discharge: Y = Yes,  Blank = No PT:   OT:   RN:   Equipment:   Other:     LOS: 1 day   Joni Reining, DO 12/25/2013, 8:50 AM

## 2013-12-25 NOTE — ED Notes (Signed)
Internal medicine MD at bedside

## 2013-12-25 NOTE — ED Provider Notes (Signed)
Medical screening examination/treatment/procedure(s) were conducted as a shared visit with non-physician practitioner(s) and myself.  I personally evaluated the patient during the encounter.    CP.  Pt with risk factors.  Admit for further evaluation.    Elmer Sow, MD 12/25/13 1329

## 2013-12-25 NOTE — Plan of Care (Signed)
Problem: Phase I Progression Outcomes Goal: Aspirin unless contraindicated Outcome: Completed/Met Date Met:  12/25/13 Took pta

## 2013-12-25 NOTE — H&P (Signed)
Date: 12/25/2013               Patient Name:  Teresa Keller MRN: [id number]  DOB: [birth year] Age / Sex: 73 y.o., female   PCP: No Pcp Per Patient         Medical Service: Internal Medicine Teaching Service         Attending Physician: Dr. Alcide Evener    First Contact: Dr. Heber Haynes Pager: [phone]  Second Contact: Dr. Alice Rieger Pager: [phone]       After Hours (After 5p/  First Contact Pager: [phone]  weekends / holidays): Second Contact Pager: [phone]   Chief Complaint: Chest Pain  History of Present Illness: 37 y o F with PMH of HTN and PreDM, presented with c/o chest pain that initially started Sunday morning- yesterday morning, at about 9am-she woke up with the pain, described as stabbing pain- in her mid sternal region radiating to her left chest, lasted about 62mins. Pain stopped and reoccured two more times. Last episode was arounds 6pm same day, while she was sitting down at home doing nothing, rated the pain- 8/10,not radiating to the back, jaw or arm, not related to change in position, some increased pain with deep breathing but pt said she had some relief with belching, also says when she some times takes a deep breath she feels the pain, pain was not relieved by Nitroglycerin given in the ED. Mild SOB this evening, that resolved, no diaphoresis. Pt said she took a lot of tums, and aspirin and subsequently had 3 episodes of vomiting. No family hx of premature coronary heart disease, no cough, no fever, no leg swelling,redness or pain, pt is very active, no recent surgeries requiring immobilization, no recent long trips.   Meds: Current Facility-Administered Medications  Medication Dose Route Frequency Provider Last Rate Last Dose  . nitroGLYCERIN (NITROSTAT) SL tablet 0.4 mg  0.4 mg Sublingual Q5 Min x 3 PRN Anderson Malta L Piepenbrink, PA-C   0.4 mg at 12/25/13 0036   Current Outpatient Prescriptions  Medication Sig Dispense Refill  . Calcium Carbonate-Vitamin D  (CALCIUM-VITAMIN D) 500-200 MG-UNIT per tablet Take 1 tablet by mouth 2 (two) times daily with a meal.      . fish oil-omega-3 fatty acids 1000 MG capsule Take 1 g by mouth 3 (three) times daily.      . Multiple Vitamin (MULTIVITAMIN WITH MINERALS) TABS Take 1 tablet by mouth daily.      Marland Kitchen omeprazole (PRILOSEC) 20 MG capsule Take 20 mg by mouth daily.      . polyethylene glycol (MIRALAX / GLYCOLAX) packet Take 17 g by mouth as directed. 3 days on and 3 days off      . ursodiol (ACTIGALL) 500 MG tablet Take 1 tablet (500 mg total) by mouth 2 (two) times daily.  60 tablet  11    Allergies: Allergies as of 12/24/2013 - Review Complete 12/24/2013  Allergen Reaction Noted  . Codeine    . Sulfur  07/27/2013   Past Medical History  Diagnosis Date  . Arthritis   . Uncontrolled hypertension, stage 1 08/26/2012   Past Surgical History  Procedure Laterality Date  . Tubal ligation    . Cholecystectomy  october 2012  . Ercp  08/22/2012    Procedure: ENDOSCOPIC RETROGRADE CHOLANGIOPANCREATOGRAPHY (ERCP);  Surgeon: Inda Castle, MD;  Location: [place];  Service: Endoscopy;  Laterality: N/A;  . Eus  11/21/2012    Procedure: UPPER ENDOSCOPIC ULTRASOUND (EUS) LINEAR;  Surgeon:  Milus Banister, MD;  Location: [facility];  Service: Endoscopy;  Laterality: N/A;  . Esophagogastroduodenoscopy  04/25/13    Surveillance at [facility] - Normal appearing ampulla; No residual adenomatous tissue seen  . Ventral hernia repair  08/2011    s/p chole, at trochar site, by Dr. Lucia Gaskins  . Ercp N/A 07/31/2013    Procedure: ENDOSCOPIC RETROGRADE CHOLANGIOPANCREATOGRAPHY (ERCP);  Surgeon: Irene Shipper, MD;  Location: [facility] ENDOSCOPY;  Service: Endoscopy;  Laterality: N/A;   Family History  Problem Relation Age of Onset  . Hypertension Mother   . Hypertension Sister   . Colon cancer Mother    History   Social History  . Marital Status: Widowed    Spouse Name: N/A    Number of Children: N/A  . Years of Education:  N/A   Occupational History  . Not on file.   Social History Main Topics  . Smoking status: Never Smoker   . Smokeless tobacco: Never Used  . Alcohol Use: No  . Drug Use: No  . Sexual Activity: No   Other Topics Concern  . Not on file   Social History Narrative  . No narrative on file    Review of Systems: CONSTITUTIONAL- No Fever, weightloss, night sweat, or change in appetite. SKIN- No Rash, colour changes,or itching. HEAD- Occasional; headaches, present today, no dizziness. EYES- No changes in vision.. Mouth/throat- No Sorethroat, wears dentures, no bleeding gums. RESPIRATORY- No Cough, or SOB. GI- 3 episodes of  vomiting, No diarrhoea, constipation, or abd pain. URINARY- No dysuria NEUROLOGIC- No syncope or numbness.   Physical Exam: Blood pressure 100/74, pulse 68, resp. rate 19, SpO2 100.00%. GENERAL- Pleasant, alert, co-operative, appears as stated age, not in any distress. HEENT- Atraumatic, normocephalic, PERRL, EOMI, oral mucosa appears moist, no cervical LN enlargement, thyroid does not appear enlarged. CARDIAC- RRR, no murmurs, rubs or gallops, chest pain not reproducible on palpation. RESP- Moving equal volumes of air, and clear to auscultation bilaterally, no wheezes or crackles. ABDOMEN- Soft, non tender, no palpable masses or organomegaly, bowel sounds present. BACK- Normal curvature of the spine, No tenderness along the vertebrae, no CVA tenderness. NEURO- No obvious Cr N deficit, strenght equal-5/5 and present in all extremities. EXTREMITIES- pulse 2+, symmetric, no pedal edema. SKIN- Warm, dry, No rash or lesion. PSYCH- Normal mood and affect, appropriate thought content and speech.  Lab results: Basic Metabolic Panel:  Recent Labs  12/24/13 2206  NA 142  K 3.9  CL 102  CO2 25  GLUCOSE 106*  BUN 8  CREATININE 0.52  CALCIUM 10.0   Liver Function Tests: No results found for this basename: AST, ALT, ALKPHOS, BILITOT, PROT, ALBUMIN,  in the  last 72 hours No results found for this basename: LIPASE, AMYLASE,  in the last 72 hours No results found for this basename: AMMONIA,  in the last 72 hours CBC:  Recent Labs  12/24/13 2206  WBC 8.1  HGB 13.3  HCT 38.7  MCV 84.3  PLT 283   Cardiac Enzymes:  Recent Labs  12/24/13 2207  TROPONINI <0.30   BNP:  Recent Labs  12/24/13 2207  PROBNP 34.3   D-Dimer: No results found for this basename: DDIMER,  in the last 72 hours CBG: No results found for this basename: GLUCAP,  in the last 72 hours Hemoglobin A1C: No results found for this basename: HGBA1C,  in the last 72 hours Fasting Lipid Panel: No results found for this basename: CHOL, HDL, LDLCALC, TRIG, CHOLHDL, LDLDIRECT,  in the last 72 hours Thyroid Function Tests: No results found for this basename: TSH, T4TOTAL, FREET4, T3FREE, THYROIDAB,  in the last 72 hours Anemia Panel: No results found for this basename: VITAMINB12, FOLATE, FERRITIN, TIBC, IRON, RETICCTPCT,  in the last 72 hours Coagulation: No results found for this basename: LABPROT, INR,  in the last 72 hours Urine Drug Screen: Drugs of Abuse  No results found for this basename: labopia,  cocainscrnur,  labbenz,  amphetmu,  thcu,  labbarb    Alcohol Level: No results found for this basename: ETH,  in the last 72 hours Urinalysis: No results found for this basename: COLORURINE, APPERANCEUR, LABSPEC, PHURINE, GLUCOSEU, HGBUR, BILIRUBINUR, KETONESUR, PROTEINUR, UROBILINOGEN, NITRITE, LEUKOCYTESUR,  in the last 72 hours Misc. Labs:   Imaging results:  Dg Chest 2 View  12/24/2013   CLINICAL DATA:  Chest pain radiating to left breast.  EXAM: CHEST  2 VIEW  COMPARISON:  None available for comparison at time of study interpretation.  FINDINGS: Cardiomediastinal silhouette is unremarkable. Minimal chronic interstitial changes. The lungs are otherwise clear without pleural effusions or focal consolidations. Trachea projects midline and there is no  pneumothorax. Soft tissue planes and included osseous structures are non-suspicious. Mild S-type levoscoliosis. Surgical clips in the right abdomen may reflect cholecystectomy.  IMPRESSION: No acute cardiopulmonary process.   Electronically Signed   By: Elon Alas   On: 12/24/2013 22:29    Other results: EKG: Rate- 81, not ST or T wave abnormalities, unchanged form prior.  Assessment & Plan by Problem:  Chest Pain- Chest pain not typical of typical anginal pain or MI, ACS is of concern- Risk factors- Age, HTN, pre-Dm. TIMI score- 1, 5% risk of mortality in 14 days. Will admit and rule out, because of pt age. I-stat trop, Trops X1- Negative, EKG- not suggestive of Ischemia or MI. Possible pleuritis- As patient said pain sometimes aggrav by deep breathing, but CBC revealed no leukocytosis, and chest xray not suggestive of infection. Other Possible etiology- GERD- as pain was initially substernal, described as sharp/burning, pt is on home omperoazole- 20mg  daily.  - Aspirin- 81mg  daily - Tylenol for pain - NPO - In patient stress testing- pt can do an exercise treadmill test. - Lipid profile - HBA1c  HTN- Blood pressure appears stable. Not on any blood pressure lowering meds. - Cont to monitor. - Bmet in the am, as pt has a mild gap- 15, if still persistent consider salicyclate level, Lactic acid.   PreDM- As per chat review. Not on any meds. - HBa1C in the am.  DVT ppx- Enoxaparin.  Dispo: Disposition is deferred at this time, awaiting improvement of current medical problems. Anticipated discharge in approximately 1-2 day(s).   The patient does not know have a current PCP (No Pcp Per Patient) and does not know need an [facility] hospital follow-up appointment after discharge.  The patient does not know have transportation limitations that hinder transportation to clinic appointments.  Signed: Jenetta Downer, MD 12/25/2013, 2:35 AM

## 2013-12-29 NOTE — Discharge Summary (Signed)
  Date: 12/29/2013  Patient name: Teresa Keller  Medical record number: [id number]  Date of birth: [birth year]   This patient has been seen and the plan of care was discussed with the house staff. Please see their note for complete details. I concur with their findings with the following additions/corrections:  Please see my note from date of discharge for details. Her transient "paralysis" in her face was not witnessed and ? Significance. Stress test negative.   Truman Hayward, MD 12/29/2013, 10:03 PM

## 2014-01-01 ENCOUNTER — Encounter: Payer: Self-pay | Admitting: Internal Medicine

## 2014-01-01 ENCOUNTER — Ambulatory Visit (HOSPITAL_COMMUNITY)
Admission: RE | Admit: 2014-01-01 | Discharge: 2014-01-01 | Disposition: A | Discharge status: Home / Self Care | Payer: BC Managed Care – PPO | Source: Ambulatory Visit | Attending: Internal Medicine | Admitting: Internal Medicine

## 2014-01-01 ENCOUNTER — Ambulatory Visit (INDEPENDENT_AMBULATORY_CARE_PROVIDER_SITE_OTHER): Payer: BC Managed Care – PPO | Admitting: Internal Medicine

## 2014-01-01 VITALS — BP 122/77 | HR 80 | Temp 97.8°F | Ht 62.0 in | Wt 150.1 lb

## 2014-01-01 DIAGNOSIS — K8050 Calculus of bile duct without cholangitis or cholecystitis without obstruction: Secondary | ICD-10-CM

## 2014-01-01 DIAGNOSIS — Z1231 Encounter for screening mammogram for malignant neoplasm of breast: Secondary | ICD-10-CM

## 2014-01-01 DIAGNOSIS — R079 Chest pain, unspecified: Secondary | ICD-10-CM

## 2014-01-01 DIAGNOSIS — R7309 Other abnormal glucose: Secondary | ICD-10-CM

## 2014-01-01 DIAGNOSIS — I10 Essential (primary) hypertension: Secondary | ICD-10-CM

## 2014-01-01 DIAGNOSIS — R7303 Prediabetes: Secondary | ICD-10-CM

## 2014-01-01 DIAGNOSIS — Z299 Encounter for prophylactic measures, unspecified: Secondary | ICD-10-CM

## 2014-01-01 NOTE — Assessment & Plan Note (Signed)
Normotensive today, 122/77, on no antihypertensives.

## 2014-01-01 NOTE — Assessment & Plan Note (Addendum)
Resolved, no recurrent chest pain, palpitations, shortness of breath.  Repeat EKG unchanged from prior.  Patient states "there is nothing wrong with my heart" but willing to see cardiology.  -Referral to cardiology

## 2014-01-01 NOTE — Patient Instructions (Signed)
Welcome to our clinic!  Please follow-up in 4-6 months.   Keep up the good work taking your medicines!  We are referring you for mammogram as well as to cardiology today.  They will call you with the appointment times.   We will request records from Dr. Ulyses Amor office about your last colonoscopy.   Calorie Counting Diet A calorie counting diet requires you to eat the number of calories that are right for you in a day. Calories are the measurement of how much energy you get from the food you eat. Eating the right amount of calories is important for staying at a healthy weight. If you eat too many calories, your body will store them as fat and you may gain weight. If you eat too few calories, you may lose weight. Counting the number of calories you eat during a day will help you know if you are eating the right amount. A Registered Dietitian can determine how many calories you need in a day. The amount of calories needed varies from person to person. If your goal is to lose weight, you will need to eat fewer calories. Losing weight can benefit you if you are overweight or have health problems such as heart disease, high blood pressure, or diabetes. If your goal is to gain weight, you will need to eat more calories. Gaining weight may be necessary if you have a certain health problem that causes your body to need more energy. TIPS Whether you are increasing or decreasing the number of calories you eat during a day, it may be hard to get used to changes in what you eat and drink. The following are tips to help you keep track of the number of calories you eat.  Measure foods at home with measuring cups. This helps you know the amount of food and number of calories you are eating.  Restaurants often serve food in amounts that are larger than 1 serving. While eating out, estimate how many servings of a food you are given. For example, a serving of cooked rice is  cup or about the size of half of a fist.  Knowing serving sizes will help you be aware of how much food you are eating at restaurants.  Ask for smaller portion sizes or child-size portions at restaurants.  Plan to eat half of a meal at a restaurant. Take the rest home or share the other half with a friend.  Read the Nutrition Facts panel on food labels for calorie content and serving size. You can find out how many servings are in a package, the size of a serving, and the number of calories each serving has.  For example, a package might contain 3 cookies. The Nutrition Facts panel on that package says that 1 serving is 1 cookie. Below that, it will say there are 3 servings in the container. The calories section of the Nutrition Facts label says there are 90 calories. This means there are 90 calories in 1 cookie (1 serving). If you eat 1 cookie you have eaten 90 calories. If you eat all 3 cookies, you have eaten 270 calories (3 servings x 90 calories = 270 calories). The list below tells you how big or small some common portion sizes are.  1 oz.........4 stacked dice.  3 oz........Marland KitchenDeck of cards.  1 tsp.......Marland KitchenTip of little finger.  1 tbs......Marland KitchenMarland KitchenThumb.  2 tbs.......Marland KitchenGolf ball.   cup......Marland KitchenHalf of a fist.  1 cup.......Marland KitchenA fist. KEEP A FOOD LOG Write down every  food item you eat, the amount you eat, and the number of calories in each food you eat during the day. At the end of the day, you can add up the total number of calories you have eaten. It may help to keep a list like the one below. Find out the calorie information by reading the Nutrition Facts panel on food labels. Breakfast  Bran cereal (1 cup, 110 calories).  Fat-free milk ( cup, 45 calories). Snack  Apple (1 medium, 80 calories). Lunch  Spinach (1 cup, 20 calories).  Tomato ( medium, 20 calories).  Chicken breast strips (3 oz, 165 calories).  Shredded cheddar cheese ( cup, 110 calories).  Light [place] dressing (2 tbs, 60 calories).  Whole-wheat  bread (1 slice, 80 calories).  Tub margarine (1 tsp, 35 calories).  Vegetable soup (1 cup, 160 calories). Dinner  Pork chop (3 oz, 190 calories).  Brown rice (1 cup, 215 calories).  Steamed broccoli ( cup, 20 calories).  Strawberries (1  cup, 65 calories).  Whipped cream (1 tbs, 50 calories). Daily Calorie Total: 0165 Document Released: 11/02/2005 Document Revised: 01/25/2012 Document Reviewed: 04/29/2007 [facility] Patient Information 2014 [place].

## 2014-01-01 NOTE — Assessment & Plan Note (Addendum)
Mammogram per above.   Patient states she has had two colonoscopies with Dr. Benson Norway in past.  Signed record release today.

## 2014-01-01 NOTE — Assessment & Plan Note (Addendum)
A1C 6.2% on 12/25/13.  Patient is very active (walks on treadmill 1 hour per day as well as lifts weights).  Provided information on calorie counting diet in AVS (BMI 27.5).

## 2014-01-01 NOTE — Assessment & Plan Note (Signed)
Stable, patient follows with Dr. Ardis Hughs at [facility].  Denies abdominal pain, nausea/vomiting; no change in bowel habits.

## 2014-01-01 NOTE — Progress Notes (Signed)
Patient ID: ELKE HOLTRY, female   DOB: 1940/11/21, 73 y.o.   MRN: [id number]   Subjective:   Patient ID: AICIA BABINSKI female   DOB: Oct 05, 1941 73 y.o.   MRN: [id number]  HPI: Ms.Nerissa T Vath is a 73 y.o. woman with history of mild HTN, prediabetes (A1C 6.2%), recently admitted for atypical chest pain who presents for hospital follow-up.   Patient states she is doing quite well, no complaints.  No recurrent chest pain, palpitations, shortness of breath.  She now attributes the chest pain she had to drinking a large amount of buttermilk the morning of admission.   Patient is quite active at home.  States that she walks on the treadmill for an hour every day as well as lifts weights.    She would like to have mammogram scheduled.  She states she has had colonoscopies with Dr. Benson Norway in past and is willing to sign release for those records.  She is also amenable to cardiology referral stating "there is nothing wrong with my heart, but I'll go."  She does not take daily ASA.     Past Medical History  Diagnosis Date  . Arthritis   . Uncontrolled hypertension, stage 1 08/26/2012   Current Outpatient Prescriptions  Medication Sig Dispense Refill  . Calcium Carbonate-Vitamin D (CALCIUM-VITAMIN D) 500-200 MG-UNIT per tablet Take 1 tablet by mouth 2 (two) times daily with a meal.      . fish oil-omega-3 fatty acids 1000 MG capsule Take 1 g by mouth 3 (three) times daily.      . Multiple Vitamin (MULTIVITAMIN WITH MINERALS) TABS Take 1 tablet by mouth daily.      Marland Kitchen omeprazole (PRILOSEC) 20 MG capsule Take 20 mg by mouth daily.      . polyethylene glycol (MIRALAX / GLYCOLAX) packet Take 17 g by mouth as directed. 3 days on and 3 days off      . ursodiol (ACTIGALL) 500 MG tablet Take 1 tablet (500 mg total) by mouth 2 (two) times daily.  60 tablet  11   No current facility-administered medications for this visit.   Family History  Problem Relation Age of Onset  . Hypertension Mother   .  Hypertension Sister   . Colon cancer Mother    History   Social History  . Marital Status: Widowed    Spouse Name: N/A    Number of Children: N/A  . Years of Education: N/A   Social History Main Topics  . Smoking status: Never Smoker   . Smokeless tobacco: Never Used  . Alcohol Use: No  . Drug Use: No  . Sexual Activity: No   Other Topics Concern  . None   Social History Narrative  . None   Review of Systems: Review of Systems  Constitutional: Negative for fever.  Eyes: Negative for blurred vision.  Respiratory: Negative for cough and shortness of breath.   Cardiovascular: Negative for chest pain, palpitations and leg swelling.  Gastrointestinal: Negative for nausea, vomiting, abdominal pain, diarrhea and constipation.  Genitourinary: Negative for dysuria.  Musculoskeletal: Negative for falls and myalgias.  Neurological: Negative for dizziness, loss of consciousness, weakness and headaches.    Objective:  Physical Exam: Filed Vitals:   01/01/14 1027  BP: 122/77  Pulse: 80  Temp: 97.8 F (36.6 C)  TempSrc: Oral  Height: 5\' 2"  (1.575 m)  Weight: 150 lb 1.6 oz (68.085 kg)  SpO2: 99%   General: alert, cooperative, and in no apparent distress HEENT:  NCAT, vision grossly intact, oropharynx clear and non-erythematous  Neck: supple, no lymphadenopathy Lungs: clear to ascultation bilaterally, normal work of respiration, no wheezes, rales, ronchi Heart: regular rate and rhythm, no murmurs, gallops, or rubs Abdomen: soft, non-tender, non-distended, normal bowel sounds Extremities: 2+ DP/PT pulses bilaterally, no cyanosis, clubbing, or edema Neurologic: alert & oriented X3, cranial nerves II-XII intact, strength grossly intact, sensation intact to light touch  Assessment & Plan:  Patient discussed with Dr. Ellwood Dense.  Please see problem-based assessment and plan.

## 2014-01-01 NOTE — Assessment & Plan Note (Addendum)
Mammogram scheduled today (2/24 at 8am at [facility]).

## 2014-01-05 NOTE — Progress Notes (Signed)
Case discussed with Dr. Rogers at the time of the visit.  We reviewed the resident's history and exam and pertinent patient test results.  I agree with the assessment, diagnosis, and plan of care documented in the resident's note. 

## 2014-01-09 ENCOUNTER — Ambulatory Visit (HOSPITAL_COMMUNITY): Payer: BC Managed Care – PPO | Attending: Internal Medicine

## 2014-01-22 ENCOUNTER — Ambulatory Visit (HOSPITAL_COMMUNITY)
Admission: RE | Admit: 2014-01-22 | Discharge: 2014-01-22 | Disposition: A | Discharge status: Home / Self Care | Payer: BC Managed Care – PPO | Source: Ambulatory Visit | Attending: Internal Medicine | Admitting: Internal Medicine

## 2014-01-22 ENCOUNTER — Other Ambulatory Visit: Payer: Self-pay | Admitting: Internal Medicine

## 2014-01-22 DIAGNOSIS — Z1231 Encounter for screening mammogram for malignant neoplasm of breast: Secondary | ICD-10-CM | POA: Diagnosis present

## 2014-01-31 ENCOUNTER — Ambulatory Visit (INDEPENDENT_AMBULATORY_CARE_PROVIDER_SITE_OTHER): Payer: BC Managed Care – PPO | Admitting: Cardiology

## 2014-01-31 ENCOUNTER — Encounter: Payer: Self-pay | Admitting: Cardiology

## 2014-01-31 VITALS — BP 120/80 | HR 81 | Ht 62.0 in | Wt 147.0 lb

## 2014-01-31 DIAGNOSIS — R7309 Other abnormal glucose: Secondary | ICD-10-CM

## 2014-01-31 DIAGNOSIS — R7303 Prediabetes: Secondary | ICD-10-CM

## 2014-01-31 DIAGNOSIS — R079 Chest pain, unspecified: Secondary | ICD-10-CM

## 2014-01-31 NOTE — Progress Notes (Signed)
[place]. [address], [address] [place], [place]  [address] Phone: [phone] Fax:  [phone]  Date:  01/31/2014   ID:  Teresa Keller, DOB [birth year], MRN [id number]  PCP:  No PCP Per Patient   History of Present Illness: Teresa Keller is a 73 y.o. female with hypertension here for the assessment of chest pain. She was hospitalized briefly for chest discomfort. She thinks that her chest discomfort may have been secondary to drinking large amount of butter milk. She stays very active. Walks an hour every day on a treadmill and lifts weights. She has had colonoscopies with Dr. Benson Norway in the past. EKG on 01/01/14 demonstrated sinus rhythm with no other abnormalities. Had exercise tolerance test on 12/25/13 where she exercised for approximately 4 minutes, upsloping ST depression, nondiagnostic were noted, no chest pain. Stress test is considered low risk.  She has not been having any chest pain since. She is very active. 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  Wt Readings from Last 3 Encounters:  01/31/14 147 lb (66.679 kg)  01/01/14 150 lb 1.6 oz (68.085 kg)  12/25/13 145 lb (65.772 kg)     Past Medical History  Diagnosis Date  . Arthritis   . Uncontrolled hypertension, stage 1 08/26/2012    Past Surgical History  Procedure Laterality Date  . Tubal ligation    . Cholecystectomy  october 2012  . Ercp  08/22/2012    Procedure: ENDOSCOPIC RETROGRADE CHOLANGIOPANCREATOGRAPHY (ERCP);  Surgeon: Inda Castle, MD;  Location: [place];  Service: Endoscopy;  Laterality: N/A;  . Eus  11/21/2012    Procedure: UPPER ENDOSCOPIC ULTRASOUND (EUS) LINEAR;  Surgeon: Milus Banister, MD;  Location: [facility];  Service: Endoscopy;  Laterality: N/A;  . Esophagogastroduodenoscopy  04/25/13    Surveillance at [facility] - Normal appearing ampulla; No residual adenomatous tissue seen  . Ventral hernia repair  08/2011    s/p chole, at trochar  site, by Dr. Lucia Gaskins  . Ercp N/A 07/31/2013    Procedure: ENDOSCOPIC RETROGRADE CHOLANGIOPANCREATOGRAPHY (ERCP);  Surgeon: Irene Shipper, MD;  Location: [facility]COPY;  Service: Endoscopy;  Laterality: N/A;    Current Outpatient Prescriptions  Medication Sig Dispense Refill  . Calcium Carbonate-Vitamin D (CALCIUM-VITAMIN D) 500-200 MG-UNIT per tablet Take 1 tablet by mouth 2 (two) times daily with a meal.      . esomeprazole (NEXIUM) 20 MG capsule Take 20 mg by mouth daily at 12 noon.      . fish oil-omega-3 fatty acids 1000 MG capsule Take 1 g by mouth 3 (three) times daily.      . Multiple Vitamin (MULTIVITAMIN WITH MINERALS) TABS Take 1 tablet by mouth daily.      . polyethylene glycol (MIRALAX / GLYCOLAX) packet Take 17 g by mouth as directed. 3 days on and 3 days off      . ursodiol (ACTIGALL) 500 MG tablet Take 1 tablet (500 mg total) by mouth 2 (two) times daily.  60 tablet  11   No current facility-administered medications for this visit.    Allergies:    Allergies  Allergen Reactions  . Codeine     vomiting  . Sulfur     Social History:  The patient  reports that she has never smoked. She has never used smokeless tobacco. She reports that she does not drink alcohol or use illicit drugs.   Family History  Problem Relation Age of Onset  . Hypertension Mother   . Hypertension Sister   . Colon cancer Mother     ROS:  Please see the history of present illness.   No further chest pain, no shortness of breath, no orthopnea, no PND.   All other systems reviewed and negative.   PHYSICAL EXAM: VS:  BP 120/80  Pulse 81  Ht 5\' 2"  (1.575 m)  Wt 147 lb (66.679 kg)  BMI 26.88 kg/m2 Well nourished, well developed, in no acute distress HEENT: normal, [place]/AT, EOMI Neck: no JVD, normal carotid upstroke, no bruit Cardiac:  normal S1, S2; RRR; no murmur Lungs:  clear to auscultation bilaterally, no wheezing, rhonchi or rales Abd: soft, nontender, no hepatomegaly, no bruits Ext: no  edema, 2+ distal pulses Skin: warm and dry GU: deferred Neuro: no focal abnormalities noted, AAO x 3  EKG:  Normal sinus rhythm, no ST changes, hospital records reviewed. Lab work reviewed. Stress test reviewed.  ASSESSMENT AND PLAN:  1. Chest pain-resolved. Likely gastrointestinal. She is quite active, no exertional anginal symptoms. She states that she takes good care of herself. LDL cholesterol is 103. Excellent creatinine. No further cardiac testing warranted at this time. She will tell me if she has any further worrisome symptoms. It was a pleasure meeting her. Continue with primary prevention. 2. Prediabetes-hemoglobin A1c 6.2. Dietary modification. Exercise.  Signed, Candee Furbish, MD [facility]  01/31/2014 10:48 AM

## 2014-01-31 NOTE — Patient Instructions (Signed)
Your physician recommends that you continue on your current medications as directed. Please refer to the Current Medication list given to you today.   Your physician recommends that you schedule a follow-up appointment as needed  

## 2014-11-06 ENCOUNTER — Telehealth: Payer: Self-pay | Admitting: Gastroenterology

## 2014-11-06 NOTE — Telephone Encounter (Signed)
Pt complains of abd pain, has history of CBD stones,  See 08/2013 office note.  Scheduled with Nevin Bloodgood on 11/07/14 9 am

## 2014-11-07 ENCOUNTER — Other Ambulatory Visit (INDEPENDENT_AMBULATORY_CARE_PROVIDER_SITE_OTHER): Payer: BC Managed Care – PPO

## 2014-11-07 ENCOUNTER — Encounter: Payer: Self-pay | Admitting: Nurse Practitioner

## 2014-11-07 ENCOUNTER — Ambulatory Visit (INDEPENDENT_AMBULATORY_CARE_PROVIDER_SITE_OTHER): Payer: BC Managed Care – PPO | Admitting: Nurse Practitioner

## 2014-11-07 VITALS — BP 120/60 | HR 74 | Ht 62.0 in | Wt 147.8 lb

## 2014-11-07 DIAGNOSIS — R1013 Epigastric pain: Secondary | ICD-10-CM

## 2014-11-07 LAB — COMPREHENSIVE METABOLIC PANEL
ALK PHOS: 85 U/L (ref 39–117)
ALT: 11 U/L (ref 0–35)
AST: 17 U/L (ref 0–37)
Albumin: 4 g/dL (ref 3.5–5.2)
BUN: 11 mg/dL (ref 6–23)
CALCIUM: 9.5 mg/dL (ref 8.4–10.5)
CHLORIDE: 104 meq/L (ref 96–112)
CO2: 24 mEq/L (ref 19–32)
Creatinine, Ser: 0.6 mg/dL (ref 0.4–1.2)
GFR: 123.63 mL/min (ref >60.00)
Glucose, Bld: 90 mg/dL (ref 70–99)
POTASSIUM: 4.3 meq/L (ref 3.5–5.1)
SODIUM: 136 meq/L (ref 135–145)
TOTAL PROTEIN: 7.6 g/dL (ref 6.0–8.3)
Total Bilirubin: 0.3 mg/dL (ref 0.2–1.2)

## 2014-11-07 MED ORDER — TRAMADOL HCL 50 MG PO TABS: 50.0000 mg | ORAL_TABLET | Freq: Four times a day (QID) | ORAL | Status: DC | PRN

## 2014-11-07 NOTE — Progress Notes (Signed)
     History of Present Illness:   Patient is a 73 year old female known to Dr. Ardis Hughs. She has a history of choledocholithiasis and is status post ERCP with stone extraction and biliary sphinctertomy October 2013 for common bile duct stone. Repeat ERCP with extension of sphincterotomy and stone extraction done Sept 2014 for recurrent choledocholithiasis. 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  On Friday patient developed intermittent, nonradiating epigastric pain unrelated to eating. Yesterday pain became constant. No significant nausea. No chills. Patient describes pain as exactly like when she had recurrent, bile duct stones. No NSAID use. She is on a daily PPI  Current Medications, Allergies, Past Medical History, Past Surgical History, Family History and Social History were reviewed in [employer] record.  Physical Exam: General: Pleasant, well developed , black female in no acute distress Head: Normocephalic and atraumatic Eyes:  sclerae anicteric, conjunctiva pink  Ears: Normal auditory acuity Lungs: Clear throughout to auscultation Heart: Regular rate and rhythm Abdomen: Soft, non distended, mild epigastric tenderness.  No masses, no hepatomegaly. Normal bowel sounds Musculoskeletal: Symmetrical with no gross deformities  Extremities: No edema  Neurological: Alert oriented x 4, grossly nonfocal Psychological:  Alert and cooperative. Normal mood and affect  Assessment and Recommendations:    73 year old female with recurrent epigastric pain reminiscent of choledocholithiasis. Patient is status post ERCP with stone extraction and sphincterotomy in 2013 and again in September 2014.  Will obtain labs today including see CMET and CBC. Will obtain an ultrasound to look for biliary duct dilation. I will call her with lab results and further recommendations

## 2014-11-07 NOTE — Patient Instructions (Addendum)
Please go to the basement level to have your labs drawn.  You have been scheduled for an abdominal ultrasound at [facility] Radiology (1st floor of hospital) on Mon 11-12-2014 at 8:00 am . Please arrive at 7:45 am  prior to your appointment for registration. Make certain not to have anything to eat or drink 6 hours prior to your appointment. Should you need to reschedule your appointment, please contact radiology at [phone]. This test typically takes about 30 minutes to perform.  We will call you when we have results. We faxed a prescription for Tramadol ( Ultram) to [facility].

## 2014-11-12 ENCOUNTER — Ambulatory Visit (HOSPITAL_COMMUNITY)
Admission: RE | Admit: 2014-11-12 | Discharge: 2014-11-12 | Disposition: A | Discharge status: Home / Self Care | Payer: BC Managed Care – PPO | Source: Ambulatory Visit | Attending: Nurse Practitioner | Admitting: Nurse Practitioner

## 2014-11-12 DIAGNOSIS — Z9049 Acquired absence of other specified parts of digestive tract: Secondary | ICD-10-CM | POA: Diagnosis not present

## 2014-11-12 DIAGNOSIS — R1013 Epigastric pain: Secondary | ICD-10-CM | POA: Insufficient documentation

## 2014-11-12 NOTE — Progress Notes (Signed)
i agree with the above note, plan. CMET normal. [place] pending.

## 2015-01-19 ENCOUNTER — Encounter (HOSPITAL_COMMUNITY): Payer: Self-pay | Admitting: *Deleted

## 2015-01-19 ENCOUNTER — Emergency Department (HOSPITAL_COMMUNITY): Payer: BC Managed Care – PPO

## 2015-01-19 ENCOUNTER — Emergency Department (HOSPITAL_COMMUNITY)
Admission: EM | Admit: 2015-01-19 | Discharge: 2015-01-20 | Disposition: A | Discharge status: Home / Self Care | Payer: BC Managed Care – PPO | Attending: Emergency Medicine | Admitting: Emergency Medicine

## 2015-01-19 DIAGNOSIS — Z8739 Personal history of other diseases of the musculoskeletal system and connective tissue: Secondary | ICD-10-CM | POA: Diagnosis not present

## 2015-01-19 DIAGNOSIS — Z8719 Personal history of other diseases of the digestive system: Secondary | ICD-10-CM | POA: Diagnosis not present

## 2015-01-19 DIAGNOSIS — R220 Localized swelling, mass and lump, head: Secondary | ICD-10-CM | POA: Diagnosis not present

## 2015-01-19 DIAGNOSIS — R200 Anesthesia of skin: Secondary | ICD-10-CM | POA: Diagnosis present

## 2015-01-19 DIAGNOSIS — I10 Essential (primary) hypertension: Secondary | ICD-10-CM | POA: Diagnosis not present

## 2015-01-19 DIAGNOSIS — Z79899 Other long term (current) drug therapy: Secondary | ICD-10-CM | POA: Diagnosis not present

## 2015-01-19 LAB — COMPREHENSIVE METABOLIC PANEL
ALT: 14 U/L (ref 0–35)
ANION GAP: 9 (ref 5–15)
AST: 17 U/L (ref 0–37)
Albumin: 3.7 g/dL (ref 3.5–5.2)
Alkaline Phosphatase: 84 U/L (ref 39–117)
BILIRUBIN TOTAL: 0.4 mg/dL (ref 0.3–1.2)
BUN: 8 mg/dL (ref 6–23)
CALCIUM: 9.6 mg/dL (ref 8.4–10.5)
CHLORIDE: 103 mmol/L (ref 96–112)
CO2: 26 mmol/L (ref 19–32)
CREATININE: 0.66 mg/dL (ref 0.50–1.10)
GFR calc Af Amer: >90 mL/min (ref >90)
GFR calc non Af Amer: 86 mL/min — ABNORMAL LOW (ref >90)
Glucose, Bld: 103 mg/dL — ABNORMAL HIGH (ref 70–99)
Potassium: 3.9 mmol/L (ref 3.5–5.1)
Sodium: 138 mmol/L (ref 135–145)
Total Protein: 7.9 g/dL (ref 6.0–8.3)

## 2015-01-19 LAB — APTT: aPTT: 43 seconds — ABNORMAL HIGH (ref 24–37)

## 2015-01-19 LAB — CBC
HCT: 39.4 % (ref 36.0–46.0)
HEMOGLOBIN: 13 g/dL (ref 12.0–15.0)
MCH: 27.8 pg (ref 26.0–34.0)
MCHC: 33 g/dL (ref 30.0–36.0)
MCV: 84.2 fL (ref 78.0–100.0)
PLATELETS: 302 10*3/uL (ref 150–400)
RBC: 4.68 MIL/uL (ref 3.87–5.11)
RDW: 15.5 % (ref 11.5–15.5)
WBC: 9.3 10*3/uL (ref 4.0–10.5)

## 2015-01-19 LAB — I-STAT TROPONIN, ED: Troponin i, poc: 0 ng/mL (ref 0.00–0.08)

## 2015-01-19 LAB — I-STAT CHEM 8, ED
BUN: 9 mg/dL (ref 6–23)
Calcium, Ion: 1.18 mmol/L (ref 1.13–1.30)
Chloride: 102 mmol/L (ref 96–112)
Creatinine, Ser: 0.7 mg/dL (ref 0.50–1.10)
Glucose, Bld: 101 mg/dL — ABNORMAL HIGH (ref 70–99)
HEMATOCRIT: 42 % (ref 36.0–46.0)
Hemoglobin: 14.3 g/dL (ref 12.0–15.0)
Potassium: 4 mmol/L (ref 3.5–5.1)
Sodium: 140 mmol/L (ref 135–145)
TCO2: 23 mmol/L (ref 0–100)

## 2015-01-19 LAB — PROTIME-INR
INR: 1.1 (ref 0.00–1.49)
PROTHROMBIN TIME: 14.4 s (ref 11.6–15.2)

## 2015-01-19 LAB — DIFFERENTIAL
BASOS ABS: 0.1 10*3/uL (ref 0.0–0.1)
BASOS PCT: 1 % (ref 0–1)
Eosinophils Absolute: 0.2 10*3/uL (ref 0.0–0.7)
Eosinophils Relative: 2 % (ref 0–5)
Lymphocytes Relative: 24 % (ref 12–46)
Lymphs Abs: 2.3 10*3/uL (ref 0.7–4.0)
Monocytes Absolute: 0.7 10*3/uL (ref 0.1–1.0)
Monocytes Relative: 8 % (ref 3–12)
NEUTROS ABS: 6.1 10*3/uL (ref 1.7–7.7)
Neutrophils Relative %: 65 % (ref 43–77)

## 2015-01-19 LAB — CBG MONITORING, ED: Glucose-Capillary: 114 mg/dL — ABNORMAL HIGH (ref 70–99)

## 2015-01-19 NOTE — ED Notes (Signed)
The pt is c/o lt face and head numbness since yesterday with some lt sided head pain.  No speech difficulty no arm or leg weakness.  No previous history.

## 2015-01-19 NOTE — ED Provider Notes (Signed)
CSN: [id number]     Arrival date & time 01/19/15  2021 History  This chart was scribed for Hoy Morn, MD by Hilda Lias, ED Scribe. This patient was seen in room A03C/A03C and the patient's care was started at 11:35 PM.    Chief Complaint  Patient presents with  . Numbness      The history is provided by the patient. No language interpreter was used.     HPI Comments: Teresa Keller is a 74 y.o. female who presents to the Emergency Department complaining of intermittent numbness, swelling, and pain on the left side of her face with associated visual changes an left-sided neck pain that has been present for three months. Pt states that it lasts a couple of days at a time, and that she has been taking ibuprofen for it, which she reports provides mild relief. Pt states that she presents to the ED tonight because it has felt worse than usual. Pt states that her left-sided neck pain begins at the base of the neck and radiates into the left cheek area. Pt denies fever and chills    Past Medical History  Diagnosis Date  . Arthritis   . Uncontrolled hypertension, stage 1 08/26/2012  . Common bile duct stone    Past Surgical History  Procedure Laterality Date  . Tubal ligation    . Cholecystectomy  october 2012  . Ercp  08/22/2012    Procedure: ENDOSCOPIC RETROGRADE CHOLANGIOPANCREATOGRAPHY (ERCP);  Surgeon: Inda Castle, MD;  Location: [place];  Service: Endoscopy;  Laterality: N/A;  . Eus  11/21/2012    Procedure: UPPER ENDOSCOPIC ULTRASOUND (EUS) LINEAR;  Surgeon: Milus Banister, MD;  Location: [facility];  Service: Endoscopy;  Laterality: N/A;  . Esophagogastroduodenoscopy  04/25/13    Surveillance at [facility] - Normal appearing ampulla; No residual adenomatous tissue seen  . Ventral hernia repair  08/2011    s/p chole, at trochar site, by Dr. Lucia Gaskins  . Ercp N/A 07/31/2013    Procedure: ENDOSCOPIC RETROGRADE CHOLANGIOPANCREATOGRAPHY (ERCP);  Surgeon: Irene Shipper, MD;   Location: [facility] ENDOSCOPY;  Service: Endoscopy;  Laterality: N/A;   Family History  Problem Relation Age of Onset  . Hypertension Mother   . Hypertension Sister   . Colon cancer Mother 6   History  Substance Use Topics  . Smoking status: Never Smoker   . Smokeless tobacco: Never Used  . Alcohol Use: No   OB History    No data available     Review of Systems  A complete 10 system review of systems was obtained and all systems are negative except as noted in the HPI and PMH.    Allergies  Codeine and Sulfur  Home Medications   Prior to Admission medications   Medication Sig Start Date End Date Taking? Authorizing Provider  Calcium Carbonate-Vitamin D (CALCIUM-VITAMIN D) 500-200 MG-UNIT per tablet Take 1 tablet by mouth 2 (two) times daily with a meal.   Yes Historical Provider, MD  esomeprazole (NEXIUM) 20 MG capsule Take 20 mg by mouth daily at 12 noon.   Yes Historical Provider, MD  fish oil-omega-3 fatty acids 1000 MG capsule Take 1 g by mouth 3 (three) times daily.   Yes Historical Provider, MD  Multiple Vitamin (MULTIVITAMIN WITH MINERALS) TABS Take 1 tablet by mouth daily.   Yes Historical Provider, MD  polyethylene glycol (MIRALAX / GLYCOLAX) packet Take 17 g by mouth as directed. 3 days on and 3 days off  Yes Historical Provider, MD  traMADol (ULTRAM) 50 MG tablet Take 1 tablet (50 mg total) by mouth every 6 (six) hours as needed. Patient not taking: Reported on 01/19/2015 11/07/14   Willia Craze, NP   BP 129/78 mmHg  Pulse 83  Temp(Src) 97.9 F (36.6 C) (Oral)  Resp 14  SpO2 98% Physical Exam  Constitutional: She is oriented to person, place, and time. She appears well-developed and well-nourished. No distress.  HENT:  Head: Normocephalic and atraumatic.  Posterior pharynx is normal Uvula midline Bilateral TMs normal  Mild left-sided facial swelling overlying her maxillary sinus  Forehead is normal  No cystic masses in left cheek No preauricular  tenderness or swelling on the left  Eyes: EOM are normal.  Neck: Normal range of motion. No thyromegaly present.  No lymphadenopathy Thyroid normal  Cardiovascular: Normal rate, regular rhythm and normal heart sounds.   Pulmonary/Chest: Effort normal and breath sounds normal.  Abdominal: Soft. She exhibits no distension. There is no tenderness.  Musculoskeletal: Normal range of motion.  Neurological: She is alert and oriented to person, place, and time.  Skin: Skin is warm and dry.  Psychiatric: She has a normal mood and affect. Judgment normal.  Nursing note and vitals reviewed.   ED Course  Procedures (including critical care time)  DIAGNOSTIC STUDIES: Oxygen Saturation is 98% on RA, normal by my interpretation.    COORDINATION OF CARE: 11:48 PM Discussed treatment plan with pt at bedside and pt agreed to plan.   Labs Review Labs Reviewed  APTT - Abnormal; Notable for the following:    aPTT 43 (*)    All other components within normal limits  COMPREHENSIVE METABOLIC PANEL - Abnormal; Notable for the following:    Glucose, Bld 103 (*)    GFR calc non Af Amer 86 (*)    All other components within normal limits  CBG MONITORING, ED - Abnormal; Notable for the following:    Glucose-Capillary 114 (*)    All other components within normal limits  I-STAT CHEM 8, ED - Abnormal; Notable for the following:    Glucose, Bld 101 (*)    All other components within normal limits  PROTIME-INR  CBC  DIFFERENTIAL  I-STAT TROPOININ, ED    Imaging Review Ct Head (brain) Wo Contrast  01/19/2015   CLINICAL DATA:  Initial evaluation left facial numbness  EXAM: CT HEAD WITHOUT CONTRAST  TECHNIQUE: Contiguous axial images were obtained from the base of the skull through the vertex without intravenous contrast.  COMPARISON:  01/23/2004, 07/29/2013  FINDINGS: Moderate diffuse atrophy. Mild low attenuation in the deep white matter. Age-related basal ganglia hyperattenuation. No hydrocephalus. No  evidence of vascular territory infarct. No intracranial hemorrhage or extra-axial fluid. Calvarium intact.  IMPRESSION: No acute intracranial findings. Age-related involutional change noted.   Electronically Signed   By: Skipper Cliche M.D.   On: 01/19/2015 22:17  I personally reviewed the imaging tests through PACS system I reviewed available ER/hospitalization records through the EMR    EKG Interpretation None      MDM   Final diagnoses:  None    Overall the patient is well-appearing.  Doubt stroke.  She has mild swelling of her left cheek just overlying the maxillary sinus.  There is no palpable mass in this region.  There is no erythema or fluctuance.  Doubt abscess.  Doubt underlying mass.  Gums are normal.  No jaw pain.  No ear pain.  This is been an intermittent issue over  the past 3 months.  It comes and goes.  She'll need to follow-up with your nose and throat surgery as well as her primary care physician.  No indication for additional workup in emergency department tonight.  Overall well appearing.  Vitals are normal.   I personally performed the services described in this documentation, which was scribed in my presence. The recorded information has been reviewed and is accurate.      Hoy Morn, MD 01/20/15 [id number]

## 2015-01-20 MED ORDER — IBUPROFEN 200 MG PO TABS
600.0000 mg | ORAL_TABLET | Freq: Once | ORAL | Status: AC
  Administered 2015-01-20: 600 mg via ORAL
  Filled 2015-01-20: qty 3

## 2015-01-20 MED ORDER — HYDROCODONE-ACETAMINOPHEN 5-325 MG PO TABS: 1.0000 | ORAL_TABLET | ORAL | Status: DC | PRN

## 2015-01-20 MED ORDER — OXYCODONE-ACETAMINOPHEN 5-325 MG PO TABS
1.0000 | ORAL_TABLET | Freq: Once | ORAL | Status: AC
  Administered 2015-01-20: 1 via ORAL
  Filled 2015-01-20: qty 1

## 2015-01-20 NOTE — ED Notes (Signed)
Pt. Left with all belongings and refused wheelchair 

## 2017-11-30 ENCOUNTER — Encounter (HOSPITAL_COMMUNITY): Payer: Self-pay

## 2017-11-30 ENCOUNTER — Emergency Department (HOSPITAL_COMMUNITY): Payer: BC Managed Care – PPO

## 2017-11-30 ENCOUNTER — Emergency Department (HOSPITAL_COMMUNITY)
Admission: EM | Admit: 2017-11-30 | Discharge: 2017-11-30 | Disposition: A | Discharge status: Home / Self Care | Payer: BC Managed Care – PPO | Attending: Emergency Medicine | Admitting: Emergency Medicine

## 2017-11-30 ENCOUNTER — Other Ambulatory Visit: Payer: Self-pay

## 2017-11-30 DIAGNOSIS — I10 Essential (primary) hypertension: Secondary | ICD-10-CM | POA: Diagnosis not present

## 2017-11-30 DIAGNOSIS — K5792 Diverticulitis of intestine, part unspecified, without perforation or abscess without bleeding: Secondary | ICD-10-CM | POA: Diagnosis not present

## 2017-11-30 DIAGNOSIS — R1032 Left lower quadrant pain: Secondary | ICD-10-CM | POA: Diagnosis present

## 2017-11-30 DIAGNOSIS — Z79899 Other long term (current) drug therapy: Secondary | ICD-10-CM | POA: Diagnosis not present

## 2017-11-30 LAB — COMPREHENSIVE METABOLIC PANEL
ALT: 15 U/L (ref 14–54)
AST: 18 U/L (ref 15–41)
Albumin: 3.8 g/dL (ref 3.5–5.0)
Alkaline Phosphatase: 94 U/L (ref 38–126)
Anion gap: 9 (ref 5–15)
BUN: 11 mg/dL (ref 6–20)
CO2: 28 mmol/L (ref 22–32)
Calcium: 9.3 mg/dL (ref 8.9–10.3)
Chloride: 102 mmol/L (ref 101–111)
Creatinine, Ser: 0.54 mg/dL (ref 0.44–1.00)
GFR calc Af Amer: >60 mL/min (ref >60)
GFR calc non Af Amer: >60 mL/min (ref >60)
Glucose, Bld: 94 mg/dL (ref 65–99)
Potassium: 4.2 mmol/L (ref 3.5–5.1)
Sodium: 139 mmol/L (ref 135–145)
Total Bilirubin: 0.4 mg/dL (ref 0.3–1.2)
Total Protein: 8.1 g/dL (ref 6.5–8.1)

## 2017-11-30 LAB — URINALYSIS, ROUTINE W REFLEX MICROSCOPIC
Bilirubin Urine: NEGATIVE
Glucose, UA: NEGATIVE mg/dL
Ketones, ur: NEGATIVE mg/dL
Leukocytes, UA: NEGATIVE
Nitrite: NEGATIVE
Protein, ur: NEGATIVE mg/dL
Specific Gravity, Urine: 1.015 (ref 1.005–1.030)
pH: 6 (ref 5.0–8.0)

## 2017-11-30 LAB — CBC
HCT: 40.3 % (ref 36.0–46.0)
Hemoglobin: 13.3 g/dL (ref 12.0–15.0)
MCH: 28.2 pg (ref 26.0–34.0)
MCHC: 33 g/dL (ref 30.0–36.0)
MCV: 85.4 fL (ref 78.0–100.0)
Platelets: 292 10*3/uL (ref 150–400)
RBC: 4.72 MIL/uL (ref 3.87–5.11)
RDW: 15.5 % (ref 11.5–15.5)
WBC: 8.2 10*3/uL (ref 4.0–10.5)

## 2017-11-30 LAB — LIPASE, BLOOD: Lipase: 17 U/L (ref 11–51)

## 2017-11-30 MED ORDER — SODIUM CHLORIDE 0.9 % IV SOLN
INTRAVENOUS | Status: DC
  Administered 2017-11-30: 09:00:00 via INTRAVENOUS

## 2017-11-30 MED ORDER — METRONIDAZOLE IN NACL 5-0.79 MG/ML-% IV SOLN
500.0000 mg | Freq: Once | INTRAVENOUS | Status: AC
  Administered 2017-11-30: 500 mg via INTRAVENOUS
  Filled 2017-11-30: qty 100

## 2017-11-30 MED ORDER — IOPAMIDOL (ISOVUE-300) INJECTION 61%
100.0000 mL | Freq: Once | INTRAVENOUS | Status: AC | PRN
  Administered 2017-11-30: 100 mL via INTRAVENOUS

## 2017-11-30 MED ORDER — METRONIDAZOLE 500 MG PO TABS: 500.0000 mg | ORAL_TABLET | Freq: Three times a day (TID) | ORAL | 0 refills | Status: DC

## 2017-11-30 MED ORDER — CIPROFLOXACIN HCL 500 MG PO TABS: 500.0000 mg | ORAL_TABLET | Freq: Two times a day (BID) | ORAL | 0 refills | Status: DC

## 2017-11-30 MED ORDER — MORPHINE SULFATE (PF) 4 MG/ML IV SOLN
4.0000 mg | Freq: Once | INTRAVENOUS | Status: AC
  Administered 2017-11-30: 4 mg via INTRAVENOUS
  Filled 2017-11-30: qty 1

## 2017-11-30 MED ORDER — IOPAMIDOL (ISOVUE-300) INJECTION 61%
INTRAVENOUS | Status: AC
  Filled 2017-11-30: qty 100

## 2017-11-30 MED ORDER — CIPROFLOXACIN HCL 500 MG PO TABS
500.0000 mg | ORAL_TABLET | Freq: Once | ORAL | Status: AC
  Administered 2017-11-30: 500 mg via ORAL
  Filled 2017-11-30: qty 1

## 2017-11-30 MED ORDER — OXYCODONE-ACETAMINOPHEN 5-325 MG PO TABS: 1.0000 | ORAL_TABLET | ORAL | 0 refills | Status: DC | PRN

## 2017-11-30 NOTE — ED Triage Notes (Signed)
Patient c/o right mid abdominal pain since yesterday. Patient denies any N/v/D.

## 2017-11-30 NOTE — ED Notes (Signed)
Pt is alert and orinted x 4 and is verbally responsive. PT reports 4/10 LLQ pain tender. Pt grandaughter is at bedside.

## 2017-11-30 NOTE — ED Provider Notes (Signed)
[facility] DEPT Provider Note   CSN: [id number] Arrival date & time: 11/30/17  4562     History   Chief Complaint Chief Complaint  Patient presents with  . Abdominal Pain    HPI Teresa Keller is a 77 y.o. female.  HPI  77 year old female with abdominal pain.  Left lower quadrant.  Gradual onset yesterday while sitting down at rest.  Pain has been persistent since then.  Pain is minimal when she still.  Exacerbated by movements.  No urinary complaints.  No nausea.  No vaginal bleeding or discharge.  Pain does not radiate.  Surgical history significant for cholecystectomy hernia repair.  She reports prior colonoscopy which have been unremarkable from what she remembers.  Denies any rectal bleeding or constipation.  She is tried taking meloxicam without improvement.  Past Medical History:  Diagnosis Date  . Arthritis   . Common bile duct stone     Patient Active Problem List   Diagnosis Date Noted  . Abdominal pain, epigastric 11/07/2014  . Preventive measure 01/01/2014  . Other screening mammogram 01/01/2014  . Chest pain 12/25/2013  . Choledocholithiasis 08/08/2013  . Uncontrolled hypertension, stage 1 08/26/2012  . Prediabetes 08/26/2012  . Anemia 08/26/2012  . 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 CHOLECYSTECTOMY  october 2012  . ERCP  08/22/2012   Procedure: ENDOSCOPIC RETROGRADE CHOLANGIOPANCREATOGRAPHY (ERCP);  Surgeon: Inda Castle, MD;  Location: [place];  Service: Endoscopy;  Laterality: N/A;  . ERCP N/A 07/31/2013   Procedure: ENDOSCOPIC RETROGRADE CHOLANGIOPANCREATOGRAPHY (ERCP);  Surgeon: Irene Shipper, MD;  Location: [facility] ENDOSCOPY;  Service: Endoscopy;  Laterality: N/A;  . ESOPHAGOGASTRODUODENOSCOPY  04/25/13   Surveillance at [facility] - Normal appearing ampulla; No residual adenomatous tissue seen  . EUS  11/21/2012   Procedure: UPPER ENDOSCOPIC ULTRASOUND (EUS)  LINEAR;  Surgeon: Milus Banister, MD;  Location: [facility]OS[facility];  Service: Endoscopy;  Laterality: N/A;  . TUBAL LIGATION    . VENTRAL HERNIA REPAIR  08/2011   s/p chole, at trochar site, by Dr. Lucia Gaskins    OB History    No data available       Home Medications    Prior to Admission medications   Medication Sig Start Date End Date Taking? Authorizing Provider  Calcium Carbonate-Vitamin D (CALCIUM-VITAMIN D) 500-200 MG-UNIT per tablet Take 1 tablet by mouth 2 (two) times daily with a meal.    [provider]  esomeprazole (NEXIUM) 20 MG capsule Take 20 mg by mouth daily at 12 noon.    [provider]  fish oil-omega-3 fatty acids 1000 MG capsule Take 1 g by mouth 3 (three) times daily.    [provider]  HYDROcodone-acetaminophen (NORCO/VICODIN) 5-325 MG per tablet Take 1 tablet by mouth every 4 (four) hours as needed for moderate pain. 01/20/15   Jola Schmidt, MD  Multiple Vitamin (MULTIVITAMIN WITH MINERALS) TABS Take 1 tablet by mouth daily.    [provider]  polyethylene glycol (MIRALAX / GLYCOLAX) packet Take 17 g by mouth as directed. 3 days on and 3 days off    [provider]  traMADol (ULTRAM) 50 MG tablet Take 1 tablet (50 mg total) by mouth every 6 (six) hours as needed. Patient not taking: Reported on 01/19/2015 11/07/14   Willia Craze, NP    Family History Family History  Problem Relation Age of Onset  . Hypertension Mother   .  Colon cancer Mother 13  . Hypertension Sister     Social History Social History   Tobacco Use  . Smoking status: Never Smoker  . Smokeless tobacco: Never Used  Substance Use Topics  . Alcohol use: No  . Drug use: No     Allergies   Codeine and Sulfur   Review of Systems Review of Systems  All systems reviewed and negative, other than as noted in HPI.  Physical Exam Updated Vital Signs BP 124/70   Pulse 82   Temp 98 F (36.7 C)   Resp 18   Ht 5\' 2"  (1.575 m)   Wt 68 kg (150  lb)   SpO2 98%   BMI 27.44 kg/m   Physical Exam  Constitutional: She appears well-developed and well-nourished. No distress.  HENT:  Head: Normocephalic and atraumatic.  Eyes: Conjunctivae are normal. Right eye exhibits no discharge. Left eye exhibits no discharge.  Neck: Neck supple.  Cardiovascular: Normal rate, regular rhythm and normal heart sounds. Exam reveals no gallop and no friction rub.  No murmur heard. Pulmonary/Chest: Effort normal and breath sounds normal. No respiratory distress.  Abdominal: Soft. She exhibits no distension. There is tenderness.  Abdomen is soft and nondistended.  Left lower quadrant tenderness and to a lesser degree suprapubically.  No overlying skin changes.  No inguinal adenopathy.  Can actively range L hip with no apparent difficulty.   Musculoskeletal: She exhibits no edema or tenderness.  Neurological: She is alert.  Skin: Skin is warm and dry.  Psychiatric: She has a normal mood and affect. Her behavior is normal. Thought content normal.  Nursing note and vitals reviewed.    ED Treatments / Results  Labs (all labs ordered are listed, but only abnormal results are displayed) Labs Reviewed  URINALYSIS, ROUTINE W REFLEX MICROSCOPIC - Abnormal; Notable for the following components:      Result Value   Hgb urine dipstick SMALL (*)    Bacteria, UA RARE (*)    Squamous Epithelial / LPF 0-5 (*)    All other components within normal limits  LIPASE, BLOOD  COMPREHENSIVE METABOLIC PANEL  CBC    EKG  EKG Interpretation None       Radiology Ct Abdomen Pelvis W Contrast  Result Date: 11/30/2017 CLINICAL DATA:  Acute left-sided abdominal pain. EXAM: CT ABDOMEN AND PELVIS WITH CONTRAST TECHNIQUE: Multidetector CT imaging of the abdomen and pelvis was performed using the standard protocol following bolus administration of intravenous contrast. CONTRAST:  13mL ISOVUE-300 IOPAMIDOL (ISOVUE-300) INJECTION 61% COMPARISON:  CT scan of July 23, 2013. FINDINGS: Lower chest: No acute abnormality. Hepatobiliary: No focal liver abnormality is seen. Status post cholecystectomy. No biliary dilatation. Pancreas: Unremarkable. No pancreatic ductal dilatation or surrounding inflammatory changes. Spleen: Normal in size without focal abnormality. Adrenals/Urinary Tract: Adrenal glands are unremarkable. Kidneys are normal, without renal calculi, focal lesion, or hydronephrosis. Bladder is unremarkable. Stomach/Bowel: The appendix and stomach appear normal. There is no evidence of bowel obstruction. Focal diverticulitis of proximal sigmoid colon is noted. Vascular/Lymphatic: Aortic atherosclerosis. No enlarged abdominal or pelvic lymph nodes. Reproductive: Uterus and bilateral adnexa are unremarkable. Other: No abdominal wall hernia or abnormality. No abdominopelvic ascites. Musculoskeletal: No acute or significant osseous findings. IMPRESSION: Focal proximal sigmoid diverticulitis. No definite abscess is noted. Aortic atherosclerosis. Electronically Signed   By: Marijo Conception, M.D.   On: 11/30/2017 11:19    Procedures Procedures (including critical care time)  Medications Ordered in ED Medications  0.9 %  sodium  chloride infusion ( Intravenous New Bag/Given 11/30/17 0929)  iopamidol (ISOVUE-300) 61 % injection (not administered)  ciprofloxacin (CIPRO) tablet 500 mg (not administered)  metroNIDAZOLE (FLAGYL) IVPB 500 mg (not administered)  morphine 4 MG/ML injection 4 mg (4 mg Intravenous Given 11/30/17 0930)  iopamidol (ISOVUE-300) 61 % injection 100 mL (100 mLs Intravenous Contrast Given 11/30/17 1052)     Initial Impression / Assessment and Plan / ED Course  I have reviewed the triage vital signs and the nursing notes.  Pertinent labs & imaging results that were available during my care of the patient were reviewed by me and considered in my medical decision making (see chart for details).     77 year old female with abdominal pain.   Diverticulitis?  She is focally tender in her left lower quadrant but no peritonitis and overall appears well.  Will check basic labs and urinalysis.  Will CT the abdomen and pelvis.  We will treat her symptoms in the meantime.   CT does show uncomplicated diverticulitis.  She is appropriate for outpatient treatment. Final Clinical Impressions(s) / ED Diagnoses   Final diagnoses:  Diverticulitis    ED Discharge Orders    None       Virgel Manifold, MD 11/30/17 1135

## 2017-12-24 ENCOUNTER — Other Ambulatory Visit: Payer: Self-pay | Admitting: Internal Medicine

## 2017-12-24 DIAGNOSIS — Z1231 Encounter for screening mammogram for malignant neoplasm of breast: Secondary | ICD-10-CM

## 2017-12-24 DIAGNOSIS — E2839 Other primary ovarian failure: Secondary | ICD-10-CM

## 2018-01-18 ENCOUNTER — Ambulatory Visit
Admission: RE | Admit: 2018-01-18 | Discharge: 2018-01-18 | Disposition: A | Discharge status: Home / Self Care | Payer: BC Managed Care – PPO | Source: Ambulatory Visit | Attending: Internal Medicine | Admitting: Internal Medicine

## 2018-01-18 DIAGNOSIS — E2839 Other primary ovarian failure: Secondary | ICD-10-CM

## 2018-01-18 DIAGNOSIS — Z1231 Encounter for screening mammogram for malignant neoplasm of breast: Secondary | ICD-10-CM

## 2018-01-24 ENCOUNTER — Ambulatory Visit: Payer: BC Managed Care – PPO | Admitting: Nurse Practitioner

## 2018-01-24 ENCOUNTER — Encounter: Payer: Self-pay | Admitting: Nurse Practitioner

## 2018-01-24 VITALS — BP 122/60 | HR 78 | Ht 62.0 in | Wt 153.0 lb

## 2018-01-24 DIAGNOSIS — Z860100 Personal history of colon polyps, unspecified: Secondary | ICD-10-CM

## 2018-01-24 DIAGNOSIS — K5792 Diverticulitis of intestine, part unspecified, without perforation or abscess without bleeding: Secondary | ICD-10-CM | POA: Diagnosis not present

## 2018-01-24 NOTE — Patient Instructions (Signed)
If you are age 76 or older, your body mass index should be between 23-30. Your Body mass index is 27.98 kg/m. If this is out of the aforementioned range listed, please consider follow up with your Primary Care Provider.  If you are age 63 or younger, your body mass index should be between 19-25. Your Body mass index is 27.98 kg/m. If this is out of the aformentioned range listed, please consider follow up with your Primary Care Provider.   Follow up as needed.  Thank you for choosing me and [facility].   Tye Savoy, NP

## 2018-01-24 NOTE — Progress Notes (Signed)
Chief Complaint: recent diverticulitis.   Referring Provider:  self  ASSESSMENT AND PLAN;   77 yo female with recent first episode of proximal sigmoid diverticulitis found on CT scan at ED mid January. Pain resolved with cipro / flagyl.  CBC, serum chemisitries in ED were unremarkable. Her bowels movements are normal. No rectal bleeding. No weight loss. Says PCP wanted to make sure colonoscopy not needed.  -Patient really prefers not to undergo colonoscopy at her age. She understands there is a small chance that CT scan findings could actually represent underlying colon cancer  Hx of adenomatous colon polyps. Last colonoscopy (she thinks) was by Dr. Collene Mares in Sept 2008.  A small sessile right colon polyp (tubular adenoma)  and a small hyperplastic rectal polyp were removed. 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 Ivan Croft at [facility] in 2014.   HPI:    Patient is a 77 year old female known to Dr. Ardis Keller. She has a remote hx of choledocholithiasis ,s/p ERCP with stone extraction and sphincterotomy. She is also s/p resection of large periampullary adenoma.   Patient seen in ED mid January for acute onset LLQ pain. Pain started day prior, never had this type of pain before. CT scan remarkable for focal sigmoid diveriticulitis. Labs unremarkable. Patient prescribed cipro / flagyl which she completed with complete resolution of pain. Patient says her PCP wanted her to see [place] to make sure no colonoscopy was needed. Her BMs are normal. No blood in stool. Weight is stable. No other GI complaints. No general medical complaints.   Past Medical History:  Diagnosis Date  . Arthritis   . Common bile duct stone   . Diverticulitis     Past Surgical History:  Procedure Laterality Date  . CHOLECYSTECTOMY  october 2012  . ERCP  08/22/2012   Procedure:  ENDOSCOPIC RETROGRADE CHOLANGIOPANCREATOGRAPHY (ERCP);  Surgeon: Inda Castle, MD;  Location: [place];  Service: Endoscopy;  Laterality: N/A;  . ERCP N/A 07/31/2013   Procedure: ENDOSCOPIC RETROGRADE CHOLANGIOPANCREATOGRAPHY (ERCP);  Surgeon: Irene Shipper, MD;  Location: [facility] ENDOSCOPY;  Service: Endoscopy;  Laterality: N/A;  . ESOPHAGOGASTRODUODENOSCOPY  04/25/13   Surveillance at [facility] - Normal appearing ampulla; No residual adenomatous tissue seen  . EUS  11/21/2012   Procedure: UPPER ENDOSCOPIC ULTRASOUND (EUS) LINEAR;  Surgeon: Milus Banister, MD;  Location: [facility] ENDOSCOPY;  Service: Endoscopy;  Laterality: N/A;  . TUBAL LIGATION    . VENTRAL HERNIA REPAIR  08/2011   s/p chole, at trochar site, by Dr. Lucia Gaskins   Family History  Problem Relation Age of Onset  . Hypertension Mother   . Colon cancer Mother 5  . Hypertension Sister    Social History   Tobacco Use  . Smoking status: Never Smoker  . Smokeless tobacco: Never Used  Substance Use Topics  . Alcohol use: No  . Drug use: No   Current Outpatient Medications  Medication Sig Dispense Refill  . Calcium Carbonate-Vitamin D (CALCIUM-VITAMIN D) 500-200 MG-UNIT per tablet Take 1 tablet by mouth 2 (two) times daily with a meal.    . ciprofloxacin (CIPRO) 500 MG tablet Take 1 tablet (500 mg total) by mouth every 12 (twelve) hours. 14 tablet 0  . esomeprazole (NEXIUM) 20 MG capsule Take 20 mg by mouth daily at 12 noon.    . fish oil-omega-3 fatty acids  1000 MG capsule Take 1 g by mouth 3 (three) times daily.    . meloxicam (MOBIC) 7.5 MG tablet Take 7.5 mg by mouth 2 (two) times daily.    . metroNIDAZOLE (FLAGYL) 500 MG tablet Take 1 tablet (500 mg total) by mouth 3 (three) times daily. 21 tablet 0  . Multiple Vitamin (MULTIVITAMIN WITH MINERALS) TABS Take 1 tablet by mouth daily.    . polyethylene glycol (MIRALAX / GLYCOLAX) packet Take 17 g by mouth as directed. 3 days on and 3 days off     No current facility-administered  medications for this visit.    Allergies  Allergen Reactions  . Codeine     vomiting  . Sulfur Nausea And Vomiting    Review of Systems: All systems reviewed and negative except where noted in HPI.    Physical Exam:    BP 122/60   Pulse 78   Ht 5\' 2"  (1.575 m)   Wt 153 lb (69.4 kg)   BMI 27.98 kg/m  Constitutional:  Well-developed, female in no acute distress. Psychiatric: Normal mood and affect. Behavior is normal. EENT: Pupils normal.  Conjunctivae are normal. No scleral icterus. Neck supple.  Cardiovascular: Normal rate, regular rhythm. No edema Pulmonary/chest: Effort normal and breath sounds normal. No wheezing, rales or rhonchi. Abdominal: Soft, nondistended. Nontender. Bowel sounds active throughout. There are no masses palpable. No hepatomegaly. Neurological: Alert and oriented to person place and time. Skin: Skin is warm and dry. No rashes noted.  Tye Savoy, NP  01/24/2018, 10:43 AM

## 2018-01-25 NOTE — Progress Notes (Signed)
I agree with the above note, plan 

## 2018-03-18 ENCOUNTER — Encounter (HOSPITAL_COMMUNITY): Payer: Self-pay | Admitting: Emergency Medicine

## 2018-03-18 ENCOUNTER — Other Ambulatory Visit: Payer: Self-pay

## 2018-03-18 ENCOUNTER — Emergency Department (HOSPITAL_COMMUNITY)
Admission: EM | Admit: 2018-03-18 | Discharge: 2018-03-18 | Disposition: A | Discharge status: Home / Self Care | Payer: BC Managed Care – PPO | Attending: Emergency Medicine | Admitting: Emergency Medicine

## 2018-03-18 DIAGNOSIS — N3000 Acute cystitis without hematuria: Secondary | ICD-10-CM | POA: Diagnosis not present

## 2018-03-18 DIAGNOSIS — R110 Nausea: Secondary | ICD-10-CM | POA: Insufficient documentation

## 2018-03-18 DIAGNOSIS — Z79899 Other long term (current) drug therapy: Secondary | ICD-10-CM | POA: Diagnosis not present

## 2018-03-18 DIAGNOSIS — M25511 Pain in right shoulder: Secondary | ICD-10-CM | POA: Insufficient documentation

## 2018-03-18 DIAGNOSIS — R42 Dizziness and giddiness: Secondary | ICD-10-CM | POA: Insufficient documentation

## 2018-03-18 DIAGNOSIS — R350 Frequency of micturition: Secondary | ICD-10-CM | POA: Insufficient documentation

## 2018-03-18 LAB — URINALYSIS, ROUTINE W REFLEX MICROSCOPIC
BACTERIA UA: NONE SEEN
Bilirubin Urine: NEGATIVE
GLUCOSE, UA: NEGATIVE mg/dL
Hgb urine dipstick: NEGATIVE
KETONES UR: NEGATIVE mg/dL
Nitrite: NEGATIVE
PH: 6 (ref 5.0–8.0)
Protein, ur: NEGATIVE mg/dL
Specific Gravity, Urine: 1.008 (ref 1.005–1.030)

## 2018-03-18 LAB — CBG MONITORING, ED: Glucose-Capillary: 82 mg/dL (ref 65–99)

## 2018-03-18 LAB — BASIC METABOLIC PANEL
ANION GAP: 9 (ref 5–15)
BUN: 12 mg/dL (ref 6–20)
CALCIUM: 9.4 mg/dL (ref 8.9–10.3)
CO2: 26 mmol/L (ref 22–32)
CREATININE: 0.56 mg/dL (ref 0.44–1.00)
Chloride: 107 mmol/L (ref 101–111)
GFR calc Af Amer: >60 mL/min (ref >60)
GFR calc non Af Amer: >60 mL/min (ref >60)
GLUCOSE: 83 mg/dL (ref 65–99)
Potassium: 3.7 mmol/L (ref 3.5–5.1)
Sodium: 142 mmol/L (ref 135–145)

## 2018-03-18 LAB — CBC
HCT: 37.7 % (ref 36.0–46.0)
HEMOGLOBIN: 12.2 g/dL (ref 12.0–15.0)
MCH: 27.9 pg (ref 26.0–34.0)
MCHC: 32.4 g/dL (ref 30.0–36.0)
MCV: 86.3 fL (ref 78.0–100.0)
Platelets: 296 10*3/uL (ref 150–400)
RBC: 4.37 MIL/uL (ref 3.87–5.11)
RDW: 16.5 % — ABNORMAL HIGH (ref 11.5–15.5)
WBC: 8.5 10*3/uL (ref 4.0–10.5)

## 2018-03-18 MED ORDER — CEPHALEXIN 500 MG PO CAPS: 1000.0000 mg | ORAL_CAPSULE | Freq: Two times a day (BID) | ORAL | 0 refills | Status: DC

## 2018-03-18 MED ORDER — MECLIZINE HCL 25 MG PO TABS
25.0000 mg | ORAL_TABLET | Freq: Once | ORAL | Status: AC
  Administered 2018-03-18: 25 mg via ORAL
  Filled 2018-03-18: qty 1

## 2018-03-18 MED ORDER — MECLIZINE HCL 25 MG PO TABS: 25.0000 mg | ORAL_TABLET | Freq: Three times a day (TID) | ORAL | 0 refills | Status: DC | PRN

## 2018-03-18 MED ORDER — CEPHALEXIN 500 MG PO CAPS
1000.0000 mg | ORAL_CAPSULE | Freq: Once | ORAL | Status: AC
  Administered 2018-03-18: 1000 mg via ORAL
  Filled 2018-03-18: qty 2

## 2018-03-18 NOTE — ED Provider Notes (Signed)
[facility] DEPT Provider Note   CSN: [id number] Arrival date & time: 03/18/18  [place]     History   Chief Complaint Chief Complaint  Patient presents with  . Dizziness  . Shoulder Pain    HPI Teresa Keller is a 77 y.o. female.  HPI Patient became dizzy 4 days ago.  Started quite quickly.  She reports that with getting up and walking she feels that she has to stagger and hold onto things.  With the dizziness, the room is been spinning and moving.  It is improved by keeping her head still or focusing in one spot.  No visual changes.  No focal weakness numbness or tingling.  She reports that she became nauseated but did not vomit.  She has been experiencing some ringing in her ears.  Patient reports that she has had vertigo in the past.  It was approximately 3 years ago.  Symptoms have improved since onset but are not completely gone. Patient also advises that she has had urinary frequency.  Not much burning or pain.  No fever.  She has a more distant history of a prior UTI.  No recent treatment.   Past Medical History:  Diagnosis Date  . Arthritis   . Common bile duct stone   . Diverticulitis     Patient Active Problem List   Diagnosis Date Noted  . Abdominal pain, epigastric 11/07/2014  . Preventive measure 01/01/2014  . Other screening mammogram 01/01/2014  . Chest pain 12/25/2013  . Choledocholithiasis 08/08/2013  . Uncontrolled hypertension, stage 1 08/26/2012  . Prediabetes 08/26/2012  . Anemia 08/26/2012  . 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 CHOLECYSTECTOMY  october 2012  . ERCP  08/22/2012   Procedure: ENDOSCOPIC RETROGRADE CHOLANGIOPANCREATOGRAPHY (ERCP);  Surgeon: Inda Castle, MD;  Location: [place];  Service: Endoscopy;  Laterality: N/A;  . ERCP N/A 07/31/2013   Procedure: ENDOSCOPIC RETROGRADE CHOLANGIOPANCREATOGRAPHY (ERCP);  Surgeon: Irene Shipper, MD;  Location: [facility]  ENDOSCOPY;  Service: Endoscopy;  Laterality: N/A;  . ESOPHAGOGASTRODUODENOSCOPY  04/25/13   Surveillance at [facility] - Normal appearing ampulla; No residual adenomatous tissue seen  . EUS  11/21/2012   Procedure: UPPER ENDOSCOPIC ULTRASOUND (EUS) LINEAR;  Surgeon: Milus Banister, MD;  Location: [facility] ENDOSCOPY;  Service: Endoscopy;  Laterality: N/A;  . TUBAL LIGATION    . VENTRAL HERNIA REPAIR  08/2011   s/p chole, at trochar site, by Dr. Lucia Gaskins     OB History   None      Home Medications    Prior to Admission medications   Medication Sig Start Date End Date Taking? Authorizing Provider  Calcium Carbonate-Vitamin D (CALCIUM-VITAMIN D) 500-200 MG-UNIT per tablet Take 1 tablet by mouth 2 (two) times daily with a meal.   Yes [provider]  fish oil-omega-3 fatty acids 1000 MG capsule Take 1 g by mouth 3 (three) times daily.   Yes [provider]  meloxicam (MOBIC) 7.5 MG tablet Take 7.5 mg by mouth 2 (two) times daily.   Yes [provider]  Multiple Vitamin (MULTIVITAMIN WITH MINERALS) TABS Take 1 tablet by mouth daily.   Yes [provider]  polyethylene glycol (MIRALAX / GLYCOLAX) packet Take 17 g by mouth as directed. 3 days on and 3 days off   Yes [provider]  cephALEXin (KEFLEX) 500 MG capsule Take 2 capsules (1,000 mg total) by mouth 2 (two) times  daily. 03/18/18   Charlesetta Shanks, MD  meclizine (ANTIVERT) 25 MG tablet Take 1 tablet (25 mg total) by mouth 3 (three) times daily as needed for dizziness. 03/18/18   Charlesetta Shanks, MD    Family History Family History  Problem Relation Age of Onset  . Hypertension Mother   . Colon cancer Mother 25  . Hypertension Sister     Social History Social History   Tobacco Use  . Smoking status: Never Smoker  . Smokeless tobacco: Never Used  Substance Use Topics  . Alcohol use: No  . Drug use: No     Allergies   Codeine and Sulfur   Review of Systems Review of Systems 10  Systems reviewed and are negative for acute change except as noted in the HPI.   Physical Exam Updated Vital Signs BP (!) 149/79   Pulse 65   Temp 97.8 F (36.6 C) (Oral)   Resp 19   Ht 5\' 2"  (1.575 m)   Wt 69.9 kg (154 lb)   SpO2 98%   BMI 28.17 kg/m   Physical Exam  Constitutional: She is oriented to person, place, and time. She appears well-developed and well-nourished.  HENT:  Head: Normocephalic and atraumatic.  Cerumen impaction on the left.  TM normal on the right.  Oropharynx moist and patent.  Eyes: Pupils are equal, round, and reactive to light. EOM are normal.  Neck: Neck supple.  Cardiovascular: Normal rate, regular rhythm, normal heart sounds and intact distal pulses.  Pulmonary/Chest: Effort normal and breath sounds normal.  Abdominal: Soft. Bowel sounds are normal. She exhibits no distension. There is no tenderness.  Musculoskeletal: Normal range of motion. She exhibits no edema or tenderness.  Neurological: She is alert and oriented to person, place, and time. She has normal strength. Coordination normal. GCS eye subscore is 4. GCS verbal subscore is 5. GCS motor subscore is 6.  Normal finger-nose exam bilaterally.  Normal cognitive function.  Speech clear.  Motor strength 5\5 upper lower extremities.  Patient did experience recreation of dizziness with Dix-Hallpike maneuver.  Skin: Skin is warm, dry and intact.  Psychiatric: She has a normal mood and affect.     ED Treatments / Results  Labs (all labs ordered are listed, but only abnormal results are displayed) Labs Reviewed  CBC - Abnormal; Notable for the following components:      Result Value   RDW 16.5 (*)    All other components within normal limits  URINALYSIS, ROUTINE W REFLEX MICROSCOPIC - Abnormal; Notable for the following components:   Color, Urine STRAW (*)    Leukocytes, UA SMALL (*)    All other components within normal limits  URINE CULTURE  BASIC METABOLIC PANEL  CBG MONITORING, ED     EKG EKG Interpretation  Date/Time:  Friday Mar 18 2018 19:30:06 EDT Ventricular Rate:  76 PR Interval:    QRS Duration: 89 QT Interval:  376 QTC Calculation: 423 R Axis:   59 Text Interpretation:  Sinus rhythm normal, no change from old Confirmed by Charlesetta Shanks [phone]) on 03/18/2018 10:25:53 PM   Radiology No results found.  Procedures Procedures (including critical care time)  Medications Ordered in ED Medications  cephALEXin (KEFLEX) capsule 1,000 mg (has no administration in time range)  meclizine (ANTIVERT) tablet 25 mg (has no administration in time range)     Initial Impression / Assessment and Plan / ED Course  I have reviewed the triage vital signs and the nursing notes.  Pertinent labs &  imaging results that were available during my care of the patient were reviewed by me and considered in my medical decision making (see chart for details).      Final Clinical Impressions(s) / ED Diagnoses   Final diagnoses:  Vertigo  Acute cystitis without hematuria  Patient describes symptoms that are typical for benign positional vertigo.  No neurologic deficits.  Patient does have prior history of vertigo.  She will be treated with meclizine.  Patient also describes symptoms of UTI.  Urinalysis is mildly positive.  In conjunction with having symptoms we will initiate Keflex twice daily.  Return precautions reviewed.  ED Discharge Orders        Ordered    cephALEXin (KEFLEX) 500 MG capsule  2 times daily     03/18/18 2227    meclizine (ANTIVERT) 25 MG tablet  3 times daily PRN     03/18/18 2227       Charlesetta Shanks, MD 03/18/18 2233

## 2018-03-18 NOTE — ED Triage Notes (Signed)
Patient is complaining of dizziness since Tuesday. Patient states it has not gone away. Patient is also having pain in right shoulder.

## 2018-03-18 NOTE — ED Notes (Signed)
Blue and gold top drawn in triage. 

## 2018-03-20 LAB — URINE CULTURE: Culture: <10000 — AB

## 2018-05-05 ENCOUNTER — Emergency Department (HOSPITAL_COMMUNITY)
Admission: EM | Admit: 2018-05-05 | Discharge: 2018-05-06 | Disposition: A | Discharge status: Home / Self Care | Payer: BC Managed Care – PPO | Attending: Emergency Medicine | Admitting: Emergency Medicine

## 2018-05-05 ENCOUNTER — Encounter (HOSPITAL_COMMUNITY): Payer: Self-pay

## 2018-05-05 ENCOUNTER — Emergency Department (HOSPITAL_COMMUNITY): Payer: BC Managed Care – PPO

## 2018-05-05 DIAGNOSIS — R519 Headache, unspecified: Secondary | ICD-10-CM

## 2018-05-05 DIAGNOSIS — R51 Headache: Secondary | ICD-10-CM | POA: Insufficient documentation

## 2018-05-05 DIAGNOSIS — Z79899 Other long term (current) drug therapy: Secondary | ICD-10-CM | POA: Insufficient documentation

## 2018-05-05 DIAGNOSIS — I10 Essential (primary) hypertension: Secondary | ICD-10-CM | POA: Insufficient documentation

## 2018-05-05 LAB — CBC WITH DIFFERENTIAL/PLATELET
Basophils Absolute: 0.1 10*3/uL (ref 0.0–0.1)
Basophils Relative: 1 %
EOS PCT: 4 %
Eosinophils Absolute: 0.3 10*3/uL (ref 0.0–0.7)
HCT: 38.6 % (ref 36.0–46.0)
Hemoglobin: 12.8 g/dL (ref 12.0–15.0)
LYMPHS PCT: 23 %
Lymphs Abs: 2.1 10*3/uL (ref 0.7–4.0)
MCH: 29 pg (ref 26.0–34.0)
MCHC: 33.2 g/dL (ref 30.0–36.0)
MCV: 87.5 fL (ref 78.0–100.0)
MONO ABS: 1.1 10*3/uL — AB (ref 0.1–1.0)
Monocytes Relative: 12 %
Neutro Abs: 5.6 10*3/uL (ref 1.7–7.7)
Neutrophils Relative %: 60 %
PLATELETS: 280 10*3/uL (ref 150–400)
RBC: 4.41 MIL/uL (ref 3.87–5.11)
RDW: 15 % (ref 11.5–15.5)
WBC: 9.1 10*3/uL (ref 4.0–10.5)

## 2018-05-05 LAB — BASIC METABOLIC PANEL
Anion gap: 7 (ref 5–15)
BUN: 12 mg/dL (ref 6–20)
CALCIUM: 9.5 mg/dL (ref 8.9–10.3)
CO2: 27 mmol/L (ref 22–32)
Chloride: 108 mmol/L (ref 101–111)
Creatinine, Ser: 0.63 mg/dL (ref 0.44–1.00)
GFR calc Af Amer: >60 mL/min (ref >60)
GLUCOSE: 103 mg/dL — AB (ref 65–99)
Potassium: 3.5 mmol/L (ref 3.5–5.1)
Sodium: 142 mmol/L (ref 135–145)

## 2018-05-05 MED ORDER — SODIUM CHLORIDE 0.9 % IV BOLUS
1000.0000 mL | Freq: Once | INTRAVENOUS | Status: AC
  Administered 2018-05-05: 1000 mL via INTRAVENOUS

## 2018-05-05 MED ORDER — DEXAMETHASONE SODIUM PHOSPHATE 10 MG/ML IJ SOLN
10.0000 mg | Freq: Once | INTRAMUSCULAR | Status: AC
Start: 2018-05-05 — End: 2018-05-06
  Administered 2018-05-06: 10 mg via INTRAVENOUS
  Filled 2018-05-05: qty 1

## 2018-05-05 MED ORDER — ACETAMINOPHEN 325 MG PO TABS
650.0000 mg | ORAL_TABLET | Freq: Once | ORAL | Status: AC
  Administered 2018-05-05: 650 mg via ORAL
  Filled 2018-05-05: qty 2

## 2018-05-05 NOTE — ED Provider Notes (Signed)
[facility] DEPT Provider Note   CSN: [id number] Arrival date & time: 05/05/18  2142     History   Chief Complaint Chief Complaint  Patient presents with  . Headache    HPI Teresa Keller is a 77 y.o. female.  77 year old female presents with complaint of right-sided headache.  Patient states headache was present when she woke up this morning, she took OTC headache relief and her headache resolved.  Patient had sudden return of her headache at 8:30 PM while sitting on the sofa watching TV.  Headache is stabbing in nature, constant however stabbing severity waxes and wanes.  Pain does not radiate, denies associated changes in vision, nausea, vomiting, changes in gait or speech.  Pain is also worse with palpation the right side of her head.  Patient states she checked her blood pressure and her blood pressure was elevated, no history of hypertension, denies chest pain.  No other complaints or concerns.     Past Medical History:  Diagnosis Date  . Arthritis   . Common bile duct stone   . Diverticulitis     Patient Active Problem List   Diagnosis Date Noted  . Abdominal pain, epigastric 11/07/2014  . Preventive measure 01/01/2014  . Other screening mammogram 01/01/2014  . Chest pain 12/25/2013  . Choledocholithiasis 08/08/2013  . Uncontrolled hypertension, stage 1 08/26/2012  . Prediabetes 08/26/2012  . Anemia 08/26/2012  . 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 CHOLECYSTECTOMY  october 2012  . ERCP  08/22/2012   Procedure: ENDOSCOPIC RETROGRADE CHOLANGIOPANCREATOGRAPHY (ERCP);  Surgeon: Inda Castle, MD;  Location: [facility];  Service: Endoscopy;  Laterality: N/A;  . ERCP N/A 07/31/2013   Procedure: ENDOSCOPIC RETROGRADE CHOLANGIOPANCREATOGRAPHY (ERCP);  Surgeon: Irene Shipper, MD;  Location: [facility] ENDOSCOPY;  Service: Endoscopy;  Laterality: N/A;  . ESOPHAGOGASTRODUODENOSCOPY  04/25/13   Surveillance at [facility] - Normal appearing ampulla; No residual adenomatous tissue seen  . EUS  11/21/2012   Procedure: UPPER ENDOSCOPIC ULTRASOUND (EUS) LINEAR;  Surgeon: Milus Banister, MD;  Location: [facility] ENDOSCOPY;  Service: Endoscopy;  Laterality: N/A;  . TUBAL LIGATION    . VENTRAL HERNIA REPAIR  08/2011   s/p chole, at trochar site, by Dr. Lucia Gaskins     OB History   None      Home Medications    Prior to Admission medications   Medication Sig Start Date End Date Taking? Authorizing Provider  Calcium Carbonate-Vitamin D (CALCIUM-VITAMIN D) 500-200 MG-UNIT per tablet Take 1 tablet by mouth 2 (two) times daily with a meal.   Yes [provider]  fish oil-omega-3 fatty acids 1000 MG capsule Take 1 g by mouth 3 (three) times daily.   Yes [provider]  ibuprofen (ADVIL,MOTRIN) 200 MG tablet Take 400 mg by mouth every 6 (six) hours as needed for moderate pain.   Yes [provider]  meloxicam (MOBIC) 7.5 MG tablet Take 7.5 mg by mouth 2 (two) times daily.   Yes [provider]  Multiple Vitamin (MULTIVITAMIN WITH MINERALS) TABS Take 1 tablet by mouth daily.   Yes [provider]    Family History Family History  Problem Relation Age of Onset  . Hypertension Mother   . Colon cancer Mother 63  . Hypertension Sister     Social History Social History   Tobacco Use  . Smoking status: Never Smoker  . Smokeless tobacco: Never Used  Substance  Use Topics  . Alcohol use: No  . Drug use: No     Allergies   Codeine and Sulfur   Review of Systems Review of Systems  Constitutional: Negative for fever.  Eyes: Negative for pain and visual disturbance.  Respiratory: Negative for shortness of breath.   Cardiovascular: Negative for chest pain.  Gastrointestinal: Negative for nausea and vomiting.  Genitourinary: Negative for dysuria, frequency and urgency.  Musculoskeletal: Negative for back pain, neck pain and neck stiffness.  Skin:  Negative for rash and wound.  Neurological: Positive for headaches. Negative for dizziness and weakness.  Hematological: Does not bruise/bleed easily.  Psychiatric/Behavioral: Negative for confusion.  All other systems reviewed and are negative.    Physical Exam Updated Vital Signs BP (!) 180/85 (BP Location: Left Arm)   Pulse 95   Temp 97.6 F (36.4 C) (Oral)   Resp 18   SpO2 97%   Physical Exam  Constitutional: She is oriented to person, place, and time. She appears well-developed and well-nourished.  Appears uncomfortable, holding right side of head  HENT:  Head: Normocephalic and atraumatic.    Eyes: Pupils are equal, round, and reactive to light. Conjunctivae and EOM are normal.  Neck: Neck supple.  Cardiovascular: Normal rate, regular rhythm, normal heart sounds and intact distal pulses.  No murmur heard. Pulmonary/Chest: Effort normal and breath sounds normal. No respiratory distress.  Abdominal: Soft. There is no tenderness.  Musculoskeletal: She exhibits no edema.  Neurological: She is alert and oriented to person, place, and time. She has normal strength. She is not disoriented. No cranial nerve deficit or sensory deficit. GCS eye subscore is 4. GCS verbal subscore is 5. GCS motor subscore is 6.  Skin: Skin is warm and dry.  Psychiatric: She has a normal mood and affect. Her behavior is normal.  Nursing note and vitals reviewed.    ED Treatments / Results  Labs (all labs ordered are listed, but only abnormal results are displayed) Labs Reviewed  CBC WITH DIFFERENTIAL/PLATELET - Abnormal; Notable for the following components:      Result Value   Monocytes Absolute 1.1 (*)    All other components within normal limits  BASIC METABOLIC PANEL - Abnormal; Notable for the following components:   Glucose, Bld 103 (*)    All other components within normal limits    EKG None  Radiology Ct Head Wo Contrast  Result Date: 05/05/2018 CLINICAL DATA:  77 year old  female with sharp right side head pain today. Headache. EXAM: CT HEAD WITHOUT CONTRAST TECHNIQUE: Contiguous axial images were obtained from the base of the skull through the vertex without intravenous contrast. COMPARISON:  Head CT without contrast 01/19/2015 and earlier. FINDINGS: Brain: Cerebral volume has not significantly changed since 2016. No midline shift, ventriculomegaly, mass effect, evidence of mass lesion, intracranial hemorrhage or evidence of cortically based acute infarction. Gray-white matter differentiation is within normal limits throughout the brain. Chronic dystrophic basal ganglia calcifications. Chronic parieto-occipital sulcus region volume loss but no definite cortical encephalomalacia. Vascular: Calcified atherosclerosis at the skull base. No suspicious intracranial vascular hyperdensity. Skull: Stable and negative. Sinuses/Orbits: Visualized paranasal sinuses and mastoids are stable and well pneumatized. Other: Negative orbit and scalp soft tissues. IMPRESSION: No acute intracranial abnormality. Stable noncontrast CT appearance of the brain since 2016. Electronically Signed   By: Genevie Ann M.D.   On: 05/05/2018 22:27    Procedures Procedures (including critical care time)  Medications Ordered in ED Medications  sodium chloride 0.9 % bolus 1,000 mL (  1,000 mLs Intravenous New Bag/Given 05/05/18 2250)  acetaminophen (TYLENOL) tablet 650 mg (has no administration in time range)  dexamethasone (DECADRON) injection 10 mg (has no administration in time range)     Initial Impression / Assessment and Plan / ED Course  I have reviewed the triage vital signs and the nursing notes.  Pertinent labs & imaging results that were available during my care of the patient were reviewed by me and considered in my medical decision making (see chart for details).  Clinical Course as of May 05 2341  Thu May 05, 2018  2338 76yo female presents with complaint of right side headache, onset this  morning upon waking, resolved after taking a motrin and returned this evening around 8:30PM. Patient did not take anything else for her headache and came to the Er. Denies any associated symptoms. Patient's blood pressure was elevated in triage, she denies history of high blood pressure.  Her medical record reports stage I hypertension. Her neurological exam is unremarkable.  She does have tenderness to the right parietal area. CT head is negative for intracranial hemorrhage, mass. Patient was seen by Dr. Sabra Heck who agrees with plan. Labs are unremarkable- no evidence of anemia or electrolyte abnormality. Patient was given IV fluids and Tylenol, allergy to codeine- causes vomiting and declines any similar medications tonight. Patient will be given Decadron and can be admitted when her IV fluids are complete.    [LM]    Clinical Course User Index [LM] Tacy Learn, PA-C    Final Clinical Impressions(s) / ED Diagnoses   Final diagnoses:  Nonintractable headache, unspecified chronicity pattern, unspecified headache type    ED Discharge Orders    None       Roque Lias 05/05/18 2342    Noemi Chapel, MD 05/08/18 [phone]

## 2018-05-05 NOTE — ED Triage Notes (Signed)
Pt complains of right sided head pain all day, she also has high blood pressure and has never been diagnosed with hypertension

## 2018-05-05 NOTE — Discharge Instructions (Signed)
Follow up with your doctor for recheck. Return to the ER for worsening or concerning symptoms.

## 2018-05-05 NOTE — ED Provider Notes (Signed)
The patient is an elderly 76 year old female, she reports that she does have some intermittent headaches over time and states that she has had headaches similar to this occasionally.  She woke up this morning with a stabbing right-sided pain in the temporoparietal area.  She thinks that it sometimes worse when she pushes on it, sometimes better, it comes on for a couple minutes then goes away.  Nothing seems to make it better or worse, not associated with any neurologic complaints whatsoever nor is there any changes in vision.  On exam there is no tenderness over the temporal artery, neurologic exam is very normal with speech coordination strength sensation and cranial nerves III through XII.  Vital signs show mild hypertension, not out of the range for normal for her age.  CT negative, doubt subarachnoid hemorrhage or aneurysm, well-appearing, anticipate ability to discharge home.  Given instructions for return.  Medical screening examination/treatment/procedure(s) were conducted as a shared visit with non-physician practitioner(s) and myself.  I personally evaluated the patient during the encounter.  Clinical Impression:   Final diagnoses:  Nonintractable headache, unspecified chronicity pattern, unspecified headache type         Noemi Chapel, MD 05/08/18 2345

## 2019-03-10 ENCOUNTER — Other Ambulatory Visit: Payer: Self-pay | Admitting: Internal Medicine

## 2019-03-10 DIAGNOSIS — Z1231 Encounter for screening mammogram for malignant neoplasm of breast: Secondary | ICD-10-CM

## 2019-05-08 ENCOUNTER — Ambulatory Visit
Admission: RE | Admit: 2019-05-08 | Discharge: 2019-05-08 | Disposition: A | Discharge status: Home / Self Care | Payer: BC Managed Care – PPO | Source: Ambulatory Visit | Attending: Internal Medicine | Admitting: Internal Medicine

## 2019-05-08 ENCOUNTER — Other Ambulatory Visit: Payer: Self-pay

## 2019-05-08 DIAGNOSIS — Z1231 Encounter for screening mammogram for malignant neoplasm of breast: Secondary | ICD-10-CM

## 2019-09-08 ENCOUNTER — Other Ambulatory Visit: Payer: Self-pay

## 2019-09-08 ENCOUNTER — Emergency Department (HOSPITAL_COMMUNITY)
Admission: EM | Admit: 2019-09-08 | Discharge: 2019-09-08 | Disposition: A | Discharge status: Home / Self Care | Payer: BC Managed Care – PPO | Attending: Emergency Medicine | Admitting: Emergency Medicine

## 2019-09-08 ENCOUNTER — Encounter (HOSPITAL_COMMUNITY): Payer: Self-pay | Admitting: Emergency Medicine

## 2019-09-08 DIAGNOSIS — M792 Neuralgia and neuritis, unspecified: Secondary | ICD-10-CM | POA: Diagnosis not present

## 2019-09-08 DIAGNOSIS — R110 Nausea: Secondary | ICD-10-CM | POA: Diagnosis not present

## 2019-09-08 DIAGNOSIS — R519 Headache, unspecified: Secondary | ICD-10-CM | POA: Diagnosis present

## 2019-09-08 LAB — CBC
HCT: 39.3 % (ref 36.0–46.0)
Hemoglobin: 12.6 g/dL (ref 12.0–15.0)
MCH: 27.9 pg (ref 26.0–34.0)
MCHC: 32.1 g/dL (ref 30.0–36.0)
MCV: 87.1 fL (ref 80.0–100.0)
Platelets: 303 10*3/uL (ref 150–400)
RBC: 4.51 MIL/uL (ref 3.87–5.11)
RDW: 15.2 % (ref 11.5–15.5)
WBC: 9.3 10*3/uL (ref 4.0–10.5)
nRBC: 0 % (ref 0.0–0.2)

## 2019-09-08 LAB — BASIC METABOLIC PANEL
Anion gap: 9 (ref 5–15)
BUN: 11 mg/dL (ref 8–23)
CO2: 26 mmol/L (ref 22–32)
Calcium: 9.9 mg/dL (ref 8.9–10.3)
Chloride: 104 mmol/L (ref 98–111)
Creatinine, Ser: 0.63 mg/dL (ref 0.44–1.00)
GFR calc Af Amer: >60 mL/min (ref >60)
GFR calc non Af Amer: >60 mL/min (ref >60)
Glucose, Bld: 89 mg/dL (ref 70–99)
Potassium: 4.2 mmol/L (ref 3.5–5.1)
Sodium: 139 mmol/L (ref 135–145)

## 2019-09-08 MED ORDER — DEXAMETHASONE 4 MG PO TABS: 4.0000 mg | ORAL_TABLET | Freq: Two times a day (BID) | ORAL | 0 refills | Status: DC

## 2019-09-08 MED ORDER — DEXAMETHASONE 4 MG PO TABS
8.0000 mg | ORAL_TABLET | Freq: Once | ORAL | Status: AC
Start: 2019-09-08 — End: 2019-09-08
  Administered 2019-09-08: 8 mg via ORAL
  Filled 2019-09-08: qty 2

## 2019-09-08 MED ORDER — GABAPENTIN 600 MG PO TABS
300.0000 mg | ORAL_TABLET | Freq: Once | ORAL | Status: AC
  Administered 2019-09-08: 20:00:00 300 mg via ORAL
  Filled 2019-09-08: qty 0.5

## 2019-09-08 MED ORDER — GABAPENTIN 100 MG PO CAPS
200.0000 mg | ORAL_CAPSULE | Freq: Three times a day (TID) | ORAL | 0 refills | Status: DC | PRN
Start: 2019-09-08 — End: 2020-05-03

## 2019-09-08 NOTE — ED Notes (Signed)
Patient verbalizes understanding of discharge instructions. Opportunity for questioning and answers were provided. Armband removed by staff, pt discharged from ED ambulatory.   

## 2019-09-08 NOTE — ED Triage Notes (Signed)
Pt here for eval of right sided headache since Saturday with some nausea. No problems with vision balance or speech. Pt states she thinks she has had a migraine in the past.

## 2019-09-11 NOTE — ED Provider Notes (Signed)
[facility] EMERGENCY DEPARTMENT Provider Note   CSN: [id number] Arrival date & time: 09/08/19  1101     History   Chief Complaint Chief Complaint  Patient presents with  . Headache  . Nausea    HPI Teresa Keller is a 78 y.o. female.     HPI   78 year old female with headache.  Onset Saturday.  Intermittent since then.  Pain is in the right posterior scalp and extends towards the temporal region.  Very brief, sharp pain.  Last seconds.  No appreciable exacerbating relieving factors.  She has noticed that her scalp is tender to touch.  No change in visual acuity.  No diplopia.  No eye pain.  No eye tearing or rhinorrhea.  No rash.  Past Medical History:  Diagnosis Date  . Arthritis   . Common bile duct stone   . Diverticulitis     Patient Active Problem List   Diagnosis Date Noted  . Abdominal pain, epigastric 11/07/2014  . Preventive measure 01/01/2014  . Other screening mammogram 01/01/2014  . Chest pain 12/25/2013  . Choledocholithiasis 08/08/2013  . Uncontrolled hypertension, stage 1 08/26/2012  . Prediabetes 08/26/2012  . Anemia 08/26/2012  . 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 CHOLECYSTECTOMY  october 2012  . ERCP  08/22/2012   Procedure: ENDOSCOPIC RETROGRADE CHOLANGIOPANCREATOGRAPHY (ERCP);  Surgeon: Inda Castle, MD;  Location: [facility];  Service: Endoscopy;  Laterality: N/A;  . ERCP N/A 07/31/2013   Procedure: ENDOSCOPIC RETROGRADE CHOLANGIOPANCREATOGRAPHY (ERCP);  Surgeon: Irene Shipper, MD;  Location: [facility] ENDOSCOPY;  Service: Endoscopy;  Laterality: N/A;  . ESOPHAGOGASTRODUODENOSCOPY  04/25/13   Surveillance at [facility] - Normal appearing ampulla; No residual adenomatous tissue seen  . EUS  11/21/2012   Procedure: UPPER ENDOSCOPIC ULTRASOUND (EUS) LINEAR;  Surgeon: Milus Banister, MD;  Location: [facility] ENDOSCOPY;  Service: Endoscopy;  Laterality: N/A;  . TUBAL LIGATION    .  VENTRAL HERNIA REPAIR  08/2011   s/p chole, at trochar site, by Dr. Lucia Gaskins     OB History   No obstetric history on file.      Home Medications    Prior to Admission medications   Medication Sig Start Date End Date Taking? Authorizing Provider  aspirin-acetaminophen-caffeine (EXCEDRIN MIGRAINE) [phone] MG tablet Take 2 tablets by mouth every 6 (six) hours as needed for headache or migraine.   Yes [provider]  Calcium Carbonate-Vitamin D (CALCIUM-VITAMIN D) 500-200 MG-UNIT per tablet Take 1 tablet by mouth 2 (two) times daily with a meal.   Yes [provider]  fish oil-omega-3 fatty acids 1000 MG capsule Take 1 g by mouth 3 (three) times daily.   Yes [provider]  ibuprofen (ADVIL,MOTRIN) 200 MG tablet Take 400 mg by mouth every 6 (six) hours as needed for moderate pain.   Yes [provider]  Multiple Vitamin (MULTIVITAMIN WITH MINERALS) TABS Take 1 tablet by mouth daily.   Yes [provider]  dexamethasone (DECADRON) 4 MG tablet Take 1 tablet (4 mg total) by mouth 2 (two) times daily with a meal. 09/08/19   Virgel Manifold, MD  gabapentin (NEURONTIN) 100 MG capsule Take 2 capsules (200 mg total) by mouth 3 (three) times daily as needed. 09/08/19   Virgel Manifold, MD    Family History Family History  Problem Relation Age of Onset  . Hypertension Mother   . Colon cancer Mother 16  . Hypertension  Sister     Social History Social History   Tobacco Use  . Smoking status: Never Smoker  . Smokeless tobacco: Never Used  Substance Use Topics  . Alcohol use: No  . Drug use: No     Allergies   Codeine and Sulfur   Review of Systems Review of Systems  All systems reviewed and negative, other than as noted in HPI. Physical Exam Updated Vital Signs BP 114/70   Pulse 65   Temp 98.6 F (37 C)   Resp 16   SpO2 98%   Physical Exam Vitals signs and nursing note reviewed.  Constitutional:      General: She is not in  acute distress.    Appearance: She is well-developed.  HENT:     Head: Normocephalic and atraumatic.  Eyes:     General:        Right eye: No discharge.        Left eye: No discharge.     Conjunctiva/sclera: Conjunctivae normal.  Neck:     Musculoskeletal: Neck supple.  Cardiovascular:     Rate and Rhythm: Normal rate and regular rhythm.     Heart sounds: Normal heart sounds. No murmur. No friction rub. No gallop.   Pulmonary:     Effort: Pulmonary effort is normal. No respiratory distress.     Breath sounds: Normal breath sounds.  Abdominal:     General: There is no distension.     Palpations: Abdomen is soft.     Tenderness: There is no abdominal tenderness.  Musculoskeletal:        General: No tenderness.  Skin:    General: Skin is warm and dry.  Neurological:     Mental Status: She is alert and oriented to person, place, and time.     Cranial Nerves: No cranial nerve deficit.     Sensory: No sensory deficit.     Motor: No weakness.     Coordination: Coordination normal.     Comments: Speech clear..  Content appropriate.  Following commands.  Cranial nerves II through XII intact.  Strength out of 5 bilateral upper and lower extremities.  Good finger-nose testing bilaterally.  Scalp is normal in appearance the best I can tell.  She does have some tenderness to palpation over the high right parietal region extending into the temporoparietal region.  Psychiatric:        Behavior: Behavior normal.        Thought Content: Thought content normal.      ED Treatments / Results  Labs (all labs ordered are listed, but only abnormal results are displayed) Labs Reviewed  CBC  BASIC METABOLIC PANEL    EKG None  Radiology No results found.  Procedures Procedures (including critical care time)  Medications Ordered in ED Medications  dexamethasone (DECADRON) tablet 8 mg (8 mg Oral Given 09/08/19 1932)  gabapentin (NEURONTIN) tablet 300 mg (300 mg Oral Given 09/08/19  1932)     Initial Impression / Assessment and Plan / ED Course  I have reviewed the triage vital signs and the nursing notes.  Pertinent labs & imaging results that were available during my care of the patient were reviewed by me and considered in my medical decision making (see chart for details).       78 year old female with headache/scalp pain.  Sounds very much neuropathic in nature.  Occipital neuralgia?  Symptoms do not seem quite as posterior as I would expect for this though.  Regardless, I doubt  emergent process such as bleed, mass, meningitis, giant cell lateral right is, carbon monoxide poisoning, etc.  Plan symptomatic treatment.  Return precautions were discussed.  Outpatient follow-up with her PCP or neurology otherwise.  Final Clinical Impressions(s) / ED Diagnoses   Final diagnoses:  Neuralgia involving scalp    ED Discharge Orders         Ordered    dexamethasone (DECADRON) 4 MG tablet  2 times daily with meals     09/08/19 1915    gabapentin (NEURONTIN) 100 MG capsule  3 times daily PRN     09/08/19 1915           Virgel Manifold, MD 09/11/19 1131

## 2019-10-14 ENCOUNTER — Encounter (HOSPITAL_COMMUNITY): Payer: Self-pay

## 2019-10-14 ENCOUNTER — Emergency Department (HOSPITAL_COMMUNITY)
Admission: EM | Admit: 2019-10-14 | Discharge: 2019-10-14 | Disposition: A | Discharge status: Home / Self Care | Payer: BC Managed Care – PPO | Attending: Emergency Medicine | Admitting: Emergency Medicine

## 2019-10-14 ENCOUNTER — Telehealth: Payer: Self-pay | Admitting: Surgery

## 2019-10-14 ENCOUNTER — Other Ambulatory Visit: Payer: Self-pay

## 2019-10-14 ENCOUNTER — Emergency Department (HOSPITAL_COMMUNITY): Payer: BC Managed Care – PPO

## 2019-10-14 DIAGNOSIS — M25551 Pain in right hip: Secondary | ICD-10-CM

## 2019-10-14 DIAGNOSIS — Z79899 Other long term (current) drug therapy: Secondary | ICD-10-CM | POA: Insufficient documentation

## 2019-10-14 DIAGNOSIS — I10 Essential (primary) hypertension: Secondary | ICD-10-CM | POA: Insufficient documentation

## 2019-10-14 MED ORDER — HYDROCODONE-ACETAMINOPHEN 5-325 MG PO TABS: 1.0000 | ORAL_TABLET | ORAL | 0 refills | Status: DC | PRN

## 2019-10-14 MED ORDER — ONDANSETRON 8 MG PO TBDP
8.0000 mg | ORAL_TABLET | Freq: Once | ORAL | Status: AC
  Administered 2019-10-14: 8 mg via ORAL
  Filled 2019-10-14: qty 1

## 2019-10-14 MED ORDER — PREDNISONE 50 MG PO TABS: 50.0000 mg | ORAL_TABLET | Freq: Every day | ORAL | 0 refills | Status: DC

## 2019-10-14 MED ORDER — HYDROCODONE-ACETAMINOPHEN 5-325 MG PO TABS
1.0000 | ORAL_TABLET | Freq: Once | ORAL | Status: AC
  Administered 2019-10-14: 1 via ORAL
  Filled 2019-10-14: qty 1

## 2019-10-14 NOTE — ED Triage Notes (Signed)
Patient c/o right hip pain x 2 days. Patient denies any fall or other injury.

## 2019-10-14 NOTE — ED Provider Notes (Signed)
[facility] DEPT Provider Note   CSN: [id number] Arrival date & time: 10/14/19  0750     History   Chief Complaint Chief Complaint  Patient presents with  . Hip Pain    HPI Teresa Keller is a 78 y.o. female.     78 year old female presents with right-sided hip pain times several days.  No history of trauma.  Pain is sharp and worse with any standing.  Pain starts on the right lateral hip and then goes down the mid lateral thigh.  Denies any back pain.  No weakness or foot drop on the right side.  No prior history of same.  Does have history of arthritis.  Has using Tylenol with limited relief     Past Medical History:  Diagnosis Date  . Arthritis   . Common bile duct stone   . Diverticulitis     Patient Active Problem List   Diagnosis Date Noted  . Abdominal pain, epigastric 11/07/2014  . Preventive measure 01/01/2014  . Other screening mammogram 01/01/2014  . Chest pain 12/25/2013  . Choledocholithiasis 08/08/2013  . Uncontrolled hypertension, stage 1 08/26/2012  . Prediabetes 08/26/2012  . Anemia 08/26/2012  . 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 CHOLECYSTECTOMY  october 2012  . ERCP  08/22/2012   Procedure: ENDOSCOPIC RETROGRADE CHOLANGIOPANCREATOGRAPHY (ERCP);  Surgeon: Inda Castle, MD;  Location: [facility];  Service: Endoscopy;  Laterality: N/A;  . ERCP N/A 07/31/2013   Procedure: ENDOSCOPIC RETROGRADE CHOLANGIOPANCREATOGRAPHY (ERCP);  Surgeon: Irene Shipper, MD;  Location: [facility] ENDOSCOPY;  Service: Endoscopy;  Laterality: N/A;  . ESOPHAGOGASTRODUODENOSCOPY  04/25/13   Surveillance at [facility] - Normal appearing ampulla; No residual adenomatous tissue seen  . EUS  11/21/2012   Procedure: UPPER ENDOSCOPIC ULTRASOUND (EUS) LINEAR;  Surgeon: Milus Banister, MD;  Location: [facility] ENDOSCOPY;  Service: Endoscopy;  Laterality: N/A;  . HERNIA REPAIR    . TUBAL LIGATION    .  VENTRAL HERNIA REPAIR  08/2011   s/p chole, at trochar site, by Dr. Lucia Gaskins     OB History   No obstetric history on file.      Home Medications    Prior to Admission medications   Medication Sig Start Date End Date Taking? Authorizing Provider  aspirin-acetaminophen-caffeine (EXCEDRIN MIGRAINE) [phone] MG tablet Take 2 tablets by mouth every 6 (six) hours as needed for headache or migraine.    [provider]  Calcium Carbonate-Vitamin D (CALCIUM-VITAMIN D) 500-200 MG-UNIT per tablet Take 1 tablet by mouth 2 (two) times daily with a meal.    [provider]  dexamethasone (DECADRON) 4 MG tablet Take 1 tablet (4 mg total) by mouth 2 (two) times daily with a meal. 09/08/19   Virgel Manifold, MD  fish oil-omega-3 fatty acids 1000 MG capsule Take 1 g by mouth 3 (three) times daily.    [provider]  gabapentin (NEURONTIN) 100 MG capsule Take 2 capsules (200 mg total) by mouth 3 (three) times daily as needed. 09/08/19   Virgel Manifold, MD  ibuprofen (ADVIL,MOTRIN) 200 MG tablet Take 400 mg by mouth every 6 (six) hours as needed for moderate pain.    [provider]  Multiple Vitamin (MULTIVITAMIN WITH MINERALS) TABS Take 1 tablet by mouth daily.    [provider]    Family History Family History  Problem Relation Age of Onset  . Hypertension Mother   . Colon cancer  Mother 108  . Hypertension Sister     Social History Social History   Tobacco Use  . Smoking status: Never Smoker  . Smokeless tobacco: Never Used  Substance Use Topics  . Alcohol use: No  . Drug use: No     Allergies   Codeine and Sulfur   Review of Systems Review of Systems  All other systems reviewed and are negative.    Physical Exam Updated Vital Signs BP (!) 146/109 (BP Location: Left Arm)   Pulse 78   Temp 98.2 F (36.8 C) (Oral)   Resp 16   Ht 1.549 m (5\' 1" )   Wt 71.2 kg   SpO2 97%   BMI 29.66 kg/m   Physical Exam Vitals signs and  nursing note reviewed.  Constitutional:      General: She is not in acute distress.    Appearance: Normal appearance. She is well-developed. She is not toxic-appearing.  HENT:     Head: Normocephalic and atraumatic.  Eyes:     General: Lids are normal.     Conjunctiva/sclera: Conjunctivae normal.     Pupils: Pupils are equal, round, and reactive to light.  Neck:     Musculoskeletal: Normal range of motion and neck supple.     Thyroid: No thyroid mass.     Trachea: No tracheal deviation.  Cardiovascular:     Rate and Rhythm: Normal rate and regular rhythm.     Heart sounds: Normal heart sounds. No murmur. No gallop.   Pulmonary:     Effort: Pulmonary effort is normal. No respiratory distress.     Breath sounds: Normal breath sounds. No stridor. No decreased breath sounds, wheezing, rhonchi or rales.  Abdominal:     General: Bowel sounds are normal. There is no distension.     Palpations: Abdomen is soft.     Tenderness: There is no abdominal tenderness. There is no rebound.  Musculoskeletal: Normal range of motion.        General: No tenderness.       Legs:     Comments: Pt neurovascular intact at foot  Skin:    General: Skin is warm and dry.     Findings: No abrasion or rash.  Neurological:     Mental Status: She is alert and oriented to person, place, and time.     GCS: GCS eye subscore is 4. GCS verbal subscore is 5. GCS motor subscore is 6.     Cranial Nerves: No cranial nerve deficit.     Sensory: No sensory deficit.  Psychiatric:        Speech: Speech normal.        Behavior: Behavior normal.      ED Treatments / Results  Labs (all labs ordered are listed, but only abnormal results are displayed) Labs Reviewed - No data to display  EKG None  Radiology No results found.  Procedures Procedures (including critical care time)  Medications Ordered in ED Medications  ondansetron (ZOFRAN-ODT) disintegrating tablet 8 mg (has no administration in time range)   HYDROcodone-acetaminophen (NORCO/VICODIN) 5-325 MG per tablet 1 tablet (has no administration in time range)     Initial Impression / Assessment and Plan / ED Course  I have reviewed the triage vital signs and the nursing notes.  Pertinent labs & imaging results that were available during my care of the patient were reviewed by me and considered in my medical decision making (see chart for details).       Patient's  hip x-ray negative for fracture.  Medicated for pain feels better.  Will discharge home.  Final Clinical Impressions(s) / ED Diagnoses   Final diagnoses:  None    ED Discharge Orders    None       Lacretia Leigh, MD 10/14/19 1027

## 2019-10-14 NOTE — Telephone Encounter (Signed)
ED CM received call from [facility] for clarification of ED discharge prescription, clarification provided.

## 2020-05-03 ENCOUNTER — Encounter (HOSPITAL_COMMUNITY): Payer: Self-pay | Admitting: Emergency Medicine

## 2020-05-03 ENCOUNTER — Ambulatory Visit (HOSPITAL_COMMUNITY)
Admission: EM | Admit: 2020-05-03 | Discharge: 2020-05-03 | Disposition: A | Discharge status: Home / Self Care | Payer: BC Managed Care – PPO | Attending: Family Medicine | Admitting: Family Medicine

## 2020-05-03 ENCOUNTER — Other Ambulatory Visit: Payer: Self-pay

## 2020-05-03 DIAGNOSIS — M25551 Pain in right hip: Secondary | ICD-10-CM

## 2020-05-03 MED ORDER — PREDNISONE 10 MG (21) PO TBPK: ORAL_TABLET | ORAL | 0 refills | Status: DC

## 2020-05-03 MED ORDER — HYDROCODONE-ACETAMINOPHEN 5-325 MG PO TABS: 1.0000 | ORAL_TABLET | Freq: Four times a day (QID) | ORAL | 0 refills | Status: DC | PRN

## 2020-05-03 NOTE — Discharge Instructions (Addendum)
Treating for arthritis of the hip.  Take the medication as prescribed. Recommend follow-up with sports medicine for any continued issues

## 2020-05-03 NOTE — ED Triage Notes (Signed)
PT reports right hip pain for 1 week. She has used muscle relaxers provided by her doctor with no relief. Reports known arthritis in right hip. No injury.

## 2020-05-05 NOTE — ED Provider Notes (Signed)
[name]    CSN: [id number] Arrival date & time: 05/03/20  1287      History   Chief Complaint Chief Complaint  Patient presents with  . Hip Pain    HPI Teresa Keller is a 79 y.o. female.   Patient is a 79 year old female presents today with right hip pain x1 week.  Symptoms have been constant.  She has been using some muscle relaxants that were provided by her doctor with no relief.  Does have known arthritis in the right hip.  Denies any specific falls or injuries.  Denies any radiation of pain, numbness or tingling. She is a Sports coach. Known arthritis of the hip.   ROS per HPI      Past Medical History:  Diagnosis Date  . Arthritis   . Common bile duct stone   . Diverticulitis     Patient Active Problem List   Diagnosis Date Noted  . Abdominal pain, epigastric 11/07/2014  . Preventive measure 01/01/2014  . Other screening mammogram 01/01/2014  . Chest pain 12/25/2013  . Choledocholithiasis 08/08/2013  . Uncontrolled hypertension, stage 1 08/26/2012  . Prediabetes 08/26/2012  . Anemia 08/26/2012  . 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 CHOLECYSTECTOMY  october 2012  . ERCP  08/22/2012   Procedure: ENDOSCOPIC RETROGRADE CHOLANGIOPANCREATOGRAPHY (ERCP);  Surgeon: Inda Castle, MD;  Location: [facility];  Service: Endoscopy;  Laterality: N/A;  . ERCP N/A 07/31/2013   Procedure: ENDOSCOPIC RETROGRADE CHOLANGIOPANCREATOGRAPHY (ERCP);  Surgeon: Irene Shipper, MD;  Location: [facility] ENDOSCOPY;  Service: Endoscopy;  Laterality: N/A;  . ESOPHAGOGASTRODUODENOSCOPY  04/25/13   Surveillance at [facility] - Normal appearing ampulla; No residual adenomatous tissue seen  . EUS  11/21/2012   Procedure: UPPER ENDOSCOPIC ULTRASOUND (EUS) LINEAR;  Surgeon: Milus Banister, MD;  Location: [facility] ENDOSCOPY;  Service: Endoscopy;  Laterality: N/A;  . HERNIA REPAIR    . TUBAL LIGATION    . VENTRAL HERNIA REPAIR  08/2011     s/p chole, at trochar site, by Dr. Lucia Gaskins    OB History   No obstetric history on file.      Home Medications    Prior to Admission medications   Medication Sig Start Date End Date Taking? Authorizing Provider  tiZANidine (ZANAFLEX) 2 MG tablet Take 2 mg by mouth 2 (two) times daily as needed for muscle spasms. 09/22/19  Yes [provider]  HYDROcodone-acetaminophen (NORCO/VICODIN) 5-325 MG tablet Take 1-2 tablets by mouth every 6 (six) hours as needed. 05/03/20   Loura Halt A, NP  ibuprofen (ADVIL,MOTRIN) 200 MG tablet Take 400 mg by mouth every 6 (six) hours as needed for moderate pain.    [provider]  Menthol, Topical Analgesic, (BIOFREEZE EX) Apply 1 application topically as needed (hip pain).    [provider]  predniSONE (STERAPRED UNI-PAK 21 TAB) 10 MG (21) TBPK tablet 6 tabs for 1 day, then 5 tabs for 1 das, then 4 tabs for 1 day, then 3 tabs for 1 day, 2 tabs for 1 day, then 1 tab for 1 day 05/03/20   Loura Halt A, NP  gabapentin (NEURONTIN) 100 MG capsule Take 2 capsules (200 mg total) by mouth 3 (three) times daily as needed. Patient not taking: Reported on 10/14/2019 09/08/19 05/03/20  Virgel Manifold, MD    Family History Family History  Problem Relation Age of Onset  . Hypertension Mother   . Colon cancer  Mother 35  . Hypertension Sister     Social History Social History   Tobacco Use  . Smoking status: Never Smoker  . Smokeless tobacco: Never Used  Vaping Use  . Vaping Use: Never used  Substance Use Topics  . Alcohol use: No  . Drug use: No     Allergies   Codeine and Sulfur   Review of Systems Review of Systems   Physical Exam Triage Vital Signs ED Triage Vitals  Enc Vitals Group     BP 05/03/20 0823 (!) 166/88     Pulse Rate 05/03/20 0823 87     Resp 05/03/20 0823 16     Temp 05/03/20 0823 98.4 F (36.9 C)     Temp Source 05/03/20 0823 Oral     SpO2 05/03/20 0823 98 %     Weight --      Height --       Head Circumference --      Peak Flow --      Pain Score 05/03/20 0831 8     Pain Loc --      Pain Edu? --      Excl. in [place]? --    No data found.  Updated Vital Signs BP (!) 166/88 (BP Location: Left Arm)   Pulse 87   Temp 98.4 F (36.9 C) (Oral)   Resp 16   SpO2 98%   Visual Acuity Right Eye Distance:   Left Eye Distance:   Bilateral Distance:    Right Eye Near:   Left Eye Near:    Bilateral Near:     Physical Exam Vitals and nursing note reviewed.  Constitutional:      General: She is not in acute distress.    Appearance: Normal appearance. She is not ill-appearing, toxic-appearing or diaphoretic.  HENT:     Head: Normocephalic.     Nose: Nose normal.     Mouth/Throat:     Pharynx: Oropharynx is clear.  Eyes:     Conjunctiva/sclera: Conjunctivae normal.  Pulmonary:     Effort: Pulmonary effort is normal.  Musculoskeletal:     Cervical back: Normal range of motion.     Right hip: Tenderness and bony tenderness present. No crepitus. Decreased range of motion.  Skin:    General: Skin is warm and dry.     Findings: No rash.  Neurological:     Mental Status: She is alert.  Psychiatric:        Mood and Affect: Mood normal.      UC Treatments / Results  Labs (all labs ordered are listed, but only abnormal results are displayed) Labs Reviewed - No data to display  EKG   Radiology No results found.  Procedures Procedures (including critical care time)  Medications Ordered in UC Medications - No data to display  Initial Impression / Assessment and Plan / UC Course  I have reviewed the triage vital signs and the nursing notes.  Pertinent labs & imaging results that were available during my care of the patient were reviewed by me and considered in my medical decision making (see chart for details).     Right hip pain Treating arthritis of the hip Prednisone taper over the next 6 days along with hydrocodone as needed for severe pain. Recommended  follow-up with sports medicine for any continued issues Final Clinical Impressions(s) / UC Diagnoses   Final diagnoses:  Right hip pain     Discharge Instructions     Treating  for arthritis of the hip.  Take the medication as prescribed. Recommend follow-up with sports medicine for any continued issues    ED Prescriptions    Medication Sig Dispense Auth. Provider   HYDROcodone-acetaminophen (NORCO/VICODIN) 5-325 MG tablet Take 1-2 tablets by mouth every 6 (six) hours as needed. 10 tablet Raychel Dowler A, NP   predniSONE (STERAPRED UNI-PAK 21 TAB) 10 MG (21) TBPK tablet 6 tabs for 1 day, then 5 tabs for 1 das, then 4 tabs for 1 day, then 3 tabs for 1 day, 2 tabs for 1 day, then 1 tab for 1 day 21 tablet Karina Lenderman A, NP     I have reviewed the PDMP during this encounter.   Orvan July, NP 05/05/20 1753

## 2020-05-14 ENCOUNTER — Ambulatory Visit (HOSPITAL_COMMUNITY)
Admission: EM | Admit: 2020-05-14 | Discharge: 2020-05-14 | Disposition: A | Discharge status: Home / Self Care | Payer: BC Managed Care – PPO | Attending: Nurse Practitioner | Admitting: Nurse Practitioner

## 2020-05-14 ENCOUNTER — Other Ambulatory Visit: Payer: Self-pay

## 2020-05-14 ENCOUNTER — Ambulatory Visit (INDEPENDENT_AMBULATORY_CARE_PROVIDER_SITE_OTHER): Payer: BC Managed Care – PPO

## 2020-05-14 DIAGNOSIS — N3000 Acute cystitis without hematuria: Secondary | ICD-10-CM | POA: Insufficient documentation

## 2020-05-14 DIAGNOSIS — N390 Urinary tract infection, site not specified: Secondary | ICD-10-CM

## 2020-05-14 DIAGNOSIS — R079 Chest pain, unspecified: Secondary | ICD-10-CM | POA: Diagnosis not present

## 2020-05-14 DIAGNOSIS — R0789 Other chest pain: Secondary | ICD-10-CM | POA: Insufficient documentation

## 2020-05-14 DIAGNOSIS — R0602 Shortness of breath: Secondary | ICD-10-CM | POA: Diagnosis not present

## 2020-05-14 DIAGNOSIS — I10 Essential (primary) hypertension: Secondary | ICD-10-CM | POA: Diagnosis not present

## 2020-05-14 LAB — POCT URINALYSIS DIP (DEVICE)
Bilirubin Urine: NEGATIVE
Glucose, UA: NEGATIVE mg/dL
Hgb urine dipstick: NEGATIVE
Ketones, ur: NEGATIVE mg/dL
Nitrite: NEGATIVE
Protein, ur: NEGATIVE mg/dL
Specific Gravity, Urine: 1.025 (ref 1.005–1.030)
Urobilinogen, UA: 0.2 mg/dL (ref 0.0–1.0)
pH: 7 (ref 5.0–8.0)

## 2020-05-14 MED ORDER — CIPROFLOXACIN HCL 500 MG PO TABS: 500.0000 mg | ORAL_TABLET | Freq: Two times a day (BID) | ORAL | 0 refills | Status: DC

## 2020-05-14 MED ORDER — ALBUTEROL SULFATE HFA 108 (90 BASE) MCG/ACT IN AERS: 1.0000 | INHALATION_SPRAY | Freq: Four times a day (QID) | RESPIRATORY_TRACT | 0 refills | Status: AC | PRN

## 2020-05-14 MED ORDER — LISINOPRIL-HYDROCHLOROTHIAZIDE 10-12.5 MG PO TABS: 1.0000 | ORAL_TABLET | Freq: Every day | ORAL | 0 refills | Status: DC

## 2020-05-14 NOTE — Discharge Instructions (Signed)
Take medications as prescribed. Watch your salt intake. Drink plenty of fluids.

## 2020-05-14 NOTE — ED Triage Notes (Signed)
Patient thinks she has a uti.  This is why she came to [facility].  While here states left chest is hurting, no nausea, no diaphoresis.  Patient is sob

## 2020-05-14 NOTE — ED Provider Notes (Signed)
[place]    CSN: [id number] Arrival date & time: 05/14/20  0813      History   Chief Complaint Chief Complaint  Patient presents with  . Urinary Tract Infection  . Chest Pain    HPI Teresa Keller is a 79 y.o. female.   Subjective:  Teresa Keller is a 79 y.o. female who denies any past medical history and does not take any medications on a daily basis that presents with a 1 week history of who complains of frequency and suprapubic pressure. Patient denies vaginal discharge, odor or dysuria.  Patient does not have a history of recurrent UTI.  Patient does not have a history of pyelonephritis.  Teresa Keller also complains of chest tightness. Onset was 1 week ago. Symptoms have waxed and waned since that time. The patient describes the pain as tightness and it does not radiate. Patient rates pain as a 5/10 in intensity. Associated symptoms are: dyspnea and wheezing. Aggravating factors are: none. Alleviating factors are: antacids and albuterol inhaler.  Patient denies any visual disturbances, nausea, vomiting, cough, nausea, vomiting, headache or dizziness.  Patient's cardiac risk factors are: advanced age (older than 52 for men, 69 for women) and hypertension (pt has history of HTN per medical records). Patient's risk factors for DVT/PE: none. Previous cardiac testing: none.  The following portions of the patient's history were reviewed and updated as appropriate: allergies, current medications, past family history, past medical history, past social history, past surgical history and problem list.         Past Medical History:  Diagnosis Date  . Arthritis   . Common bile duct stone   . Diverticulitis     Patient Active Problem List   Diagnosis Date Noted  . Abdominal pain, epigastric 11/07/2014  . Preventive measure 01/01/2014  . Other screening mammogram 01/01/2014  . Chest pain 12/25/2013  . Choledocholithiasis 08/08/2013  . Uncontrolled hypertension,  stage 1 08/26/2012  . Prediabetes 08/26/2012  . Anemia 08/26/2012  . 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 CHOLECYSTECTOMY  october 2012  . ERCP  08/22/2012   Procedure: ENDOSCOPIC RETROGRADE CHOLANGIOPANCREATOGRAPHY (ERCP);  Surgeon: Inda Castle, MD;  Location: [place];  Service: Endoscopy;  Laterality: N/A;  . ERCP N/A 07/31/2013   Procedure: ENDOSCOPIC RETROGRADE CHOLANGIOPANCREATOGRAPHY (ERCP);  Surgeon: Irene Shipper, MD;  Location: [facility] ENDOSCOPY;  Service: Endoscopy;  Laterality: N/A;  . ESOPHAGOGASTRODUODENOSCOPY  04/25/13   Surveillance at [facility] - Normal appearing ampulla; No residual adenomatous tissue seen  . EUS  11/21/2012   Procedure: UPPER ENDOSCOPIC ULTRASOUND (EUS) LINEAR;  Surgeon: Milus Banister, MD;  Location: [facility] ENDOSCOPY;  Service: Endoscopy;  Laterality: N/A;  . HERNIA REPAIR    . TUBAL LIGATION    . VENTRAL HERNIA REPAIR  08/2011   s/p chole, at trochar site, by Dr. Lucia Gaskins    OB History   No obstetric history on file.      Home Medications    Prior to Admission medications   Medication Sig Start Date End Date Taking? Authorizing Provider  albuterol (VENTOLIN HFA) 108 (90 Base) MCG/ACT inhaler Inhale 1-2 puffs into the lungs every 6 (six) hours as needed for wheezing or shortness of breath. 05/14/20   Enrique Sack, FNP  ciprofloxacin (CIPRO) 500 MG tablet Take 1 tablet (500 mg total) by mouth 2 (two) times daily. 05/14/20   Enrique Sack, FNP  ibuprofen (ADVIL,MOTRIN) 200 MG  tablet Take 400 mg by mouth every 6 (six) hours as needed for moderate pain.    [provider]  lisinopril-hydrochlorothiazide (ZESTORETIC) 10-12.5 MG tablet Take 1 tablet by mouth daily. 05/14/20 06/13/20  Enrique Sack, FNP  gabapentin (NEURONTIN) 100 MG capsule Take 2 capsules (200 mg total) by mouth 3 (three) times daily as needed. Patient not taking: Reported on 10/14/2019 09/08/19 05/03/20  Virgel Manifold, MD    Family History Family History  Problem Relation Age of Onset  . Hypertension Mother   . Colon cancer Mother 35  . Hypertension Sister     Social History Social History   Tobacco Use  . Smoking status: Never Smoker  . Smokeless tobacco: Never Used  Vaping Use  . Vaping Use: Never used  Substance Use Topics  . Alcohol use: No  . Drug use: No     Allergies   Codeine and Sulfur   Review of Systems Review of Systems  Constitutional: Negative.   HENT: Negative.   Respiratory: Positive for shortness of breath and wheezing.   Cardiovascular: Positive for chest pain. Negative for palpitations and leg swelling.  Gastrointestinal: Negative.   Genitourinary: Positive for frequency. Negative for dysuria, flank pain and vaginal discharge.  Musculoskeletal: Negative for back pain.  Skin: Negative.   Neurological: Negative.   All other systems reviewed and are negative.    Physical Exam Triage Vital Signs ED Triage Vitals  Enc Vitals Group     BP 05/14/20 0845 (!) 182/83     Pulse Rate 05/14/20 0845 80     Resp 05/14/20 0845 (!) 25     Temp 05/14/20 0845 98.7 F (37.1 C)     Temp Source 05/14/20 0845 Oral     SpO2 05/14/20 0845 99 %     Weight --      Height --      Head Circumference --      Peak Flow --      Pain Score 05/14/20 0847 8     Pain Loc --      Pain Edu? --      Excl. in [place]? --    No data found.  Updated Vital Signs BP (!) 182/83 (BP Location: Right Arm)   Pulse 80   Temp 98.7 F (37.1 C) (Oral)   Resp (!) 25   SpO2 99%   Visual Acuity Right Eye Distance:   Left Eye Distance:   Bilateral Distance:    Right Eye Near:   Left Eye Near:    Bilateral Near:     Physical Exam Vitals reviewed.  Constitutional:      Appearance: She is well-developed.  HENT:     Head: Normocephalic.  Eyes:     Extraocular Movements: Extraocular movements intact.     Pupils: Pupils are equal, round, and reactive to light.  Cardiovascular:      Rate and Rhythm: Normal rate and regular rhythm.     Heart sounds: Normal heart sounds.  Pulmonary:     Effort: Pulmonary effort is normal.     Breath sounds: Normal breath sounds.  Chest:     Chest wall: No tenderness.  Abdominal:     General: Bowel sounds are normal.     Palpations: Abdomen is soft.     Tenderness: There is no abdominal tenderness. There is no right CVA tenderness or left CVA tenderness.  Musculoskeletal:        General: Normal range of motion.  Cervical back: Normal range of motion and neck supple.  Skin:    General: Skin is warm and dry.  Neurological:     General: No focal deficit present.     Mental Status: She is alert.  Psychiatric:        Mood and Affect: Mood normal.        Behavior: Behavior normal.      UC Treatments / Results  Labs (all labs ordered are listed, but only abnormal results are displayed) Labs Reviewed  POCT URINALYSIS DIP (DEVICE) - Abnormal; Notable for the following components:      Result Value   Leukocytes,Ua TRACE (*)    All other components within normal limits  URINE CULTURE    EKG   Date: 05/14/2020  Rate: 85  Rhythm: normal sinus rhythm  QRS Axis: normal  Intervals: normal  ST/T Wave abnormalities: normal  Conduction Disutrbances:none  Narrative Interpretation:   Old EKG Reviewed: none available  I have personally reviewed the EKG tracing and agree with the computerized printout as noted.  Radiology DG Chest 2 View  Result Date: 05/14/2020 CLINICAL DATA:  79 year old female with chest pain since last night, left upper quadrant pain. EXAM: CHEST - 2 VIEW COMPARISON:  Chest radiographs 12/24/2013. FINDINGS: Lung volumes and mediastinal contours remain normal. Mild diffuse increased interstitial markings have developed since 2015. No superimposed pneumothorax, pulmonary edema, pleural effusion or confluent pulmonary opacity. No acute osseous abnormality identified. Chronic scoliosis. Stable cholecystectomy  clips. Negative visible bowel gas pattern. IMPRESSION: Mildly increased interstitial markings throughout both lungs are new since 2015 but might be chronic. Consider also viral/atypical respiratory infection. Otherwise no acute cardiopulmonary abnormality. Electronically Signed   By: Genevie Ann M.D.   On: 05/14/2020 09:55    Procedures Procedures (including critical care time)  Medications Ordered in UC Medications - No data to display  Initial Impression / Assessment and Plan / UC Course  I have reviewed the triage vital signs and the nursing notes.  Pertinent labs & imaging results that were available during my care of the patient were reviewed by me and considered in my medical decision making (see chart for details).    79 yo female with no reported medical history and takes no daily meds presents with a one-week history of urinary frequency, suprapubic pressure, chest tightness, shortness of breath and wheezing. Symptoms relieved with use of a family members albuterol inhaler and antacids. Patient denies history of asthma or lung disease. No history of smoking. No second hand smoke exposure.  Patient denies any recent URI.  EKG completely normal.  Chest x-ray shows some interstitial markings which may be chronic otherwise no acute cardiopulmonary abnormalities.  Urinalysis indicative of an acute UTI.  Patient is hypertensive.  Afebrile.  Physical exam unremarkable.  She has an appointment with her PCP at the end of July.  We will start her on antibiotics for the UTI as well as antihypertensives and an inhaler.  Patient can continue antacids as needed.  Discussed indications for immediate ED follow-up.  Otherwise, follow-up with PCP as scheduled.  Today's evaluation has revealed no signs of a dangerous process. Discussed diagnosis with patient and/or guardian. Patient and/or guardian aware of their diagnosis, possible red flag symptoms to watch out for and need for close follow up. Patient and/or  guardian understands verbal and written discharge instructions. Patient and/or guardian comfortable with plan and disposition.  Patient and/or guardian has a clear mental status at this time, good insight into illness (  after discussion and teaching) and has clear judgment to make decisions regarding their care  This care was provided during an unprecedented National Emergency due to the Novel Coronavirus (COVID-19) pandemic. COVID-19 infections and transmission risks place heavy strains on healthcare resources.  As this pandemic evolves, our facility, providers, and staff strive to respond fluidly, to remain operational, and to provide care relative to available resources and information. Outcomes are unpredictable and treatments are without well-defined guidelines. Further, the impact of COVID-19 on all aspects of urgent care, including the impact to patients seeking care for reasons other than COVID-19, is unavoidable during this national emergency. At this time of the global pandemic, management of patients has significantly changed, even for non-COVID positive patients given high local and regional COVID volumes at this time requiring high healthcare system and resource utilization. The standard of care for management of both COVID suspected and non-COVID suspected patients continues to change rapidly at the local, regional, national, and global levels. This patient was worked up and treated to the best available but ever changing evidence and resources available at this current time.   Documentation was completed with the aid of voice recognition software. Transcription may contain typographical errors.   Final Clinical Impressions(s) / UC Diagnoses   Final diagnoses:  Essential hypertension  Acute cystitis without hematuria  Atypical chest pain     Discharge Instructions     Take medications as prescribed. Watch your salt intake. Drink plenty of fluids.     ED Prescriptions    Medication  Sig Dispense Auth. Provider   lisinopril-hydrochlorothiazide (ZESTORETIC) 10-12.5 MG tablet Take 1 tablet by mouth daily. 30 tablet Enrique Sack, FNP   ciprofloxacin (CIPRO) 500 MG tablet Take 1 tablet (500 mg total) by mouth 2 (two) times daily. 14 tablet Enrique Sack, FNP   albuterol (VENTOLIN HFA) 108 (90 Base) MCG/ACT inhaler Inhale 1-2 puffs into the lungs every 6 (six) hours as needed for wheezing or shortness of breath. 18 g Enrique Sack, FNP     PDMP not reviewed this encounter.   Enrique Sack, [place] 05/14/20 1012

## 2020-05-15 LAB — URINE CULTURE
Culture: 80000 — AB
Special Requests: NORMAL

## 2020-05-18 ENCOUNTER — Emergency Department (HOSPITAL_COMMUNITY): Payer: BC Managed Care – PPO

## 2020-05-18 ENCOUNTER — Encounter (HOSPITAL_COMMUNITY): Payer: Self-pay | Admitting: Emergency Medicine

## 2020-05-18 ENCOUNTER — Observation Stay (HOSPITAL_COMMUNITY)
Admission: EM | Admit: 2020-05-18 | Discharge: 2020-05-21 | Disposition: A | Discharge status: Home / Self Care | Payer: BC Managed Care – PPO | Attending: Internal Medicine | Admitting: Internal Medicine

## 2020-05-18 ENCOUNTER — Other Ambulatory Visit: Payer: Self-pay

## 2020-05-18 DIAGNOSIS — R0789 Other chest pain: Principal | ICD-10-CM | POA: Insufficient documentation

## 2020-05-18 DIAGNOSIS — R7303 Prediabetes: Secondary | ICD-10-CM | POA: Diagnosis not present

## 2020-05-18 DIAGNOSIS — E785 Hyperlipidemia, unspecified: Secondary | ICD-10-CM | POA: Insufficient documentation

## 2020-05-18 DIAGNOSIS — Z20822 Contact with and (suspected) exposure to covid-19: Secondary | ICD-10-CM | POA: Insufficient documentation

## 2020-05-18 DIAGNOSIS — E871 Hypo-osmolality and hyponatremia: Secondary | ICD-10-CM | POA: Insufficient documentation

## 2020-05-18 DIAGNOSIS — I10 Essential (primary) hypertension: Secondary | ICD-10-CM | POA: Diagnosis not present

## 2020-05-18 DIAGNOSIS — R9431 Abnormal electrocardiogram [ECG] [EKG]: Secondary | ICD-10-CM

## 2020-05-18 DIAGNOSIS — R079 Chest pain, unspecified: Secondary | ICD-10-CM | POA: Diagnosis not present

## 2020-05-18 DIAGNOSIS — N390 Urinary tract infection, site not specified: Secondary | ICD-10-CM | POA: Insufficient documentation

## 2020-05-18 HISTORY — DX: Essential (primary) hypertension: I10

## 2020-05-18 HISTORY — DX: Chest pain, unspecified: R07.9

## 2020-05-18 LAB — CBC
HCT: 40.6 % (ref 36.0–46.0)
Hemoglobin: 12.9 g/dL (ref 12.0–15.0)
MCH: 27.3 pg (ref 26.0–34.0)
MCHC: 31.8 g/dL (ref 30.0–36.0)
MCV: 85.8 fL (ref 80.0–100.0)
Platelets: 317 10*3/uL (ref 150–400)
RBC: 4.73 MIL/uL (ref 3.87–5.11)
RDW: 15.4 % (ref 11.5–15.5)
WBC: 11.4 10*3/uL — ABNORMAL HIGH (ref 4.0–10.5)
nRBC: 0 % (ref 0.0–0.2)

## 2020-05-18 LAB — LIPID PANEL
Cholesterol: 201 mg/dL — ABNORMAL HIGH (ref 0–200)
HDL: 46 mg/dL (ref >40)
LDL Cholesterol: 135 mg/dL — ABNORMAL HIGH (ref 0–99)
Total CHOL/HDL Ratio: 4.4 RATIO
Triglycerides: 99 mg/dL (ref <150)
VLDL: 20 mg/dL (ref 0–40)

## 2020-05-18 LAB — BASIC METABOLIC PANEL
Anion gap: 12 (ref 5–15)
BUN: 18 mg/dL (ref 8–23)
CO2: 22 mmol/L (ref 22–32)
Calcium: 9.5 mg/dL (ref 8.9–10.3)
Chloride: 100 mmol/L (ref 98–111)
Creatinine, Ser: 1.05 mg/dL — ABNORMAL HIGH (ref 0.44–1.00)
GFR calc Af Amer: 59 mL/min — ABNORMAL LOW (ref >60)
GFR calc non Af Amer: 51 mL/min — ABNORMAL LOW (ref >60)
Glucose, Bld: 111 mg/dL — ABNORMAL HIGH (ref 70–99)
Potassium: 3.9 mmol/L (ref 3.5–5.1)
Sodium: 134 mmol/L — ABNORMAL LOW (ref 135–145)

## 2020-05-18 LAB — SARS CORONAVIRUS 2 BY RT PCR (HOSPITAL ORDER, PERFORMED IN ~~LOC~~ HOSPITAL LAB): SARS Coronavirus 2: NEGATIVE

## 2020-05-18 LAB — TROPONIN I (HIGH SENSITIVITY)
Troponin I (High Sensitivity): 3 ng/L (ref <18)
Troponin I (High Sensitivity): 3 ng/L (ref <18)
Troponin I (High Sensitivity): 4 ng/L (ref <18)

## 2020-05-18 LAB — D-DIMER, QUANTITATIVE: D-Dimer, Quant: 0.55 ug/mL-FEU — ABNORMAL HIGH (ref 0.00–0.50)

## 2020-05-18 LAB — TSH: TSH: 1.125 u[IU]/mL (ref 0.350–4.500)

## 2020-05-18 MED ORDER — SODIUM CHLORIDE 0.9% FLUSH
3.0000 mL | Freq: Once | INTRAVENOUS | Status: AC
  Administered 2020-05-18: 3 mL via INTRAVENOUS

## 2020-05-18 MED ORDER — IBUPROFEN 400 MG PO TABS: 400.0000 mg | ORAL_TABLET | Freq: Four times a day (QID) | ORAL | Status: DC | PRN

## 2020-05-18 MED ORDER — ENOXAPARIN SODIUM 40 MG/0.4ML ~~LOC~~ SOLN
40.0000 mg | SUBCUTANEOUS | Status: DC
  Administered 2020-05-18 – 2020-05-19 (×2): 40 mg via SUBCUTANEOUS
  Filled 2020-05-18 (×2): qty 0.4

## 2020-05-18 MED ORDER — CIPROFLOXACIN HCL 500 MG PO TABS
500.0000 mg | ORAL_TABLET | Freq: Two times a day (BID) | ORAL | Status: DC
  Administered 2020-05-18: 500 mg via ORAL
  Filled 2020-05-18: qty 1

## 2020-05-18 MED ORDER — LISINOPRIL 10 MG PO TABS
10.0000 mg | ORAL_TABLET | Freq: Every day | ORAL | Status: DC
  Administered 2020-05-19 – 2020-05-21 (×3): 10 mg via ORAL
  Filled 2020-05-18 (×3): qty 1

## 2020-05-18 MED ORDER — ACETAMINOPHEN 650 MG RE SUPP: 650.0000 mg | Freq: Four times a day (QID) | RECTAL | Status: DC | PRN

## 2020-05-18 MED ORDER — HYDROCHLOROTHIAZIDE 25 MG PO TABS
12.5000 mg | ORAL_TABLET | Freq: Every day | ORAL | Status: DC
  Administered 2020-05-19 – 2020-05-20 (×2): 12.5 mg via ORAL
  Filled 2020-05-18 (×2): qty 1

## 2020-05-18 MED ORDER — HYDROCHLOROTHIAZIDE 25 MG PO TABS: 12.5000 mg | ORAL_TABLET | Freq: Every day | ORAL | Status: DC

## 2020-05-18 MED ORDER — TRAZODONE HCL 50 MG PO TABS
50.0000 mg | ORAL_TABLET | Freq: Once | ORAL | Status: AC
  Administered 2020-05-19: 50 mg via ORAL
  Filled 2020-05-18: qty 1

## 2020-05-18 MED ORDER — ALBUTEROL SULFATE (2.5 MG/3ML) 0.083% IN NEBU: 2.5000 mg | INHALATION_SOLUTION | Freq: Four times a day (QID) | RESPIRATORY_TRACT | Status: DC | PRN

## 2020-05-18 MED ORDER — ACETAMINOPHEN 325 MG PO TABS
650.0000 mg | ORAL_TABLET | Freq: Four times a day (QID) | ORAL | Status: DC | PRN
  Administered 2020-05-20: 650 mg via ORAL
  Filled 2020-05-18: qty 2

## 2020-05-18 NOTE — H&P (Signed)
History and Physical    Teresa Keller NTI:[id number] DOB: 1941-08-16 DOA: 05/18/2020  PCP: Nolene Ebbs, MD (Confirm with patient/family/NH records and if not entered, this has to be entered at [facility] point of entry) Patient coming from: Home  I have personally briefly reviewed patient's old medical records in [place]  Chief Complaint: Chest pain  HPI: Teresa Keller is a 79 y.o. female with medical history significant of with no significant past medical history presented with new onset chest pains.  First episode started 4 days ago while at rest, retrosternal, radiated to the left side, sharp-like 6-7 over 10.  Chest pain lasted about 3 to 5 minutes, associated with shortness of breath and palpitations, then subsided on its own.  Chest pain exacerbated with moving around, she remembered took 1-2 ibuprofen without significant improvement until it subsided by its own.  Same night she went to urgent care for UTI symptoms and was having another episode at the urgent care where they did the EKG and showed no acute finding, but her blood pressure was significant I wanted at the urgent care and they started her on hydrochlorothiazide.  She continue to have intermittent chest pains over the last 4 to 5 days, most episodes were short and mild, but today at noon time she started to have severe chest pain 8/10, with short of breath, persistent chest pains beginning around noon today. EMS gave aspirin and 1 sublingual nitroglycerin with improvement in symptoms.    Denied any recent long distance travel, no leg swelling or pain, no cough no fever chills.  ED Course: EKG reassuring, first set troponin negative.  Review of Systems: As per HPI otherwise 10 point review of systems negative.    Past Medical History:  Diagnosis Date  . Arthritis   . Common bile duct stone   . Diverticulitis     Past Surgical History:  Procedure Laterality Date  . CHOLECYSTECTOMY  october 2012  . ERCP  08/22/2012    Procedure: ENDOSCOPIC RETROGRADE CHOLANGIOPANCREATOGRAPHY (ERCP);  Surgeon: Inda Castle, MD;  Location: [place];  Service: Endoscopy;  Laterality: N/A;  . ERCP N/A 07/31/2013   Procedure: ENDOSCOPIC RETROGRADE CHOLANGIOPANCREATOGRAPHY (ERCP);  Surgeon: Irene Shipper, MD;  Location: [facility] ENDOSCOPY;  Service: Endoscopy;  Laterality: N/A;  . ESOPHAGOGASTRODUODENOSCOPY  04/25/13   Surveillance at [facility] - Normal appearing ampulla; No residual adenomatous tissue seen  . EUS  11/21/2012   Procedure: UPPER ENDOSCOPIC ULTRASOUND (EUS) LINEAR;  Surgeon: Milus Banister, MD;  Location: [facility] ENDOSCOPY;  Service: Endoscopy;  Laterality: N/A;  . HERNIA REPAIR    . TUBAL LIGATION    . VENTRAL HERNIA REPAIR  08/2011   s/p chole, at trochar site, by Dr. Lucia Gaskins     reports that she has never smoked. She has never used smokeless tobacco. She reports that she does not drink alcohol and does not use drugs.  Allergies  Allergen Reactions  . Codeine Nausea And Vomiting  . Sulfur Nausea And Vomiting    Family History  Problem Relation Age of Onset  . Hypertension Mother   . Colon cancer Mother 56  . Hypertension Sister      Prior to Admission medications   Medication Sig Start Date End Date Taking? Authorizing Provider  albuterol (VENTOLIN HFA) 108 (90 Base) MCG/ACT inhaler Inhale 1-2 puffs into the lungs every 6 (six) hours as needed for wheezing or shortness of breath. 05/14/20  Yes Enrique Sack, FNP  ciprofloxacin (CIPRO) 500 MG  tablet Take 1 tablet (500 mg total) by mouth 2 (two) times daily. 05/14/20  Yes Enrique Sack, FNP  ibuprofen (ADVIL,MOTRIN) 200 MG tablet Take 400 mg by mouth every 6 (six) hours as needed for moderate pain.   Yes [provider]  lisinopril-hydrochlorothiazide (ZESTORETIC) 10-12.5 MG tablet Take 1 tablet by mouth daily. 05/14/20 06/13/20 Yes Enrique Sack, FNP  gabapentin (NEURONTIN) 100 MG capsule Take 2 capsules (200 mg total) by mouth 3 (three) times  daily as needed. Patient not taking: Reported on 10/14/2019 09/08/19 05/03/20  Virgel Manifold, MD    Physical Exam: Vitals:   05/18/20 1330 05/18/20 1400 05/18/20 1446 05/18/20 1448  BP: 119/77 104/74  104/68  Pulse: 85 78  70  Resp: (!) 26 20  20   Temp:      TempSrc:      SpO2: 96% 96%  97%  Weight:   69.9 kg   Height:   5\' 2"  (1.575 m)     Constitutional: NAD, calm, comfortable Vitals:   05/18/20 1330 05/18/20 1400 05/18/20 1446 05/18/20 1448  BP: 119/77 104/74  104/68  Pulse: 85 78  70  Resp: (!) 26 20  20   Temp:      TempSrc:      SpO2: 96% 96%  97%  Weight:   69.9 kg   Height:   5\' 2"  (1.575 m)    Eyes: PERRL, lids and conjunctivae normal ENMT: Mucous membranes are moist. Posterior pharynx clear of any exudate or lesions.Normal dentition.  Neck: normal, supple, no masses, no thyromegaly Respiratory: clear to auscultation bilaterally, no wheezing, no crackles. Normal respiratory effort. No accessory muscle use. Tenderness on palpation at lower level of sternum Cardiovascular: Regular rate and rhythm, no murmurs / rubs / gallops. No extremity edema. 2+ pedal pulses. No carotid bruits.  Abdomen: no tenderness, no masses palpated. No hepatosplenomegaly. Bowel sounds positive.  Musculoskeletal: no clubbing / cyanosis. No joint deformity upper and lower extremities. Good ROM, no contractures. Normal muscle tone.  Skin: no rashes, lesions, ulcers. No induration Neurologic: CN 2-12 grossly intact. Sensation intact, DTR normal. Strength 5/5 in all 4.  Psychiatric: Normal judgment and insight. Alert and oriented x 3. Normal mood.     Labs on Admission: I have personally reviewed following labs and imaging studies  CBC: Recent Labs  Lab 05/18/20 1300  WBC 11.4*  HGB 12.9  HCT 40.6  MCV 85.8  PLT 937   Basic Metabolic Panel: Recent Labs  Lab 05/18/20 1300  NA 134*  K 3.9  CL 100  CO2 22  GLUCOSE 111*  BUN 18  CREATININE 1.05*  CALCIUM 9.5   GFR: Estimated  Creatinine Clearance: 40.4 mL/min (A) (by C-G formula based on SCr of 1.05 mg/dL (H)). Liver Function Tests: No results for input(s): AST, ALT, ALKPHOS, BILITOT, PROT, ALBUMIN in the last 168 hours. No results for input(s): LIPASE, AMYLASE in the last 168 hours. No results for input(s): AMMONIA in the last 168 hours. Coagulation Profile: No results for input(s): INR, PROTIME in the last 168 hours. Cardiac Enzymes: No results for input(s): CKTOTAL, CKMB, CKMBINDEX, TROPONINI in the last 168 hours. BNP (last 3 results) No results for input(s): PROBNP in the last 8760 hours. HbA1C: No results for input(s): HGBA1C in the last 72 hours. CBG: No results for input(s): GLUCAP in the last 168 hours. Lipid Profile: No results for input(s): CHOL, HDL, LDLCALC, TRIG, CHOLHDL, LDLDIRECT in the last 72 hours. Thyroid Function Tests: No results for input(s): TSH, T4TOTAL,  FREET4, T3FREE, THYROIDAB in the last 72 hours. Anemia Panel: No results for input(s): VITAMINB12, FOLATE, FERRITIN, TIBC, IRON, RETICCTPCT in the last 72 hours. Urine analysis:    Component Value Date/Time   COLORURINE STRAW (A) 03/18/2018 2005   APPEARANCEUR CLEAR 03/18/2018 2005   LABSPEC 1.025 05/14/2020 0929   PHURINE 7.0 05/14/2020 0929   GLUCOSEU NEGATIVE 05/14/2020 0929   HGBUR NEGATIVE 05/14/2020 0929   BILIRUBINUR NEGATIVE 05/14/2020 0929   KETONESUR NEGATIVE 05/14/2020 0929   PROTEINUR NEGATIVE 05/14/2020 0929   UROBILINOGEN 0.2 05/14/2020 0929   NITRITE NEGATIVE 05/14/2020 0929   LEUKOCYTESUR TRACE (A) 05/14/2020 0929    Radiological Exams on Admission: DG Chest 2 View  Result Date: 05/18/2020 CLINICAL DATA:  Center left side chest pain for 5 days. EXAM: CHEST - 2 VIEW COMPARISON:  PA and lateral chest 05/14/2020 FINDINGS: Lungs clear. Heart size normal. No pneumothorax or pleural fluid. No acute or focal bony abnormality. IMPRESSION: No acute disease. Electronically Signed   By: Inge Rise M.D.   On:  05/18/2020 14:47    EKG: Independently reviewed. No acute ST-T changes.  Assessment/Plan Active Problems:   Chest pain at rest   Chest pain  (please populate well all problems here in Problem List. (For example, if patient is on BP meds at home and you resume or decide to hold them, it is a problem that needs to be her. Same for CAD, COPD, HLD and so on)  Atypical chest pain rule out ACS -Repeat troponin tonight, also ordered echocardiogram, consider cardiology consultation if echocardiogram showing any wall motion of normalities -Check lipid panel TSH -Send a D-dimer given her PE risk is low.  HTN -Continue HCTZ/lisinopril  UTI -Continue antibiotics  DVT prophylaxis: Lovenox Code Status: Full code Family Communication: Granddaughter at bedside Disposition Plan: Likely can go home within 24 hours after echocardiogram and repeated troponin Consults called: None Admission status: Telemetry observation   Lequita Halt MD [facility] Pager [phone]    05/18/2020, 4:13 PM

## 2020-05-18 NOTE — ED Triage Notes (Signed)
Patient brought from home via [facility] for sudden onset of stabbing 10/10 chest pain that started around noon today. Received 1x SL NTG and 324mg  aspirin from EMS, pain reduced from 10/10 to 6/10 after NTG, patient rates pain 4/10 on arrival to ED. Denies shortness of breath, nausea, dizziness. Patient alert, oriented, and in no apparent distress at this time.

## 2020-05-18 NOTE — ED Provider Notes (Signed)
[facility] Provider Note   CSN: [id number] Arrival date & time: 05/18/20  1251     History Chief Complaint  Patient presents with  . Chest Pain    [facility] is a 79 y.o. female with history of arthritis, diverticulitis, hypertension, prediabetes presents for evaluation of acute onset, persistent chest pains beginning around noon today.  She reports that symptoms began while at rest.  She has been experiencing intermittent chest pains for the last 5 days or so.  She reports the pain is sharp, begins in the substernal midline region and radiates along the left side under the left breast.  She reports associated shortness of breath at times as well as nausea.  Today the pain was more severe and was associated with emesis.  Denies abdominal pain, syncope, diaphoresis with this.  No fevers or chills.  EMS gave aspirin and 1 sublingual nitroglycerin with improvement in symptoms.  She is a non-smoker, denies recreational drug use or excessive alcohol use.  She does not have a cardiologist and has never had a cavity at catheterization.  She denies leg swelling.  Family has noted the patient to be more short of breath lately and she has been using an albuterol inhaler with a little bit of relief.  She was seen and evaluated at urgent care for her symptoms on 05/14/2020 and underwent limited but reassuring work-up.  She was noted to be hypertensive and was started on lisinopril/HCTZ with which she reports compliance.  She has been checking her blood pressures at home and notes that they have been somewhat labile but cannot tell me exact numbers.  The history is provided by the patient.    HPI: A 79 year old patient with a history of hypertension presents for evaluation of chest pain. Initial onset of pain was approximately 1-3 hours ago. The patient's chest pain is sharp, is not worse with exertion and is relieved by nitroglycerin. The patient complains of  nausea. The patient's chest pain is middle- or left-sided, is not well-localized, is not described as heaviness/pressure/tightness and does not radiate to the arms/jaw/neck. The patient denies diaphoresis. The patient has no history of stroke, has no history of peripheral artery disease, has not smoked in the past 90 days, denies any history of treated diabetes, has no relevant family history of coronary artery disease (first degree relative at less than age 87), has no history of hypercholesterolemia and does not have an elevated BMI (>=30).   Past Medical History:  Diagnosis Date  . Arthritis   . Common bile duct stone   . Diverticulitis     Patient Active Problem List   Diagnosis Date Noted  . Abdominal pain, epigastric 11/07/2014  . Preventive measure 01/01/2014  . Other screening mammogram 01/01/2014  . Chest pain 12/25/2013  . Choledocholithiasis 08/08/2013  . Uncontrolled hypertension, stage 1 08/26/2012  . Prediabetes 08/26/2012  . Anemia 08/26/2012  . 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 CHOLECYSTECTOMY  october 2012  . ERCP  08/22/2012   Procedure: ENDOSCOPIC RETROGRADE CHOLANGIOPANCREATOGRAPHY (ERCP);  Surgeon: Inda Castle, MD;  Location: [facility];  Service: Endoscopy;  Laterality: N/A;  . ERCP N/A 07/31/2013   Procedure: ENDOSCOPIC RETROGRADE CHOLANGIOPANCREATOGRAPHY (ERCP);  Surgeon: Irene Shipper, MD;  Location: [facility] ENDOSCOPY;  Service: Endoscopy;  Laterality: N/A;  . ESOPHAGOGASTRODUODENOSCOPY  04/25/13   Surveillance at [facility] - Normal appearing ampulla; No residual adenomatous tissue seen  .  EUS  11/21/2012   Procedure: UPPER ENDOSCOPIC ULTRASOUND (EUS) LINEAR;  Surgeon: Milus Banister, MD;  Location: [facility] ENDOSCOPY;  Service: Endoscopy;  Laterality: N/A;  . HERNIA REPAIR    . TUBAL LIGATION    . VENTRAL HERNIA REPAIR  08/2011   s/p chole, at trochar site, by Dr. Lucia Gaskins     OB History   No obstetric  history on file.     Family History  Problem Relation Age of Onset  . Hypertension Mother   . Colon cancer Mother 67  . Hypertension Sister     Social History   Tobacco Use  . Smoking status: Never Smoker  . Smokeless tobacco: Never Used  Vaping Use  . Vaping Use: Never used  Substance Use Topics  . Alcohol use: No  . Drug use: No    Home Medications Prior to Admission medications   Medication Sig Start Date End Date Taking? Authorizing Provider  albuterol (VENTOLIN HFA) 108 (90 Base) MCG/ACT inhaler Inhale 1-2 puffs into the lungs every 6 (six) hours as needed for wheezing or shortness of breath. 05/14/20  Yes Enrique Sack, FNP  ciprofloxacin (CIPRO) 500 MG tablet Take 1 tablet (500 mg total) by mouth 2 (two) times daily. 05/14/20  Yes Enrique Sack, FNP  ibuprofen (ADVIL,MOTRIN) 200 MG tablet Take 400 mg by mouth every 6 (six) hours as needed for moderate pain.   Yes [provider]  lisinopril-hydrochlorothiazide (ZESTORETIC) 10-12.5 MG tablet Take 1 tablet by mouth daily. 05/14/20 06/13/20 Yes Enrique Sack, FNP  gabapentin (NEURONTIN) 100 MG capsule Take 2 capsules (200 mg total) by mouth 3 (three) times daily as needed. Patient not taking: Reported on 10/14/2019 09/08/19 05/03/20  Virgel Manifold, MD    Allergies    Codeine and Sulfur  Review of Systems   Review of Systems  Constitutional: Negative for chills and fever.  Respiratory: Positive for shortness of breath.   Cardiovascular: Positive for chest pain. Negative for leg swelling.  Gastrointestinal: Positive for nausea and vomiting. Negative for abdominal pain.  All other systems reviewed and are negative.   Physical Exam Updated Vital Signs BP 104/68   Pulse 70   Temp 98.2 F (36.8 C) (Oral)   Resp 20   Ht 5\' 2"  (1.575 m)   Wt 69.9 kg   SpO2 97%   BMI 28.17 kg/m   Physical Exam Vitals and nursing note reviewed.  Constitutional:      General: She is not in acute distress.     Appearance: She is well-developed.     Comments: Resting comfortably in bed  HENT:     Head: Normocephalic and atraumatic.  Eyes:     General:        Right eye: No discharge.        Left eye: No discharge.     Conjunctiva/sclera: Conjunctivae normal.  Neck:     Vascular: No JVD.     Trachea: No tracheal deviation.  Cardiovascular:     Rate and Rhythm: Normal rate and regular rhythm.     Heart sounds: Normal heart sounds.     Comments: 2+ radial and DP/PT pulses bilaterally, Homans sign absent bilaterally, no lower extremity edema, no palpable cords, compartments are soft  Pulmonary:     Effort: Pulmonary effort is normal.  Chest:     Chest wall: No tenderness.  Abdominal:     General: There is no distension.     Palpations: Abdomen is soft.     Tenderness:  There is no abdominal tenderness. There is no guarding or rebound.  Musculoskeletal:     Cervical back: Normal range of motion and neck supple.     Right lower leg: No tenderness. No edema.     Left lower leg: No tenderness. No edema.  Skin:    General: Skin is warm and dry.     Findings: No erythema.  Neurological:     Mental Status: She is alert.  Psychiatric:        Behavior: Behavior normal.     ED Results / Procedures / Treatments   Labs (all labs ordered are listed, but only abnormal results are displayed) Labs Reviewed  BASIC METABOLIC PANEL - Abnormal; Notable for the following components:      Result Value   Sodium 134 (*)    Glucose, Bld 111 (*)    Creatinine, Ser 1.05 (*)    GFR calc non Af Amer 51 (*)    GFR calc Af Amer 59 (*)    All other components within normal limits  CBC - Abnormal; Notable for the following components:   WBC 11.4 (*)    All other components within normal limits  SARS CORONAVIRUS 2 BY RT PCR (HOSPITAL ORDER, [place] [facility])  TROPONIN I (HIGH SENSITIVITY)  TROPONIN I (HIGH SENSITIVITY)    EKG EKG Interpretation  Date/Time:  Saturday May 18 2020  14:48:05 EDT Ventricular Rate:  71 PR Interval:    QRS Duration: 83 QT Interval:  395 QTC Calculation: 430 R Axis:   32 Text Interpretation: Sinus rhythm Since last tracing ST changes are improved Confirmed by Calvert Cantor [phone]) on 05/18/2020 2:49:56 PM   Radiology DG Chest 2 View  Result Date: 05/18/2020 CLINICAL DATA:  Center left side chest pain for 5 days. EXAM: CHEST - 2 VIEW COMPARISON:  PA and lateral chest 05/14/2020 FINDINGS: Lungs clear. Heart size normal. No pneumothorax or pleural fluid. No acute or focal bony abnormality. IMPRESSION: No acute disease. Electronically Signed   By: Inge Rise M.D.   On: 05/18/2020 14:47    Procedures Procedures (including critical care time)  Medications Ordered in ED Medications  sodium chloride flush (NS) 0.9 % injection 3 mL (3 mLs Intravenous Given 05/18/20 1457)    ED Course  I have reviewed the triage vital signs and the nursing notes.  Pertinent labs & imaging results that were available during my care of the patient were reviewed by me and considered in my medical decision making (see chart for details).    MDM Rules/Calculators/A&P HEAR Score: 5                        Patient presenting for evaluation of progressively worsening left-sided chest pains.  She is afebrile, vital signs are stable.  She is nontoxic in appearance.  Pain has been intermittent since Tuesday but worsened today in severity.  She noted associated vomiting and shortness of breath today.  She improved with aspirin and nitroglycerin administered by the EMTs.  Her EKG today shows ST depressions in the inferior lateral leads but no elevations or other reciprocal changes.  Chest x-ray shows no acute cardiopulmonary abnormalities.  Lab work reviewed and interpreted by myself shows nonspecific leukocytosis, no anemia, no metabolic derangements.  Her initial troponin is within normal limits.  We obtained repeat EKG which shows improvement in her ST depressions  and she is currently chest pain-free.  She has a HEART score of  5 and with EKG changes and progressive symptoms she would likely benefit from admission for chest pain work-up.  Doubt PE, dissection, cardiac tamponade, esophageal rupture, pneumonia or pneumothorax.  Spoke with Dr. Roosevelt Locks with [facility] hospitalist service who agrees to assume care of patient and bring her into the hospital for further evaluation and management.  Patient was seen and evaluate by Dr. Karle Starch who agrees with assessment and plan at this time.   Final Clinical Impression(s) / ED Diagnoses Final diagnoses:  Left-sided chest pain  Abnormal EKG    Rx / DC Orders ED Discharge Orders    None       Debroah Baller 05/18/20 1546    Truddie Hidden, MD 05/19/20 [phone]

## 2020-05-19 ENCOUNTER — Observation Stay (HOSPITAL_COMMUNITY): Payer: BC Managed Care – PPO

## 2020-05-19 ENCOUNTER — Observation Stay (HOSPITAL_BASED_OUTPATIENT_CLINIC_OR_DEPARTMENT_OTHER): Payer: BC Managed Care – PPO

## 2020-05-19 ENCOUNTER — Encounter (HOSPITAL_COMMUNITY): Payer: Self-pay | Admitting: Internal Medicine

## 2020-05-19 DIAGNOSIS — R609 Edema, unspecified: Secondary | ICD-10-CM

## 2020-05-19 DIAGNOSIS — R079 Chest pain, unspecified: Secondary | ICD-10-CM

## 2020-05-19 DIAGNOSIS — I10 Essential (primary) hypertension: Secondary | ICD-10-CM

## 2020-05-19 LAB — ECHOCARDIOGRAM COMPLETE
Height: 62 in
Weight: 2505.6 oz

## 2020-05-19 LAB — HEPATIC FUNCTION PANEL
ALT: 17 U/L (ref 0–44)
AST: 24 U/L (ref 15–41)
Albumin: 3.4 g/dL — ABNORMAL LOW (ref 3.5–5.0)
Alkaline Phosphatase: 82 U/L (ref 38–126)
Bilirubin, Direct: <0.1 mg/dL (ref 0.0–0.2)
Total Bilirubin: 0.6 mg/dL (ref 0.3–1.2)
Total Protein: 7.5 g/dL (ref 6.5–8.1)

## 2020-05-19 LAB — LIPASE, BLOOD: Lipase: 25 U/L (ref 11–51)

## 2020-05-19 MED ORDER — PENICILLIN V POTASSIUM 250 MG PO TABS
250.0000 mg | ORAL_TABLET | Freq: Three times a day (TID) | ORAL | Status: DC
  Administered 2020-05-19 – 2020-05-21 (×8): 250 mg via ORAL
  Filled 2020-05-19 (×11): qty 1

## 2020-05-19 MED ORDER — PANTOPRAZOLE SODIUM 40 MG PO TBEC
40.0000 mg | DELAYED_RELEASE_TABLET | Freq: Two times a day (BID) | ORAL | Status: DC
  Administered 2020-05-19 – 2020-05-21 (×5): 40 mg via ORAL
  Filled 2020-05-19 (×5): qty 1

## 2020-05-19 MED ORDER — REGADENOSON 0.4 MG/5ML IV SOLN
0.4000 mg | Freq: Once | INTRAVENOUS | Status: AC
  Filled 2020-05-19: qty 5

## 2020-05-19 MED ORDER — CAPSAICIN 0.025 % EX CREA
TOPICAL_CREAM | Freq: Two times a day (BID) | CUTANEOUS | Status: DC
  Administered 2020-05-19: 1 via TOPICAL
  Filled 2020-05-19: qty 60

## 2020-05-19 MED ORDER — RAMELTEON 8 MG PO TABS
8.0000 mg | ORAL_TABLET | Freq: Once | ORAL | Status: AC | PRN
  Administered 2020-05-19: 8 mg via ORAL
  Filled 2020-05-19: qty 1

## 2020-05-19 MED ORDER — SIMVASTATIN 20 MG PO TABS
20.0000 mg | ORAL_TABLET | Freq: Every day | ORAL | Status: DC
  Administered 2020-05-19 – 2020-05-20 (×2): 20 mg via ORAL
  Filled 2020-05-19 (×3): qty 1

## 2020-05-19 MED ORDER — TRAZODONE HCL 50 MG PO TABS: 50.0000 mg | ORAL_TABLET | Freq: Every evening | ORAL | Status: DC | PRN

## 2020-05-19 NOTE — Progress Notes (Signed)
PROGRESS NOTE    Teresa Keller  OXB:[id number] DOB: 1941/05/27 DOA: 05/18/2020 PCP: Nolene Ebbs, MD   Brief Narrative: [age]-year-old with past medical history significant for arthritis, common bile duct stone, diverticulitis who presents complaining of chest pain, while at rest 7 out of 10 lasted 3 to 5 minutes associated with shortness of breath and palpitation.  Also reported chest pain exacerbated with moving around. Was recently diagnosed with a UTI, elevated blood pressure and is started on hydrochlorothiazide.  Evaluation in the ED; first set of troponin negative.  Assessment & Plan:   Active Problems:   Chest pain at rest   Left-sided chest pain   1-Chest pain: Multiples risk factors; HLD, Obesity, 79 year old, HTN.  Follow echocardiogram; normal EF, hyperdynamic function, no wall motion abnormalities.  Troponin  negative. D-dimer 0.55, elevated 0.5 point. She is chest pain-free, no hypoxic. Liver function test normal. TSH normal 1.1, lipase normal, liver function test normal EKG: some ST changes abnormalities.  Cardiology consulted, plan for stress test tomorrow.  Trial of PPI.   2-UTI: Growing Corynebacterium.  Will change ciprofloxacin to penicillin  3-Hypertension: Continue with hydrochlorothiazide lisinopril  4-Mild elevated D dimer; Doppler negative.  5-HLD: LDL 135: Start statins 6-Mild hyponatremia: Monitor on hydrochlorothiazide Estimated body mass index is 28.64 kg/m as calculated from the following:   Height as of this encounter: 5\' 2"  (1.575 m).   Weight as of this encounter: 71 kg.   DVT prophylaxis: Lovenox Code Status: Full code Family Communication: Care discussed with patient Disposition Plan:  Status is: Observation  Dispo: The patient is from: Home              Anticipated d/c is to: Home              Anticipated d/c date is: 2 days              Patient currently is not medically stable to d/c.  Plan for stress test  tomorrow        Consultants:   Cardiology  Procedures:   Echo  Antimicrobials:    Subjective: Chest pain-free this morning.  She reported episode of chest pain today prior to admission, reports 2 other episodes a week ago  Objective: Vitals:   05/19/20 0454 05/19/20 0750 05/19/20 0934 05/19/20 1110  BP: (!) 123/57 109/60 130/75 (!) 115/57  Pulse: 98 96  (!) 105  Resp: 16 16  18   Temp: 98.3 F (36.8 C) 98.8 F (37.1 C)  (!) 97.4 F (36.3 C)  TempSrc: Oral Oral  Oral  SpO2: 95% 98%  94%  Weight: 71 kg     Height:        Intake/Output Summary (Last 24 hours) at 05/19/2020 1515 Last data filed at 05/19/2020 1000 Gross per 24 hour  Intake 220 ml  Output --  Net 220 ml   Filed Weights   05/18/20 1446 05/18/20 1913 05/19/20 0454  Weight: 69.9 kg 71.7 kg 71 kg    Examination:  General exam: Appears calm and comfortable  Respiratory system: Clear to auscultation. Respiratory effort normal. Cardiovascular system: S1 & S2 heard, RRR. No JVD, murmurs, rubs, gallops or clicks. No pedal edema. Gastrointestinal system: Abdomen is nondistended, soft and nontender. No organomegaly or masses felt. Normal bowel sounds heard. Central nervous system: Alert and oriented. No focal neurological deficits. Extremities: Symmetric 5 x 5 power. Skin: No rashes, lesions or ulcers    Data Reviewed: I have personally reviewed following labs  and imaging studies  CBC: Recent Labs  Lab 05/18/20 1300  WBC 11.4*  HGB 12.9  HCT 40.6  MCV 85.8  PLT 852   Basic Metabolic Panel: Recent Labs  Lab 05/18/20 1300  NA 134*  K 3.9  CL 100  CO2 22  GLUCOSE 111*  BUN 18  CREATININE 1.05*  CALCIUM 9.5   GFR: Estimated Creatinine Clearance: 40.8 mL/min (A) (by C-G formula based on SCr of 1.05 mg/dL (H)). Liver Function Tests: Recent Labs  Lab 05/19/20 0923  AST 24  ALT 17  ALKPHOS 82  BILITOT 0.6  PROT 7.5  ALBUMIN 3.4*   Recent Labs  Lab 05/19/20 0923  LIPASE 25    No results for input(s): AMMONIA in the last 168 hours. Coagulation Profile: No results for input(s): INR, PROTIME in the last 168 hours. Cardiac Enzymes: No results for input(s): CKTOTAL, CKMB, CKMBINDEX, TROPONINI in the last 168 hours. BNP (last 3 results) No results for input(s): PROBNP in the last 8760 hours. HbA1C: No results for input(s): HGBA1C in the last 72 hours. CBG: No results for input(s): GLUCAP in the last 168 hours. Lipid Profile: Recent Labs    05/18/20 1300  CHOL 201*  HDL 46  LDLCALC 135*  TRIG 99  CHOLHDL 4.4   Thyroid Function Tests: Recent Labs    05/18/20 1924  TSH 1.125   Anemia Panel: No results for input(s): VITAMINB12, FOLATE, FERRITIN, TIBC, IRON, RETICCTPCT in the last 72 hours. Sepsis Labs: No results for input(s): PROCALCITON, LATICACIDVEN in the last 168 hours.  Recent Results (from the past 240 hour(s))  Urine culture     Status: Abnormal   Collection Time: 05/14/20  9:38 AM   Specimen: Urine, Clean Catch  Result Value Ref Range Status   Specimen Description URINE, CLEAN CATCH  Final   Special Requests   Final    Normal Performed at [facility], [address], [place], [place] [address]    Culture (A)  Final    80,000 COLONIES/mL DIPHTHEROIDS(CORYNEBACTERIUM SPECIES)   Report Status 05/15/2020 FINAL  Final  SARS Coronavirus 2 by RT PCR (hospital order, performed in [facility] hospital lab) Nasopharyngeal Nasopharyngeal Swab     Status: None   Collection Time: 05/18/20  2:55 PM   Specimen: Nasopharyngeal Swab  Result Value Ref Range Status   SARS Coronavirus 2 NEGATIVE NEGATIVE Final    Comment: (NOTE) SARS-CoV-2 target nucleic acids are NOT DETECTED.  The SARS-CoV-2 RNA is generally detectable in upper and lower respiratory specimens during the acute phase of infection. The lowest concentration of SARS-CoV-2 viral copies this assay can detect is 250 copies / mL. A negative result does not preclude SARS-CoV-2  infection and should not be used as the sole basis for treatment or other patient management decisions.  A negative result may occur with improper specimen collection / handling, submission of specimen other than nasopharyngeal swab, presence of viral mutation(s) within the areas targeted by this assay, and inadequate number of viral copies (<250 copies / mL). A negative result must be combined with clinical observations, patient history, and epidemiological information.  Fact Sheet for Patients:   StrictlyIdeas.no  Fact Sheet for Healthcare Providers: BankingDealers.co.za  This test is not yet approved or  cleared by the [place] FDA and has been authorized for detection and/or diagnosis of SARS-CoV-2 by FDA under an Emergency Use Authorization (EUA).  This EUA will remain in effect (meaning this test can be used) for the duration of  the COVID-19 declaration under Section 564(b)(1) of the Act, 21 U.S.C. section 360bbb-3(b)(1), unless the authorization is terminated or revoked sooner.  Performed at [facility], [place] [address], [place], [place] [address]          Radiology Studies: DG Chest 2 View  Result Date: 05/18/2020 CLINICAL DATA:  Center left side chest pain for 5 days. EXAM: CHEST - 2 VIEW COMPARISON:  PA and lateral chest 05/14/2020 FINDINGS: Lungs clear. Heart size normal. No pneumothorax or pleural fluid. No acute or focal bony abnormality. IMPRESSION: No acute disease. Electronically Signed   By: Inge Rise M.D.   On: 05/18/2020 14:47   ECHOCARDIOGRAM COMPLETE  Result Date: 05/19/2020    ECHOCARDIOGRAM REPORT   Patient Name:   MYRL BYNUM Date of Exam: 05/19/2020 Medical Rec #:  [id number]        Height:       62.0 in Accession #:    [id number]       Weight:       156.6 lb Date of Birth:  1941/06/09        BSA:          1.723 m Patient Age:    41 years         BP:           130/75 mmHg Patient Gender: F                 HR:           94 bpm. Exam Location:  Inpatient Procedure: 2D Echo, Cardiac Doppler and Color Doppler Indications:    Chest pain  History:        Patient has no prior history of Echocardiogram examinations.                 Risk Factors:Hypertension.  Sonographer:    Clayton Lefort RDCS (AE) Referring Phys: [id number] [place]  1. Vigorous LV systolic function; mild proximal septal thickening; grade 1 diastolic dysfunction; mild LVOT gradient of 1.8 m/s likely related to vigorous LV function.  2. Left ventricular ejection fraction, by estimation, is 70 to 75%. The left ventricle has hyperdynamic function. The left ventricle has no regional wall motion abnormalities. There is mild left ventricular hypertrophy of the basal-septal segment. Left ventricular diastolic parameters are consistent with Grade I diastolic dysfunction (impaired relaxation).  3. Right ventricular systolic function is normal. The right ventricular size is normal. There is normal pulmonary artery systolic pressure.  4. The mitral valve is normal in structure. Trivial mitral valve regurgitation. No evidence of mitral stenosis.  5. The aortic valve is tricuspid. Aortic valve regurgitation is not visualized. Mild aortic valve sclerosis is present, with no evidence of aortic valve stenosis.  6. The inferior vena cava is normal in size with greater than 50% respiratory variability, suggesting right atrial pressure of 3 mmHg. FINDINGS  Left Ventricle: Left ventricular ejection fraction, by estimation, is 70 to 75%. The left ventricle has hyperdynamic function. The left ventricle has no regional wall motion abnormalities. The left ventricular internal cavity size was normal in size. There is mild left ventricular hypertrophy of the basal-septal segment. Left ventricular diastolic parameters are consistent with Grade I diastolic dysfunction (impaired relaxation). Right Ventricle: The right ventricular size is normal. Right  ventricular systolic function is normal. There is normal pulmonary artery systolic pressure. The tricuspid regurgitant velocity is 2.25 m/s, and with an assumed right atrial pressure of 3 mmHg,  the  estimated right ventricular systolic pressure is 25.9 mmHg. Left Atrium: Left atrial size was normal in size. Right Atrium: Right atrial size was normal in size. Pericardium: There is no evidence of pericardial effusion. Mitral Valve: The mitral valve is normal in structure. Normal mobility of the mitral valve leaflets. Mild mitral annular calcification. Trivial mitral valve regurgitation. No evidence of mitral valve stenosis. MV peak gradient, 3.4 mmHg. The mean mitral valve gradient is 1.0 mmHg. Tricuspid Valve: The tricuspid valve is normal in structure. Tricuspid valve regurgitation is trivial. No evidence of tricuspid stenosis. Aortic Valve: The aortic valve is tricuspid. Aortic valve regurgitation is not visualized. Mild aortic valve sclerosis is present, with no evidence of aortic valve stenosis. Aortic valve mean gradient measures 6.0 mmHg. Aortic valve peak gradient measures 8.4 mmHg. Aortic valve area, by VTI measures 2.16 cm. Pulmonic Valve: The pulmonic valve was not well visualized. Pulmonic valve regurgitation is not visualized. No evidence of pulmonic stenosis. Aorta: The aortic root is normal in size and structure. Venous: The inferior vena cava is normal in size with greater than 50% respiratory variability, suggesting right atrial pressure of 3 mmHg. IAS/Shunts: No atrial level shunt detected by color flow Doppler. Additional Comments: Vigorous LV systolic function; mild proximal septal thickening; grade 1 diastolic dysfunction; mild LVOT gradient of 1.8 m/s likely related to vigorous LV function.  LEFT VENTRICLE PLAX 2D LVIDd:         3.70 cm  Diastology LVIDs:         3.00 cm  LV e' lateral:   8.48 cm/s LV PW:         1.20 cm  LV E/e' lateral: 7.4 LV IVS:        1.70 cm  LV e' medial:    4.58 cm/s  LVOT diam:     1.70 cm  LV E/e' medial:  13.8 LV SV:         53 LV SV Index:   31 LVOT Area:     2.27 cm  RIGHT VENTRICLE            IVC RV Basal diam:  2.50 cm    IVC diam: 1.70 cm RV S prime:     9.68 cm/s TAPSE (M-mode): 1.7 cm LEFT ATRIUM             Index       RIGHT ATRIUM          Index LA diam:        2.80 cm 1.63 cm/m  RA Area:     9.69 cm LA Vol (A2C):   22.9 ml 13.29 ml/m RA Volume:   18.20 ml 10.56 ml/m LA Vol (A4C):   18.4 ml 10.68 ml/m LA Biplane Vol: 21.6 ml 12.54 ml/m  AORTIC VALVE AV Area (Vmax):    1.83 cm AV Area (Vmean):   1.73 cm AV Area (VTI):     2.16 cm AV Vmax:           145.00 cm/s AV Vmean:          115.000 cm/s AV VTI:            0.245 m AV Peak Grad:      8.4 mmHg AV Mean Grad:      6.0 mmHg LVOT Vmax:         117.00 cm/s LVOT Vmean:        87.500 cm/s LVOT VTI:          0.233 m  LVOT/AV VTI ratio: 0.95  AORTA Ao Root diam: 2.50 cm MITRAL VALVE               TRICUSPID VALVE MV Area (PHT): 2.11 cm    TR Peak grad:   20.2 mmHg MV Peak grad:  3.4 mmHg    TR Vmax:        225.00 cm/s MV Mean grad:  1.0 mmHg MV Vmax:       0.92 m/s    SHUNTS MV Vmean:      55.3 cm/s   Systemic VTI:  0.23 m MV Decel Time: 359 msec    Systemic Diam: 1.70 cm MV E velocity: 63.00 cm/s MV A velocity: 77.10 cm/s MV E/A ratio:  0.82 Kirk Ruths MD Electronically signed by Kirk Ruths MD Signature Date/Time: 05/19/2020/11:59:48 AM    Final    VAS [place] LOWER EXTREMITY VENOUS (DVT)  Result Date: 05/19/2020  Lower Venous DVTStudy Indications: Edema.  Risk Factors: None identified. Comparison Study: No prior studies. Performing Technologist: Oliver Hum RVT  Examination Guidelines: A complete evaluation includes B-mode imaging, spectral Doppler, color Doppler, and power Doppler as needed of all accessible portions of each vessel. Bilateral testing is considered an integral part of a complete examination. Limited examinations for reoccurring indications may be performed as noted. The reflux portion of  the exam is performed with the patient in reverse Trendelenburg.  +---------+---------------+---------+-----------+----------+--------------+  RIGHT     Compressibility Phasicity Spontaneity Properties Thrombus Aging  +---------+---------------+---------+-----------+----------+--------------+  CFV       Full            Yes       Yes                                    +---------+---------------+---------+-----------+----------+--------------+  SFJ       Full                                                             +---------+---------------+---------+-----------+----------+--------------+  FV Prox   Full                                                             +---------+---------------+---------+-----------+----------+--------------+  FV Mid    Full                                                             +---------+---------------+---------+-----------+----------+--------------+  FV Distal Full                                                             +---------+---------------+---------+-----------+----------+--------------+  PFV       Full                                                             +---------+---------------+---------+-----------+----------+--------------+  POP       Full            Yes       Yes                                    +---------+---------------+---------+-----------+----------+--------------+  PTV       Full                                                             +---------+---------------+---------+-----------+----------+--------------+  PERO      Full                                                             +---------+---------------+---------+-----------+----------+--------------+   +---------+---------------+---------+-----------+----------+--------------+  LEFT      Compressibility Phasicity Spontaneity Properties Thrombus Aging  +---------+---------------+---------+-----------+----------+--------------+  CFV       Full            Yes       Yes                                     +---------+---------------+---------+-----------+----------+--------------+  SFJ       Full                                                             +---------+---------------+---------+-----------+----------+--------------+  FV Prox   Full                                                             +---------+---------------+---------+-----------+----------+--------------+  FV Mid    Full                                                             +---------+---------------+---------+-----------+----------+--------------+  FV Distal Full                                                             +---------+---------------+---------+-----------+----------+--------------+  PFV       Full                                                             +---------+---------------+---------+-----------+----------+--------------+  POP       Full            Yes       Yes                                    +---------+---------------+---------+-----------+----------+--------------+  PTV       Full                                                             +---------+---------------+---------+-----------+----------+--------------+  PERO      Full                                                             +---------+---------------+---------+-----------+----------+--------------+     Summary: RIGHT: - There is no evidence of deep vein thrombosis in the lower extremity.  - No cystic structure found in the popliteal fossa.  LEFT: - There is no evidence of deep vein thrombosis in the lower extremity.  - No cystic structure found in the popliteal fossa.  *See table(s) above for measurements and observations. Electronically signed by Monica Martinez MD on 05/19/2020 at 1:18:51 PM.    Final         Scheduled Meds:  capsaicin   Topical BID   enoxaparin (LOVENOX) injection  40 mg Subcutaneous Q24H   hydrochlorothiazide  12.5 mg Oral Daily   lisinopril  10 mg Oral Daily   pantoprazole  40 mg Oral  BID   penicillin v potassium  250 mg Oral Q8H   [START ON 05/20/2020] regadenoson  0.4 mg Intravenous Once   simvastatin  20 mg Oral q1800   Continuous Infusions:   LOS: 0 days    Time spent: 41minutes    Mandel Seiden A Iyonnah Ferrante, MD [facility]   If 7PM-7AM, please contact night-coverage [email or web address]  05/19/2020, 3:15 PM

## 2020-05-19 NOTE — Progress Notes (Signed)
  Echocardiogram 2D Echocardiogram has been performed.  Marybelle Killings 05/19/2020, 11:56 AM

## 2020-05-19 NOTE — Consult Note (Addendum)
Cardiology Consult    Patient ID: MAGDALINA WHITEHEAD MRN: [id number], DOB/AGE: 1941-02-07   Admit date: 05/18/2020 Date of Consult: 05/19/2020  Primary Physician: Nolene Ebbs, MD Primary Cardiologist: Candee Furbish, MD Requesting Provider: B. Regalado, MD  Patient Profile    ICYSS SKOG is a 79 y.o. female with a history of HTN, diverticulitis, CBD stone, OA, and chest pain, who is being seen today for the evaluation of chest pain at the request of Dr. Tyrell Antonio.  Past Medical History   Past Medical History:  Diagnosis Date  . Arthritis    A. Hip & neck - takes ibuprofen 400mg  bid.  . Chest pain    a. 12/2013 ETT: Ex time - 4:00, upsloping ST depression-->low risk.  . Common bile duct stone   . Diverticulitis   . Essential hypertension     Past Surgical History:  Procedure Laterality Date  . CHOLECYSTECTOMY  october 2012  . ERCP  08/22/2012   Procedure: ENDOSCOPIC RETROGRADE CHOLANGIOPANCREATOGRAPHY (ERCP);  Surgeon: Inda Castle, MD;  Location: [facility];  Service: Endoscopy;  Laterality: N/A;  . ERCP N/A 07/31/2013   Procedure: ENDOSCOPIC RETROGRADE CHOLANGIOPANCREATOGRAPHY (ERCP);  Surgeon: Irene Shipper, MD;  Location: [facility] ENDOSCOPY;  Service: Endoscopy;  Laterality: N/A;  . ESOPHAGOGASTRODUODENOSCOPY  04/25/13   Surveillance at [facility] - Normal appearing ampulla; No residual adenomatous tissue seen  . EUS  11/21/2012   Procedure: UPPER ENDOSCOPIC ULTRASOUND (EUS) LINEAR;  Surgeon: Milus Banister, MD;  Location: [facility] ENDOSCOPY;  Service: Endoscopy;  Laterality: N/A;  . HERNIA REPAIR    . TUBAL LIGATION    . VENTRAL HERNIA REPAIR  08/2011   s/p chole, at trochar site, by Dr. Lucia Gaskins     Allergies  Allergies  Allergen Reactions  . Codeine Nausea And Vomiting  . Sulfur Nausea And Vomiting    History of Present Illness    79 y/o ? with a h/o HTN, diverticulitis, CBD stone, and OA.  In 12/2013, she was hospitalized for atypical chest pain and ruled out.  She underwent  ETT which showed poor exercise tolerance (4 mins) but no significant ST/T changes.  She followed up x 1 with Dr. Marlou Porch and had not had recurrent symptoms.  Over the past 6 years, she has generally done well.  She was walking regularly up until about a year ago, when right hip arthritis slowed her down some.  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  She was able to push mow her yard without any difficulty about 3 weeks ago.  In the setting of worsening right hip pain, she has been taking ibuprofen 40 mg twice daily times several months.  Approximately 3 weeks ago, she was seen in urgent care due to worsening right hip pain and she was placed on a prednisone taper.  Since then, she has had approximately 4 episodes of sudden onset of left lower chest dull aching followed by a sharp pain that moves from the left lower sternal border to the axilla, associated with dyspnea, lasting up to 20 minutes, and resolving spontaneously.  Pain is not any worse or better with deep breathing, palpation, or position changes.  Early last week, she was seen in an urgent care due to UTI symptoms and while waiting to be seen, she had recurrent chest pain.  An ECG was performed and was unremarkable.  She was subsequently sent home.  Yesterday, she was at home watching television when she had recurrent dull followed by sharp left-sided chest pain associated with dyspnea, nausea, and vomiting x1.  Following this episode, she presented to the emergency department.  Here, ECG unremarkable.  Troponins normal.  Chest x-ray without acute findings.  She was admitted and troponins have remained normal.  On telemetry, resting heart rate has been in the 90s with occasional spikes into the low 100s.  She has been normotensive and is in no acute distress this morning.  She did have recurrent chest pain during interview today however, she is in no distress..   D-dimer is mildly elevated 0.55 without hypoxia.  Inpatient Medications    . enoxaparin (LOVENOX) injection  40 mg Subcutaneous Q24H  . hydrochlorothiazide  12.5 mg Oral Daily  . lisinopril  10 mg Oral Daily  . pantoprazole  40 mg Oral BID  . penicillin v potassium  250 mg Oral Q8H  . simvastatin  20 mg Oral q1800    Family History    Family History  Problem Relation Age of Onset  . Hypertension Mother        died @ 22  . Colon cancer Mother 3  . Alcohol abuse Father        died in his 19's  . Hypertension Sister   . Diabetes Sister   . Diabetes Brother    She indicated that her mother is deceased. She indicated that her father is deceased. She indicated that her sister is alive. She indicated that her brother is alive.   Social History    Social History   Socioeconomic History  . Marital status: Widowed    Spouse name: Not on file  . Number of children: 2  . Years of education: Not on file  . Highest education level: Not on file  Occupational History  . Not on file  Tobacco Use  . Smoking status: Never Smoker  . Smokeless tobacco: Never Used  Vaping Use  . Vaping Use: Never used  Substance and Sexual Activity  . Alcohol use: No  . Drug use: No  . Sexual activity: Never  Other Topics Concern  . Not on file  Social History Narrative   Lives in [place] by herself.  Still works for [employer] as a Sports coach.  Active in and around her house.   Social Determinants of Health   Financial Resource Strain:   . Difficulty of Paying Living Expenses:   Food Insecurity:   . Worried About Charity fundraiser in the Last Year:   . Arboriculturist in the Last Year:   Transportation Needs:   . Film/video editor (Medical):   Marland Kitchen Lack of Transportation (Non-Medical):   Physical Activity:   . Days of Exercise per Week:   . Minutes of Exercise per Session:   Stress:   . Feeling of Stress :   Social Connections:   . Frequency of Communication with Friends  and Family:   . Frequency of Social Gatherings with Friends and Family:   . Attends Religious Services:   . Active Member of Clubs or Organizations:   . Attends Archivist Meetings:   Marland Kitchen Marital Status:   Intimate Partner Violence:   . Fear of Current or Ex-Partner:   . Emotionally Abused:   Marland Kitchen Physically Abused:   . Sexually Abused:      Review of Systems    General:  No chills, fever, night sweats or  weight changes.  Cardiovascular:  +++ Intermittent chest pain as described above, associated with dyspnea.  No edema, orthopnea, palpitations, paroxysmal nocturnal dyspnea. Dermatological: No rash, lesions/masses Respiratory: No cough, +++ dyspnea associated with chest pain. Urologic: No hematuria, dysuria Abdominal:   +++ nausea and vomiting x1 on the day of admission.  No diarrhea, bright red blood per rectum, melena, or hematemesis Neurologic:  No visual changes, wkns, changes in mental status. All other systems reviewed and are otherwise negative except as noted above.  Physical Exam    Blood pressure 130/75, pulse 96, temperature 98.8 F (37.1 C), temperature source Oral, resp. rate 16, height 5\' 2"  (1.575 m), weight 71 kg, SpO2 98 %.  General: Pleasant, NAD Psych: Normal affect. Neuro: Alert and oriented X 3. Moves all extremities spontaneously. HEENT: Normal  Neck: Supple without bruits or JVD. Lungs:  Resp regular and unlabored, CTA. Heart: RRR no s3, s4, or murmurs. Abdomen: Soft, non-tender, non-distended, BS + x 4.  Extremities: No clubbing, cyanosis or edema. DP/PT/Radials 1+ and equal bilaterally.  Labs    Cardiac Enzymes Recent Labs  Lab 05/18/20 1300 05/18/20 1455 05/18/20 2036  TROPONINIHS 3 3 4       Lab Results  Component Value Date   WBC 11.4 (H) 05/18/2020   HGB 12.9 05/18/2020   HCT 40.6 05/18/2020   MCV 85.8 05/18/2020   PLT 317 05/18/2020    Recent Labs  Lab 05/18/20 1300  NA 134*  K 3.9  CL 100  CO2 22  BUN 18  CREATININE  1.05*  CALCIUM 9.5  GLUCOSE 111*   Lab Results  Component Value Date   CHOL 201 (H) 05/18/2020   HDL 46 05/18/2020   LDLCALC 135 (H) 05/18/2020   TRIG 99 05/18/2020   Lab Results  Component Value Date   DDIMER 0.55 (H) 05/18/2020     Radiology Studies    DG Chest 2 View  Result Date: 05/18/2020 CLINICAL DATA:  Center left side chest pain for 5 days. EXAM: CHEST - 2 VIEW COMPARISON:  PA and lateral chest 05/14/2020 FINDINGS: Lungs clear. Heart size normal. No pneumothorax or pleural fluid. No acute or focal bony abnormality. IMPRESSION: No acute disease. Electronically Signed   By: Inge Rise M.D.   On: 05/18/2020 14:47   DG Chest 2 View  Result Date: 05/14/2020 CLINICAL DATA:  79 year old female with chest pain since last night, left upper quadrant pain. EXAM: CHEST - 2 VIEW COMPARISON:  Chest radiographs 12/24/2013. FINDINGS: Lung volumes and mediastinal contours remain normal. Mild diffuse increased interstitial markings have developed since 2015. No superimposed pneumothorax, pulmonary edema, pleural effusion or confluent pulmonary opacity. No acute osseous abnormality identified. Chronic scoliosis. Stable cholecystectomy clips. Negative visible bowel gas pattern. IMPRESSION: Mildly increased interstitial markings throughout both lungs are new since 2015 but might be chronic. Consider also viral/atypical respiratory infection. Otherwise no acute cardiopulmonary abnormality. Electronically Signed   By: Genevie Ann M.D.   On: 05/14/2020 09:55    ECG & Cardiac Imaging    7/3 - RSR, 71, no acute ST/T changes - personally reviewed.  Assessment & Plan    1.  Chest pain of uncertain etiology: Patient presented to the emergency department on July 3 with a 3-week history of intermittent left-sided chest discomfort which is initially dull in nature but becomes sharp, moving to the left axilla, and is associated with dyspnea.  Symptoms last between 10 to 20 minutes are typically occurring  at rest.  Symptoms do not change  with deep breathing, palpation, with position changes.  She had a prolonged episode on the day of admission associated with nausea and vomiting.  Troponins have been completely normal.  D-dimer is minimally elevated at 0.55 though she is hemodynamically stable, without dyspnea, not hypoxic.  She has had elevated heart rates on telemetry with a resting rate in the 90s and frequent mild elevations in the low 100s.  TSH was normal.  She has already eaten breakfast this morning.  We will obtain a 2D echocardiogram to evaluate LV and RV function.  With mild tachycardia, would have a low threshold to pursue CTA of the chest if RV dysfunction or elevated pulmonary pressures noted on echo.  Otherwise, provided that EF is normal, we will plan for a Lexiscan Myoview in the a.m.  It is notable that she uses ibuprofen 400 mg twice daily times several months in the setting of right hip arthritis.  Continue PPI therapy which was started this hospitalization.  2.  Essential hypertension: Patient had prior diagnosis but was not on medications.  She was just started on lisinopril HCTZ last week at an urgent care visit.  Pressure stable this morning.  3.  Sinus tachycardia: In the setting of above, patient's rates have been elevated.  We will follow-up echo to rule out LV dysfunction and also evaluate RV function/PA pressures given mild elevation of D-dimer.  4.  Urinary tract infection: Diagnosed last week.  Antibiotics per internal medicine.  Signed, Murray Hodgkins, NP 05/19/2020, 10:34 AM  For questions or updates, please contact   Please consult [email or web address] for contact info under Cardiology/STEMI.    Attending note:  Patient seen and examined.  I reviewed her records and discussed the case with Mr. Stefan Church, I agree with his above findings.  Ms. Saling presents with a history of hypertension and previous chest pain with negative GXT in 2015.  Is been an intermittent sharp  left-sided chest discomfort over the last 3 weeks, often associated with shortness of breath, usually lasts for about 15 minutes and has not changed with any specific position or intervention.  She had nausea and emesis with the presenting episode.  Heart rate has been up as well with sinus tachycardia by telemetry, not hypoxic however, TSH normal, and the D-dimer only minimally increased at 0.55 with still relatively low suspicion for pulmonary embolus.  On examination she is in no distress.  Systolic blood pressure 1 15-1 30, heart rate 90s to low 100s in sinus rhythm, afebrile.  Lungs are clear.  Cardiac exam reveals RRR without gallop or rub.  Pertinent lab work includes potassium 3.9, creatinine 1.05, normal AST and ALT, high-sensitivity troponin I levels of 3 and 4, LDL 135, WBC 11.4, hemoglobin 12.9, D-dimer 0.55, TSH 1.125.  Lower extremity venous Dopplers negative for DVT.  Chest x-ray reports no acute findings.  I personally reviewed her ECG which shows sinus rhythm with nonspecific ST changes.  Recurrent chest pain, etiology not entirely clear as yet.  No definite evidence of ACS as described above.  Echocardiogram planned to assess cardiac structure and function.  If any concern for RV dysfunction or pulmonary hypertension, would check chest CTA.  Otherwise we will schedule a Lexiscan Myoview for ischemic evaluation tomorrow.  Satira Sark, M.D., F.A.C.C.

## 2020-05-19 NOTE — Progress Notes (Signed)
Bilateral lower extremity venous duplex has been completed. Preliminary results can be found in CV Proc through chart review.   05/19/20 11:16 AM Teresa Keller RVT

## 2020-05-20 ENCOUNTER — Observation Stay (HOSPITAL_COMMUNITY): Payer: BC Managed Care – PPO

## 2020-05-20 DIAGNOSIS — R079 Chest pain, unspecified: Secondary | ICD-10-CM | POA: Diagnosis not present

## 2020-05-20 LAB — BASIC METABOLIC PANEL
Anion gap: 9 (ref 5–15)
BUN: 21 mg/dL (ref 8–23)
CO2: 27 mmol/L (ref 22–32)
Calcium: 9.6 mg/dL (ref 8.9–10.3)
Chloride: 97 mmol/L — ABNORMAL LOW (ref 98–111)
Creatinine, Ser: 1.07 mg/dL — ABNORMAL HIGH (ref 0.44–1.00)
GFR calc Af Amer: 58 mL/min — ABNORMAL LOW (ref >60)
GFR calc non Af Amer: 50 mL/min — ABNORMAL LOW (ref >60)
Glucose, Bld: 118 mg/dL — ABNORMAL HIGH (ref 70–99)
Potassium: 3.8 mmol/L (ref 3.5–5.1)
Sodium: 133 mmol/L — ABNORMAL LOW (ref 135–145)

## 2020-05-20 MED ORDER — PANTOPRAZOLE SODIUM 40 MG PO TBEC: 40.0000 mg | DELAYED_RELEASE_TABLET | Freq: Two times a day (BID) | ORAL | 0 refills | Status: AC

## 2020-05-20 MED ORDER — SIMVASTATIN 20 MG PO TABS: 20.0000 mg | ORAL_TABLET | Freq: Every day | ORAL | 0 refills | Status: DC

## 2020-05-20 MED ORDER — TECHNETIUM TC 99M TETROFOSMIN IV KIT
10.0000 | PACK | Freq: Once | INTRAVENOUS | Status: AC | PRN
  Administered 2020-05-20: 10 via INTRAVENOUS

## 2020-05-20 MED ORDER — TECHNETIUM TC 99M TETROFOSMIN IV KIT
30.0000 | PACK | Freq: Once | INTRAVENOUS | Status: AC | PRN
  Administered 2020-05-20: 30 via INTRAVENOUS

## 2020-05-20 MED ORDER — REGADENOSON 0.4 MG/5ML IV SOLN
INTRAVENOUS | Status: AC
  Administered 2020-05-20: 0.4 mg via INTRAVENOUS
  Filled 2020-05-20: qty 5

## 2020-05-20 MED ORDER — PENICILLIN V POTASSIUM 250 MG PO TABS: 250.0000 mg | ORAL_TABLET | Freq: Three times a day (TID) | ORAL | 0 refills | Status: AC

## 2020-05-20 NOTE — Progress Notes (Signed)
  Lexiscan Myoview Pharmacologic stress portion completed. Images are currently pending. Signed,  Richardson Dopp, PA-C   05/20/2020 9:29 AM

## 2020-05-20 NOTE — Discharge Summary (Signed)
Physician Discharge Summary  Teresa Keller:[id number] DOB: Apr 07, 1941 DOA: 05/18/2020  PCP: Nolene Ebbs, MD  Admit date: 05/18/2020 Discharge date: 05/20/2020  Admitted From: Home  Disposition:  Home   Recommendations for Outpatient Follow-up:  1. Follow up with PCP in 1-2 weeks 2. Please obtain BMP/CBC in one week    Discharge Condition: Stable.  CODE STATUS: full code Diet recommendation: Heart Healthy  Brief/Interim Summary: [age]-year-old with past medical history significant for arthritis, common bile duct stone, diverticulitis who presents complaining of chest pain, while at rest 7 out of 10 lasted 3 to 5 minutes associated with shortness of breath and palpitation.  Also reported chest pain exacerbated with moving around. Was recently diagnosed with a UTI, elevated blood pressure and is started on hydrochlorothiazide.  Evaluation in the ED; first set of troponin negative.   1-Chest pain: Multiples risk factors; HLD, Obesity, 79 year old, HTN.  Follow echocardiogram; normal EF, hyperdynamic function, no wall motion abnormalities.  Troponin  negative. D-dimer 0.55, elevated 0.5 point. She is chest pain-free, no hypoxic. Liver function test normal. TSH normal 1.1, lipase normal, liver function test normal EKG: some ST changes abnormalities.  Cardiology consulted, plan for stress test today. Home is stress test is normal. Trial of PPI.   2-UTI: Growing Corynebacterium.  Will change ciprofloxacin to penicillin. Received 2 days, discharge on one more day.   3-Hypertension: Continue with hydrochlorothiazide lisinopril  4-Mild elevated D dimer; Doppler negative.  5-HLD: LDL 135: Started  statins 6-Mild hyponatremia: Monitor on hydrochlorothiazide Estimated body mass index is 28.64 kg/m as calculated from the following:   Height as of this encounter: 5\' 2"  (1.575 m).   Weight as of this encounter: 71 kg.   Discharge Diagnoses:  Active Problems:   Chest pain  at rest   Left-sided chest pain    Discharge Instructions   Allergies as of 05/20/2020      Reactions   Codeine Nausea And Vomiting   Sulfur Nausea And Vomiting      Medication List    STOP taking these medications   ciprofloxacin 500 MG tablet Commonly known as: CIPRO   ibuprofen 200 MG tablet Commonly known as: ADVIL     TAKE these medications   albuterol 108 (90 Base) MCG/ACT inhaler Commonly known as: VENTOLIN HFA Inhale 1-2 puffs into the lungs every 6 (six) hours as needed for wheezing or shortness of breath.   lisinopril-hydrochlorothiazide 10-12.5 MG tablet Commonly known as: ZESTORETIC Take 1 tablet by mouth daily.   pantoprazole 40 MG tablet Commonly known as: PROTONIX Take 1 tablet (40 mg total) by mouth 2 (two) times daily.   penicillin v potassium 250 MG tablet Commonly known as: VEETID Take 1 tablet (250 mg total) by mouth every 8 (eight) hours for 1 day.   simvastatin 20 MG tablet Commonly known as: ZOCOR Take 1 tablet (20 mg total) by mouth daily at 6 PM.       Allergies  Allergen Reactions  . Codeine Nausea And Vomiting  . Sulfur Nausea And Vomiting    Consultations:  Cardiology    Procedures/Studies: DG Chest 2 View  Result Date: 05/18/2020 CLINICAL DATA:  Center left side chest pain for 5 days. EXAM: CHEST - 2 VIEW COMPARISON:  PA and lateral chest 05/14/2020 FINDINGS: Lungs clear. Heart size normal. No pneumothorax or pleural fluid. No acute or focal bony abnormality. IMPRESSION: No acute disease. Electronically Signed   By: Inge Rise M.D.   On: 05/18/2020 14:47  DG Chest 2 View  Result Date: 05/14/2020 CLINICAL DATA:  79 year old female with chest pain since last night, left upper quadrant pain. EXAM: CHEST - 2 VIEW COMPARISON:  Chest radiographs 12/24/2013. FINDINGS: Lung volumes and mediastinal contours remain normal. Mild diffuse increased interstitial markings have developed since 2015. No superimposed pneumothorax,  pulmonary edema, pleural effusion or confluent pulmonary opacity. No acute osseous abnormality identified. Chronic scoliosis. Stable cholecystectomy clips. Negative visible bowel gas pattern. IMPRESSION: Mildly increased interstitial markings throughout both lungs are new since 2015 but might be chronic. Consider also viral/atypical respiratory infection. Otherwise no acute cardiopulmonary abnormality. Electronically Signed   By: Genevie Ann M.D.   On: 05/14/2020 09:55   ECHOCARDIOGRAM COMPLETE  Result Date: 05/19/2020    ECHOCARDIOGRAM REPORT   Patient Name:   Teresa Keller Date of Exam: 05/19/2020 Medical Rec #:  [id number]        Height:       62.0 in Accession #:    [id number]       Weight:       156.6 lb Date of Birth:  19-May-1941        BSA:          1.723 m Patient Age:    12 years         BP:           130/75 mmHg Patient Gender: F                HR:           94 bpm. Exam Location:  Inpatient Procedure: 2D Echo, Cardiac Doppler and Color Doppler Indications:    Chest pain  History:        Patient has no prior history of Echocardiogram examinations.                 Risk Factors:Hypertension.  Sonographer:    Clayton Lefort RDCS (AE) Referring Phys: [id number] [place]  1. Vigorous LV systolic function; mild proximal septal thickening; grade 1 diastolic dysfunction; mild LVOT gradient of 1.8 m/s likely related to vigorous LV function.  2. Left ventricular ejection fraction, by estimation, is 70 to 75%. The left ventricle has hyperdynamic function. The left ventricle has no regional wall motion abnormalities. There is mild left ventricular hypertrophy of the basal-septal segment. Left ventricular diastolic parameters are consistent with Grade I diastolic dysfunction (impaired relaxation).  3. Right ventricular systolic function is normal. The right ventricular size is normal. There is normal pulmonary artery systolic pressure.  4. The mitral valve is normal in structure. Trivial mitral valve  regurgitation. No evidence of mitral stenosis.  5. The aortic valve is tricuspid. Aortic valve regurgitation is not visualized. Mild aortic valve sclerosis is present, with no evidence of aortic valve stenosis.  6. The inferior vena cava is normal in size with greater than 50% respiratory variability, suggesting right atrial pressure of 3 mmHg. FINDINGS  Left Ventricle: Left ventricular ejection fraction, by estimation, is 70 to 75%. The left ventricle has hyperdynamic function. The left ventricle has no regional wall motion abnormalities. The left ventricular internal cavity size was normal in size. There is mild left ventricular hypertrophy of the basal-septal segment. Left ventricular diastolic parameters are consistent with Grade I diastolic dysfunction (impaired relaxation). Right Ventricle: The right ventricular size is normal. Right ventricular systolic function is normal. There is normal pulmonary artery systolic pressure. The tricuspid regurgitant velocity is 2.25 m/s, and with an assumed right atrial pressure of  3 mmHg,  the estimated right ventricular systolic pressure is 70.2 mmHg. Left Atrium: Left atrial size was normal in size. Right Atrium: Right atrial size was normal in size. Pericardium: There is no evidence of pericardial effusion. Mitral Valve: The mitral valve is normal in structure. Normal mobility of the mitral valve leaflets. Mild mitral annular calcification. Trivial mitral valve regurgitation. No evidence of mitral valve stenosis. MV peak gradient, 3.4 mmHg. The mean mitral valve gradient is 1.0 mmHg. Tricuspid Valve: The tricuspid valve is normal in structure. Tricuspid valve regurgitation is trivial. No evidence of tricuspid stenosis. Aortic Valve: The aortic valve is tricuspid. Aortic valve regurgitation is not visualized. Mild aortic valve sclerosis is present, with no evidence of aortic valve stenosis. Aortic valve mean gradient measures 6.0 mmHg. Aortic valve peak gradient measures  8.4 mmHg. Aortic valve area, by VTI measures 2.16 cm. Pulmonic Valve: The pulmonic valve was not well visualized. Pulmonic valve regurgitation is not visualized. No evidence of pulmonic stenosis. Aorta: The aortic root is normal in size and structure. Venous: The inferior vena cava is normal in size with greater than 50% respiratory variability, suggesting right atrial pressure of 3 mmHg. IAS/Shunts: No atrial level shunt detected by color flow Doppler. Additional Comments: Vigorous LV systolic function; mild proximal septal thickening; grade 1 diastolic dysfunction; mild LVOT gradient of 1.8 m/s likely related to vigorous LV function.  LEFT VENTRICLE PLAX 2D LVIDd:         3.70 cm  Diastology LVIDs:         3.00 cm  LV e' lateral:   8.48 cm/s LV PW:         1.20 cm  LV E/e' lateral: 7.4 LV IVS:        1.70 cm  LV e' medial:    4.58 cm/s LVOT diam:     1.70 cm  LV E/e' medial:  13.8 LV SV:         53 LV SV Index:   31 LVOT Area:     2.27 cm  RIGHT VENTRICLE            IVC RV Basal diam:  2.50 cm    IVC diam: 1.70 cm RV S prime:     9.68 cm/s TAPSE (M-mode): 1.7 cm LEFT ATRIUM             Index       RIGHT ATRIUM          Index LA diam:        2.80 cm 1.63 cm/m  RA Area:     9.69 cm LA Vol (A2C):   22.9 ml 13.29 ml/m RA Volume:   18.20 ml 10.56 ml/m LA Vol (A4C):   18.4 ml 10.68 ml/m LA Biplane Vol: 21.6 ml 12.54 ml/m  AORTIC VALVE AV Area (Vmax):    1.83 cm AV Area (Vmean):   1.73 cm AV Area (VTI):     2.16 cm AV Vmax:           145.00 cm/s AV Vmean:          115.000 cm/s AV VTI:            0.245 m AV Peak Grad:      8.4 mmHg AV Mean Grad:      6.0 mmHg LVOT Vmax:         117.00 cm/s LVOT Vmean:        87.500 cm/s LVOT VTI:  0.233 m LVOT/AV VTI ratio: 0.95  AORTA Ao Root diam: 2.50 cm MITRAL VALVE               TRICUSPID VALVE MV Area (PHT): 2.11 cm    TR Peak grad:   20.2 mmHg MV Peak grad:  3.4 mmHg    TR Vmax:        225.00 cm/s MV Mean grad:  1.0 mmHg MV Vmax:       0.92 m/s    SHUNTS MV  Vmean:      55.3 cm/s   Systemic VTI:  0.23 m MV Decel Time: 359 msec    Systemic Diam: 1.70 cm MV E velocity: 63.00 cm/s MV A velocity: 77.10 cm/s MV E/A ratio:  0.82 Kirk Ruths MD Electronically signed by Kirk Ruths MD Signature Date/Time: 05/19/2020/11:59:48 AM    Final    VAS [place] LOWER EXTREMITY VENOUS (DVT)  Result Date: 05/19/2020  Lower Venous DVTStudy Indications: Edema.  Risk Factors: None identified. Comparison Study: No prior studies. Performing Technologist: Oliver Hum RVT  Examination Guidelines: A complete evaluation includes B-mode imaging, spectral Doppler, color Doppler, and power Doppler as needed of all accessible portions of each vessel. Bilateral testing is considered an integral part of a complete examination. Limited examinations for reoccurring indications may be performed as noted. The reflux portion of the exam is performed with the patient in reverse Trendelenburg.  +---------+---------------+---------+-----------+----------+--------------+ RIGHT    CompressibilityPhasicitySpontaneityPropertiesThrombus Aging +---------+---------------+---------+-----------+----------+--------------+ CFV      Full           Yes      Yes                                 +---------+---------------+---------+-----------+----------+--------------+ SFJ      Full                                                        +---------+---------------+---------+-----------+----------+--------------+ FV Prox  Full                                                        +---------+---------------+---------+-----------+----------+--------------+ FV Mid   Full                                                        +---------+---------------+---------+-----------+----------+--------------+ FV DistalFull                                                        +---------+---------------+---------+-----------+----------+--------------+ PFV      Full                                                         +---------+---------------+---------+-----------+----------+--------------+  POP      Full           Yes      Yes                                 +---------+---------------+---------+-----------+----------+--------------+ PTV      Full                                                        +---------+---------------+---------+-----------+----------+--------------+ PERO     Full                                                        +---------+---------------+---------+-----------+----------+--------------+   +---------+---------------+---------+-----------+----------+--------------+ LEFT     CompressibilityPhasicitySpontaneityPropertiesThrombus Aging +---------+---------------+---------+-----------+----------+--------------+ CFV      Full           Yes      Yes                                 +---------+---------------+---------+-----------+----------+--------------+ SFJ      Full                                                        +---------+---------------+---------+-----------+----------+--------------+ FV Prox  Full                                                        +---------+---------------+---------+-----------+----------+--------------+ FV Mid   Full                                                        +---------+---------------+---------+-----------+----------+--------------+ FV DistalFull                                                        +---------+---------------+---------+-----------+----------+--------------+ PFV      Full                                                        +---------+---------------+---------+-----------+----------+--------------+ POP      Full           Yes      Yes                                 +---------+---------------+---------+-----------+----------+--------------+  PTV      Full                                                         +---------+---------------+---------+-----------+----------+--------------+ PERO     Full                                                        +---------+---------------+---------+-----------+----------+--------------+     Summary: RIGHT: - There is no evidence of deep vein thrombosis in the lower extremity.  - No cystic structure found in the popliteal fossa.  LEFT: - There is no evidence of deep vein thrombosis in the lower extremity.  - No cystic structure found in the popliteal fossa.  *See table(s) above for measurements and observations. Electronically signed by Monica Martinez MD on 05/19/2020 at 1:18:51 PM.    Final     Chest pain free.   Subjective: Chest pain free  Discharge Exam: Vitals:   05/20/20 1442 05/20/20 1443  BP:  (!) 104/58  Pulse: 77 77  Resp:  16  Temp:  97.6 F (36.4 C)  SpO2: 99% 99%     General: Pt is alert, awake, not in acute distress Cardiovascular: RRR, S1/S2 +, no rubs, no gallops Respiratory: CTA bilaterally, no wheezing, no rhonchi Abdominal: Soft, NT, ND, bowel sounds + Extremities: no edema, no cyanosis    The results of significant diagnostics from this hospitalization (including imaging, microbiology, ancillary and laboratory) are listed below for reference.     Microbiology: Recent Results (from the past 240 hour(s))  Urine culture     Status: Abnormal   Collection Time: 05/14/20  9:38 AM   Specimen: Urine, Clean Catch  Result Value Ref Range Status   Specimen Description URINE, CLEAN CATCH  Final   Special Requests   Final    Normal Performed at [facility], [address] [place], [place] [address]    Culture (A)  Final    80,000 COLONIES/mL DIPHTHEROIDS(CORYNEBACTERIUM SPECIES)   Report Status 05/15/2020 FINAL  Final  SARS Coronavirus 2 by RT PCR (hospital order, performed in [facility] hospital lab) Nasopharyngeal Nasopharyngeal Swab     Status: None   Collection Time: 05/18/20  2:55 PM   Specimen:  Nasopharyngeal Swab  Result Value Ref Range Status   SARS Coronavirus 2 NEGATIVE NEGATIVE Final    Comment: (NOTE) SARS-CoV-2 target nucleic acids are NOT DETECTED.  The SARS-CoV-2 RNA is generally detectable in upper and lower respiratory specimens during the acute phase of infection. The lowest concentration of SARS-CoV-2 viral copies this assay can detect is 250 copies / mL. A negative result does not preclude SARS-CoV-2 infection and should not be used as the sole basis for treatment or other patient management decisions.  A negative result may occur with improper specimen collection / handling, submission of specimen other than nasopharyngeal swab, presence of viral mutation(s) within the areas targeted by this assay, and inadequate number of viral copies (<250 copies / mL). A negative result must be combined with clinical observations, patient history, and epidemiological information.  Fact Sheet for Patients:   StrictlyIdeas.no  Fact Sheet for Healthcare Providers: BankingDealers.co.za  This test is not yet approved or  cleared by the [place] and has been authorized for detection and/or diagnosis of SARS-CoV-2 by FDA under an Emergency Use Authorization (EUA).  This EUA will remain in effect (meaning this test can be used) for the duration of the COVID-19 declaration under Section 564(b)(1) of the Act, 21 U.S.C. section 360bbb-3(b)(1), unless the authorization is terminated or revoked sooner.  Performed at [facility], [place] [address] [place], [place] [address]      Labs: BNP (last 3 results) No results for input(s): BNP in the last 8760 hours. Basic Metabolic Panel: Recent Labs  Lab 05/18/20 1300 05/20/20 1044  NA 134* 133*  K 3.9 3.8  CL 100 97*  CO2 22 27  GLUCOSE 111* 118*  BUN 18 21  CREATININE 1.05* 1.07*  CALCIUM 9.5 9.6   Liver Function Tests: Recent Labs  Lab 05/19/20 0923  AST 24   ALT 17  ALKPHOS 82  BILITOT 0.6  PROT 7.5  ALBUMIN 3.4*   Recent Labs  Lab 05/19/20 0923  LIPASE 25   No results for input(s): AMMONIA in the last 168 hours. CBC: Recent Labs  Lab 05/18/20 1300  WBC 11.4*  HGB 12.9  HCT 40.6  MCV 85.8  PLT 317   Cardiac Enzymes: No results for input(s): CKTOTAL, CKMB, CKMBINDEX, TROPONINI in the last 168 hours. BNP: Invalid input(s): POCBNP CBG: No results for input(s): GLUCAP in the last 168 hours. D-Dimer Recent Labs    05/18/20 1707  DDIMER 0.55*   Hgb A1c No results for input(s): HGBA1C in the last 72 hours. Lipid Profile Recent Labs    05/18/20 1300  CHOL 201*  HDL 46  LDLCALC 135*  TRIG 99  CHOLHDL 4.4   Thyroid function studies Recent Labs    05/18/20 1924  TSH 1.125   Anemia work up No results for input(s): VITAMINB12, FOLATE, FERRITIN, TIBC, IRON, RETICCTPCT in the last 72 hours. Urinalysis    Component Value Date/Time   COLORURINE STRAW (A) 03/18/2018 2005   APPEARANCEUR CLEAR 03/18/2018 2005   LABSPEC 1.025 05/14/2020 0929   PHURINE 7.0 05/14/2020 0929   GLUCOSEU NEGATIVE 05/14/2020 0929   HGBUR NEGATIVE 05/14/2020 0929   BILIRUBINUR NEGATIVE 05/14/2020 0929   KETONESUR NEGATIVE 05/14/2020 0929   PROTEINUR NEGATIVE 05/14/2020 0929   UROBILINOGEN 0.2 05/14/2020 0929   NITRITE NEGATIVE 05/14/2020 0929   LEUKOCYTESUR TRACE (A) 05/14/2020 0929   Sepsis Labs Invalid input(s): PROCALCITONIN,  WBC,  LACTICIDVEN Microbiology Recent Results (from the past 240 hour(s))  Urine culture     Status: Abnormal   Collection Time: 05/14/20  9:38 AM   Specimen: Urine, Clean Catch  Result Value Ref Range Status   Specimen Description URINE, CLEAN CATCH  Final   Special Requests   Final    Normal Performed at [facility], [place] [address], [place], [place] [address]    Culture (A)  Final    80,000 COLONIES/mL DIPHTHEROIDS(CORYNEBACTERIUM SPECIES)   Report Status 05/15/2020 FINAL  Final  SARS  Coronavirus 2 by RT PCR (hospital order, performed in [facility] hospital lab) Nasopharyngeal Nasopharyngeal Swab     Status: None   Collection Time: 05/18/20  2:55 PM   Specimen: Nasopharyngeal Swab  Result Value Ref Range Status   SARS Coronavirus 2 NEGATIVE NEGATIVE Final    Comment: (NOTE) SARS-CoV-2 target nucleic acids are NOT DETECTED.  The SARS-CoV-2 RNA is generally detectable in upper and lower respiratory specimens during  the acute phase of infection. The lowest concentration of SARS-CoV-2 viral copies this assay can detect is 250 copies / mL. A negative result does not preclude SARS-CoV-2 infection and should not be used as the sole basis for treatment or other patient management decisions.  A negative result may occur with improper specimen collection / handling, submission of specimen other than nasopharyngeal swab, presence of viral mutation(s) within the areas targeted by this assay, and inadequate number of viral copies (<250 copies / mL). A negative result must be combined with clinical observations, patient history, and epidemiological information.  Fact Sheet for Patients:   StrictlyIdeas.no  Fact Sheet for Healthcare Providers: BankingDealers.co.za  This test is not yet approved or  cleared by the [place] FDA and has been authorized for detection and/or diagnosis of SARS-CoV-2 by FDA under an Emergency Use Authorization (EUA).  This EUA will remain in effect (meaning this test can be used) for the duration of the COVID-19 declaration under Section 564(b)(1) of the Act, 21 U.S.C. section 360bbb-3(b)(1), unless the authorization is terminated or revoked sooner.  Performed at [facility], [place] [address] [place], [place] [address]      Time coordinating discharge: 40 minutes  SIGNED:   Elmarie Shiley, MD  [facility]

## 2020-05-20 NOTE — Progress Notes (Signed)
Progress Note  Patient Name: Teresa Keller Date of Encounter: 05/20/2020  [facility] Cardiologist: Candee Furbish, MD   Subjective   Patient seen in nuclear medicine suite pre-lexiscan myoview stress test. No chest pain since admission.  Inpatient Medications    Scheduled Meds: . capsaicin   Topical BID  . enoxaparin (LOVENOX) injection  40 mg Subcutaneous Q24H  . hydrochlorothiazide  12.5 mg Oral Daily  . lisinopril  10 mg Oral Daily  . pantoprazole  40 mg Oral BID  . penicillin v potassium  250 mg Oral Q8H  . regadenoson  0.4 mg Intravenous Once  . simvastatin  20 mg Oral q1800   Continuous Infusions:  PRN Meds: acetaminophen **OR** acetaminophen, albuterol   Vital Signs    Vitals:   05/19/20 1648 05/19/20 2029 05/20/20 0623 05/20/20 0855  BP: 120/86 (!) 107/55 (!) 100/58 (!) 119/52  Pulse: (!) 106 82 79   Resp: 20 18 15    Temp: 98 F (36.7 C) 97.6 F (36.4 C)    TempSrc: Oral Oral    SpO2: 97% 95% 97%   Weight:      Height:        Intake/Output Summary (Last 24 hours) at 05/20/2020 0915 Last data filed at 05/19/2020 1000 Gross per 24 hour  Intake 220 ml  Output --  Net 220 ml   Last 3 Weights 05/19/2020 05/18/2020 05/18/2020  Weight (lbs) 156 lb 9.6 oz 158 lb 154 lb  Weight (kg) 71.033 kg 71.668 kg 69.854 kg      Telemetry    SR in 70s - Personally Reviewed  ECG    ECG pre stress reviewed, NSR. - Personally Reviewed  Physical Exam   GEN: No acute distress.   Neck: No JVD Cardiac: RRR, no murmurs, rubs, or gallops.  Respiratory: Clear to auscultation bilaterally. GI: Soft, nontender, non-distended  MS: No edema; No deformity. Neuro:  Nonfocal  Psych: Normal affect   Labs    High Sensitivity Troponin:   Recent Labs  Lab 05/18/20 1300 05/18/20 1455 05/18/20 2036  TROPONINIHS 3 3 4       Chemistry Recent Labs  Lab 05/18/20 1300 05/19/20 0923  NA 134*  --   K 3.9  --   CL 100  --   CO2 22  --   GLUCOSE 111*  --   BUN 18  --    CREATININE 1.05*  --   CALCIUM 9.5  --   PROT  --  7.5  ALBUMIN  --  3.4*  AST  --  24  ALT  --  17  ALKPHOS  --  82  BILITOT  --  0.6  GFRNONAA 51*  --   GFRAA 59*  --   ANIONGAP 12  --      Hematology Recent Labs  Lab 05/18/20 1300  WBC 11.4*  RBC 4.73  HGB 12.9  HCT 40.6  MCV 85.8  MCH 27.3  MCHC 31.8  RDW 15.4  PLT 317    BNPNo results for input(s): BNP, PROBNP in the last 168 hours.   DDimer  Recent Labs  Lab 05/18/20 1707  DDIMER 0.55*     Radiology    DG Chest 2 View  Result Date: 05/18/2020 CLINICAL DATA:  Center left side chest pain for 5 days. EXAM: CHEST - 2 VIEW COMPARISON:  PA and lateral chest 05/14/2020 FINDINGS: Lungs clear. Heart size normal. No pneumothorax or pleural fluid. No acute or focal bony abnormality. IMPRESSION: No acute  disease. Electronically Signed   By: Inge Rise M.D.   On: 05/18/2020 14:47   ECHOCARDIOGRAM COMPLETE  Result Date: 05/19/2020    ECHOCARDIOGRAM REPORT   Patient Name:   Teresa Keller Date of Exam: 05/19/2020 Medical Rec #:  [id number]        Height:       62.0 in Accession #:    [id number]       Weight:       156.6 lb Date of Birth:  [birth year]        BSA:          1.723 m Patient Age:    79 years         BP:           130/75 mmHg Patient Gender: F                HR:           94 bpm. Exam Location:  Inpatient Procedure: 2D Echo, Cardiac Doppler and Color Doppler Indications:    Chest pain  History:        Patient has no prior history of Echocardiogram examinations.                 Risk Factors:Hypertension.  Sonographer:    Clayton Lefort RDCS (AE) Referring Phys: [id number] [place]  1. Vigorous LV systolic function; mild proximal septal thickening; grade 1 diastolic dysfunction; mild LVOT gradient of 1.8 m/s likely related to vigorous LV function.  2. Left ventricular ejection fraction, by estimation, is 70 to 75%. The left ventricle has hyperdynamic function. The left ventricle has no regional wall motion  abnormalities. There is mild left ventricular hypertrophy of the basal-septal segment. Left ventricular diastolic parameters are consistent with Grade I diastolic dysfunction (impaired relaxation).  3. Right ventricular systolic function is normal. The right ventricular size is normal. There is normal pulmonary artery systolic pressure.  4. The mitral valve is normal in structure. Trivial mitral valve regurgitation. No evidence of mitral stenosis.  5. The aortic valve is tricuspid. Aortic valve regurgitation is not visualized. Mild aortic valve sclerosis is present, with no evidence of aortic valve stenosis.  6. The inferior vena cava is normal in size with greater than 50% respiratory variability, suggesting right atrial pressure of 3 mmHg. FINDINGS  Left Ventricle: Left ventricular ejection fraction, by estimation, is 70 to 75%. The left ventricle has hyperdynamic function. The left ventricle has no regional wall motion abnormalities. The left ventricular internal cavity size was normal in size. There is mild left ventricular hypertrophy of the basal-septal segment. Left ventricular diastolic parameters are consistent with Grade I diastolic dysfunction (impaired relaxation). Right Ventricle: The right ventricular size is normal. Right ventricular systolic function is normal. There is normal pulmonary artery systolic pressure. The tricuspid regurgitant velocity is 2.25 m/s, and with an assumed right atrial pressure of 3 mmHg,  the estimated right ventricular systolic pressure is 20.2 mmHg. Left Atrium: Left atrial size was normal in size. Right Atrium: Right atrial size was normal in size. Pericardium: There is no evidence of pericardial effusion. Mitral Valve: The mitral valve is normal in structure. Normal mobility of the mitral valve leaflets. Mild mitral annular calcification. Trivial mitral valve regurgitation. No evidence of mitral valve stenosis. MV peak gradient, 3.4 mmHg. The mean mitral valve gradient is  1.0 mmHg. Tricuspid Valve: The tricuspid valve is normal in structure. Tricuspid valve regurgitation is trivial. No evidence of tricuspid stenosis. Aortic  Valve: The aortic valve is tricuspid. Aortic valve regurgitation is not visualized. Mild aortic valve sclerosis is present, with no evidence of aortic valve stenosis. Aortic valve mean gradient measures 6.0 mmHg. Aortic valve peak gradient measures 8.4 mmHg. Aortic valve area, by VTI measures 2.16 cm. Pulmonic Valve: The pulmonic valve was not well visualized. Pulmonic valve regurgitation is not visualized. No evidence of pulmonic stenosis. Aorta: The aortic root is normal in size and structure. Venous: The inferior vena cava is normal in size with greater than 50% respiratory variability, suggesting right atrial pressure of 3 mmHg. IAS/Shunts: No atrial level shunt detected by color flow Doppler. Additional Comments: Vigorous LV systolic function; mild proximal septal thickening; grade 1 diastolic dysfunction; mild LVOT gradient of 1.8 m/s likely related to vigorous LV function.  LEFT VENTRICLE PLAX 2D LVIDd:         3.70 cm  Diastology LVIDs:         3.00 cm  LV e' lateral:   8.48 cm/s LV PW:         1.20 cm  LV E/e' lateral: 7.4 LV IVS:        1.70 cm  LV e' medial:    4.58 cm/s LVOT diam:     1.70 cm  LV E/e' medial:  13.8 LV SV:         53 LV SV Index:   31 LVOT Area:     2.27 cm  RIGHT VENTRICLE            IVC RV Basal diam:  2.50 cm    IVC diam: 1.70 cm RV S prime:     9.68 cm/s TAPSE (M-mode): 1.7 cm LEFT ATRIUM             Index       RIGHT ATRIUM          Index LA diam:        2.80 cm 1.63 cm/m  RA Area:     9.69 cm LA Vol (A2C):   22.9 ml 13.29 ml/m RA Volume:   18.20 ml 10.56 ml/m LA Vol (A4C):   18.4 ml 10.68 ml/m LA Biplane Vol: 21.6 ml 12.54 ml/m  AORTIC VALVE AV Area (Vmax):    1.83 cm AV Area (Vmean):   1.73 cm AV Area (VTI):     2.16 cm AV Vmax:           145.00 cm/s AV Vmean:          115.000 cm/s AV VTI:            0.245 m AV Peak  Grad:      8.4 mmHg AV Mean Grad:      6.0 mmHg LVOT Vmax:         117.00 cm/s LVOT Vmean:        87.500 cm/s LVOT VTI:          0.233 m LVOT/AV VTI ratio: 0.95  AORTA Ao Root diam: 2.50 cm MITRAL VALVE               TRICUSPID VALVE MV Area (PHT): 2.11 cm    TR Peak grad:   20.2 mmHg MV Peak grad:  3.4 mmHg    TR Vmax:        225.00 cm/s MV Mean grad:  1.0 mmHg MV Vmax:       0.92 m/s    SHUNTS MV Vmean:      55.3 cm/s   Systemic VTI:  0.23 m MV Decel Time: 359 msec    Systemic Diam: 1.70 cm MV E velocity: 63.00 cm/s MV A velocity: 77.10 cm/s MV E/A ratio:  0.82 Kirk Ruths MD Electronically signed by Kirk Ruths MD Signature Date/Time: 05/19/2020/11:59:48 AM    Final    VAS [place] LOWER EXTREMITY VENOUS (DVT)  Result Date: 05/19/2020  Lower Venous DVTStudy Indications: Edema.  Risk Factors: None identified. Comparison Study: No prior studies. Performing Technologist: Oliver Hum RVT  Examination Guidelines: A complete evaluation includes B-mode imaging, spectral Doppler, color Doppler, and power Doppler as needed of all accessible portions of each vessel. Bilateral testing is considered an integral part of a complete examination. Limited examinations for reoccurring indications may be performed as noted. The reflux portion of the exam is performed with the patient in reverse Trendelenburg.  +---------+---------------+---------+-----------+----------+--------------+ RIGHT    CompressibilityPhasicitySpontaneityPropertiesThrombus Aging +---------+---------------+---------+-----------+----------+--------------+ CFV      Full           Yes      Yes                                 +---------+---------------+---------+-----------+----------+--------------+ SFJ      Full                                                        +---------+---------------+---------+-----------+----------+--------------+ FV Prox  Full                                                         +---------+---------------+---------+-----------+----------+--------------+ FV Mid   Full                                                        +---------+---------------+---------+-----------+----------+--------------+ FV DistalFull                                                        +---------+---------------+---------+-----------+----------+--------------+ PFV      Full                                                        +---------+---------------+---------+-----------+----------+--------------+ POP      Full           Yes      Yes                                 +---------+---------------+---------+-----------+----------+--------------+ PTV      Full                                                        +---------+---------------+---------+-----------+----------+--------------+  PERO     Full                                                        +---------+---------------+---------+-----------+----------+--------------+   +---------+---------------+---------+-----------+----------+--------------+ LEFT     CompressibilityPhasicitySpontaneityPropertiesThrombus Aging +---------+---------------+---------+-----------+----------+--------------+ CFV      Full           Yes      Yes                                 +---------+---------------+---------+-----------+----------+--------------+ SFJ      Full                                                        +---------+---------------+---------+-----------+----------+--------------+ FV Prox  Full                                                        +---------+---------------+---------+-----------+----------+--------------+ FV Mid   Full                                                        +---------+---------------+---------+-----------+----------+--------------+ FV DistalFull                                                         +---------+---------------+---------+-----------+----------+--------------+ PFV      Full                                                        +---------+---------------+---------+-----------+----------+--------------+ POP      Full           Yes      Yes                                 +---------+---------------+---------+-----------+----------+--------------+ PTV      Full                                                        +---------+---------------+---------+-----------+----------+--------------+ PERO     Full                                                        +---------+---------------+---------+-----------+----------+--------------+  Summary: RIGHT: - There is no evidence of deep vein thrombosis in the lower extremity.  - No cystic structure found in the popliteal fossa.  LEFT: - There is no evidence of deep vein thrombosis in the lower extremity.  - No cystic structure found in the popliteal fossa.  *See table(s) above for measurements and observations. Electronically signed by Monica Martinez MD on 05/19/2020 at 1:18:51 PM.    Final     Cardiac Studies   Echo 05/19/20 1. Vigorous LV systolic function; mild proximal septal thickening; grade  1 diastolic dysfunction; mild LVOT gradient of 1.8 m/s likely related to  vigorous LV function.  2. Left ventricular ejection fraction, by estimation, is 70 to 75%. The  left ventricle has hyperdynamic function. The left ventricle has no  regional wall motion abnormalities. There is mild left ventricular  hypertrophy of the basal-septal segment. Left  ventricular diastolic parameters are consistent with Grade I diastolic  dysfunction (impaired relaxation).  3. Right ventricular systolic function is normal. The right ventricular  size is normal. There is normal pulmonary artery systolic pressure.  4. The mitral valve is normal in structure. Trivial mitral valve  regurgitation. No evidence of mitral stenosis.  5.  The aortic valve is tricuspid. Aortic valve regurgitation is not  visualized. Mild aortic valve sclerosis is present, with no evidence of  aortic valve stenosis.  6. The inferior vena cava is normal in size with greater than 50%  respiratory variability, suggesting right atrial pressure of 3 mmHg.   Patient Profile     Teresa Keller is a 79 y.o. female with a history of HTN, diverticulitis, CBD stone, OA, seen today for chest pain.  Assessment & Plan    1. Chest pain - undergoing Stress myoview today - results pending.  2. HTN - BP normal to low today, recently started lisinopril HCTZ.    3. Sinus tachycardia - resolved, rates in 70s currently. Patient appears comfortable.   4. UTI - abx per medicine, treatment may contribute to improvement in HR.     For questions or updates, please contact [place] Please consult [email or web address] for contact info under        Signed, Elouise Munroe, MD  05/20/2020, 9:15 AM

## 2020-05-21 ENCOUNTER — Telehealth: Payer: Self-pay | Admitting: Interventional Cardiology

## 2020-05-21 DIAGNOSIS — R079 Chest pain, unspecified: Secondary | ICD-10-CM | POA: Diagnosis not present

## 2020-05-21 DIAGNOSIS — R9431 Abnormal electrocardiogram [ECG] [EKG]: Secondary | ICD-10-CM | POA: Diagnosis not present

## 2020-05-21 LAB — NM MYOCAR MULTI W/SPECT W/WALL MOTION / EF
Peak HR: 112 {beats}/min
Rest HR: 69 {beats}/min

## 2020-05-21 MED ORDER — LISINOPRIL 10 MG PO TABS: 10.0000 mg | ORAL_TABLET | Freq: Every day | ORAL | 3 refills | Status: DC

## 2020-05-21 NOTE — Discharge Summary (Signed)
Physician Discharge Summary  Teresa Keller MVH:[id number] DOB: October 13, 1941 DOA: 05/18/2020  PCP: Nolene Ebbs, MD  Admit date: 05/18/2020 Discharge date: 05/21/2020  Admitted From: Home  Disposition:  Home   Recommendations for Outpatient Follow-up:  1. Follow up with PCP in 1-2 weeks 2. Please obtain BMP/CBC in one week    Discharge Condition: Stable.  CODE STATUS: full code Diet recommendation: Heart Healthy  Brief/Interim Summary: [age]-year-old with past medical history significant for arthritis, common bile duct stone, diverticulitis who presents complaining of chest pain, while at rest 7 out of 10 lasted 3 to 5 minutes associated with shortness of breath and palpitation.  Also reported chest pain exacerbated with moving around. Was recently diagnosed with a UTI, elevated blood pressure and is started on hydrochlorothiazide.  Evaluation in the ED; first set of troponin negative.   1-Chest pain: Multiples risk factors; HLD, Obesity, 79 year old, HTN.  Follow echocardiogram; normal EF, hyperdynamic function, no wall motion abnormalities.  Troponin  negative. D-dimer 0.55, elevated 0.5 point. She is chest pain-free, no hypoxic. Liver function test normal. TSH normal 1.1, lipase normal, liver function test normal EKG: some ST changes abnormalities.  Stress test reviewed by cardiology low risk, ok to discharge patient,.  Trial of PPI.   2-UTI: Growing Corynebacterium.  Will change ciprofloxacin to penicillin. Received 2 days, discharge on one more day.   3-Hypertension: Continue with  Lisinopril. Stop HCTZ due to hyponatremia and soft BP  4-Mild elevated D dimer; Doppler negative.  5-HLD: LDL 135: Started  statins 6-Mild hyponatremia: stop HCTA Estimated body mass index is 28.64 kg/m as calculated from the following:   Height as of this encounter: 5\' 2"  (1.575 m).   Weight as of this encounter: 71 kg.   Discharge Diagnoses:  Active Problems:   Chest pain at  rest   Left-sided chest pain   Chest pain    Discharge Instructions  Discharge Instructions    Diet - low sodium heart healthy   Complete by: As directed    Increase activity slowly   Complete by: As directed      Allergies as of 05/21/2020      Reactions   Codeine Nausea And Vomiting   Sulfur Nausea And Vomiting      Medication List    STOP taking these medications   ciprofloxacin 500 MG tablet Commonly known as: CIPRO   ibuprofen 200 MG tablet Commonly known as: ADVIL   lisinopril-hydrochlorothiazide 10-12.5 MG tablet Commonly known as: ZESTORETIC     TAKE these medications   albuterol 108 (90 Base) MCG/ACT inhaler Commonly known as: VENTOLIN HFA Inhale 1-2 puffs into the lungs every 6 (six) hours as needed for wheezing or shortness of breath.   lisinopril 10 MG tablet Commonly known as: ZESTRIL Take 1 tablet (10 mg total) by mouth daily.   pantoprazole 40 MG tablet Commonly known as: PROTONIX Take 1 tablet (40 mg total) by mouth 2 (two) times daily.   penicillin v potassium 250 MG tablet Commonly known as: VEETID Take 1 tablet (250 mg total) by mouth every 8 (eight) hours for 1 day.   simvastatin 20 MG tablet Commonly known as: ZOCOR Take 1 tablet (20 mg total) by mouth daily at 6 PM.       Allergies  Allergen Reactions  . Codeine Nausea And Vomiting  . Sulfur Nausea And Vomiting    Consultations:  Cardiology    Procedures/Studies: DG Chest 2 View  Result Date: 05/18/2020 CLINICAL DATA:  Center left side chest pain for 5 days. EXAM: CHEST - 2 VIEW COMPARISON:  PA and lateral chest 05/14/2020 FINDINGS: Lungs clear. Heart size normal. No pneumothorax or pleural fluid. No acute or focal bony abnormality. IMPRESSION: No acute disease. Electronically Signed   By: Inge Rise M.D.   On: 05/18/2020 14:47   DG Chest 2 View  Result Date: 05/14/2020 CLINICAL DATA:  79 year old female with chest pain since last night, left upper quadrant pain.  EXAM: CHEST - 2 VIEW COMPARISON:  Chest radiographs 12/24/2013. FINDINGS: Lung volumes and mediastinal contours remain normal. Mild diffuse increased interstitial markings have developed since 2015. No superimposed pneumothorax, pulmonary edema, pleural effusion or confluent pulmonary opacity. No acute osseous abnormality identified. Chronic scoliosis. Stable cholecystectomy clips. Negative visible bowel gas pattern. IMPRESSION: Mildly increased interstitial markings throughout both lungs are new since 2015 but might be chronic. Consider also viral/atypical respiratory infection. Otherwise no acute cardiopulmonary abnormality. Electronically Signed   By: Genevie Ann M.D.   On: 05/14/2020 09:55   ECHOCARDIOGRAM COMPLETE  Result Date: 05/19/2020    ECHOCARDIOGRAM REPORT   Patient Name:   Teresa Keller Date of Exam: 05/19/2020 Medical Rec #:  [id number]        Height:       62.0 in Accession #:    [id number]       Weight:       156.6 lb Date of Birth:  05/07/1941        BSA:          1.723 m Patient Age:    76 years         BP:           130/75 mmHg Patient Gender: F                HR:           94 bpm. Exam Location:  Inpatient Procedure: 2D Echo, Cardiac Doppler and Color Doppler Indications:    Chest pain  History:        Patient has no prior history of Echocardiogram examinations.                 Risk Factors:Hypertension.  Sonographer:    Clayton Lefort RDCS (AE) Referring Phys: [id number] [place]  1. Vigorous LV systolic function; mild proximal septal thickening; grade 1 diastolic dysfunction; mild LVOT gradient of 1.8 m/s likely related to vigorous LV function.  2. Left ventricular ejection fraction, by estimation, is 70 to 75%. The left ventricle has hyperdynamic function. The left ventricle has no regional wall motion abnormalities. There is mild left ventricular hypertrophy of the basal-septal segment. Left ventricular diastolic parameters are consistent with Grade I diastolic dysfunction (impaired  relaxation).  3. Right ventricular systolic function is normal. The right ventricular size is normal. There is normal pulmonary artery systolic pressure.  4. The mitral valve is normal in structure. Trivial mitral valve regurgitation. No evidence of mitral stenosis.  5. The aortic valve is tricuspid. Aortic valve regurgitation is not visualized. Mild aortic valve sclerosis is present, with no evidence of aortic valve stenosis.  6. The inferior vena cava is normal in size with greater than 50% respiratory variability, suggesting right atrial pressure of 3 mmHg. FINDINGS  Left Ventricle: Left ventricular ejection fraction, by estimation, is 70 to 75%. The left ventricle has hyperdynamic function. The left ventricle has no regional wall motion abnormalities. The left ventricular internal cavity size was normal in size. There is  mild left ventricular hypertrophy of the basal-septal segment. Left ventricular diastolic parameters are consistent with Grade I diastolic dysfunction (impaired relaxation). Right Ventricle: The right ventricular size is normal. Right ventricular systolic function is normal. There is normal pulmonary artery systolic pressure. The tricuspid regurgitant velocity is 2.25 m/s, and with an assumed right atrial pressure of 3 mmHg,  the estimated right ventricular systolic pressure is 19.3 mmHg. Left Atrium: Left atrial size was normal in size. Right Atrium: Right atrial size was normal in size. Pericardium: There is no evidence of pericardial effusion. Mitral Valve: The mitral valve is normal in structure. Normal mobility of the mitral valve leaflets. Mild mitral annular calcification. Trivial mitral valve regurgitation. No evidence of mitral valve stenosis. MV peak gradient, 3.4 mmHg. The mean mitral valve gradient is 1.0 mmHg. Tricuspid Valve: The tricuspid valve is normal in structure. Tricuspid valve regurgitation is trivial. No evidence of tricuspid stenosis. Aortic Valve: The aortic valve is  tricuspid. Aortic valve regurgitation is not visualized. Mild aortic valve sclerosis is present, with no evidence of aortic valve stenosis. Aortic valve mean gradient measures 6.0 mmHg. Aortic valve peak gradient measures 8.4 mmHg. Aortic valve area, by VTI measures 2.16 cm. Pulmonic Valve: The pulmonic valve was not well visualized. Pulmonic valve regurgitation is not visualized. No evidence of pulmonic stenosis. Aorta: The aortic root is normal in size and structure. Venous: The inferior vena cava is normal in size with greater than 50% respiratory variability, suggesting right atrial pressure of 3 mmHg. IAS/Shunts: No atrial level shunt detected by color flow Doppler. Additional Comments: Vigorous LV systolic function; mild proximal septal thickening; grade 1 diastolic dysfunction; mild LVOT gradient of 1.8 m/s likely related to vigorous LV function.  LEFT VENTRICLE PLAX 2D LVIDd:         3.70 cm  Diastology LVIDs:         3.00 cm  LV e' lateral:   8.48 cm/s LV PW:         1.20 cm  LV E/e' lateral: 7.4 LV IVS:        1.70 cm  LV e' medial:    4.58 cm/s LVOT diam:     1.70 cm  LV E/e' medial:  13.8 LV SV:         53 LV SV Index:   31 LVOT Area:     2.27 cm  RIGHT VENTRICLE            IVC RV Basal diam:  2.50 cm    IVC diam: 1.70 cm RV S prime:     9.68 cm/s TAPSE (M-mode): 1.7 cm LEFT ATRIUM             Index       RIGHT ATRIUM          Index LA diam:        2.80 cm 1.63 cm/m  RA Area:     9.69 cm LA Vol (A2C):   22.9 ml 13.29 ml/m RA Volume:   18.20 ml 10.56 ml/m LA Vol (A4C):   18.4 ml 10.68 ml/m LA Biplane Vol: 21.6 ml 12.54 ml/m  AORTIC VALVE AV Area (Vmax):    1.83 cm AV Area (Vmean):   1.73 cm AV Area (VTI):     2.16 cm AV Vmax:           145.00 cm/s AV Vmean:          115.000 cm/s AV VTI:  0.245 m AV Peak Grad:      8.4 mmHg AV Mean Grad:      6.0 mmHg LVOT Vmax:         117.00 cm/s LVOT Vmean:        87.500 cm/s LVOT VTI:          0.233 m LVOT/AV VTI ratio: 0.95  AORTA Ao Root diam:  2.50 cm MITRAL VALVE               TRICUSPID VALVE MV Area (PHT): 2.11 cm    TR Peak grad:   20.2 mmHg MV Peak grad:  3.4 mmHg    TR Vmax:        225.00 cm/s MV Mean grad:  1.0 mmHg MV Vmax:       0.92 m/s    SHUNTS MV Vmean:      55.3 cm/s   Systemic VTI:  0.23 m MV Decel Time: 359 msec    Systemic Diam: 1.70 cm MV E velocity: 63.00 cm/s MV A velocity: 77.10 cm/s MV E/A ratio:  0.82 Kirk Ruths MD Electronically signed by Kirk Ruths MD Signature Date/Time: 05/19/2020/11:59:48 AM    Final    VAS [place] LOWER EXTREMITY VENOUS (DVT)  Result Date: 05/19/2020  Lower Venous DVTStudy Indications: Edema.  Risk Factors: None identified. Comparison Study: No prior studies. Performing Technologist: Oliver Hum RVT  Examination Guidelines: A complete evaluation includes B-mode imaging, spectral Doppler, color Doppler, and power Doppler as needed of all accessible portions of each vessel. Bilateral testing is considered an integral part of a complete examination. Limited examinations for reoccurring indications may be performed as noted. The reflux portion of the exam is performed with the patient in reverse Trendelenburg.  +---------+---------------+---------+-----------+----------+--------------+ RIGHT    CompressibilityPhasicitySpontaneityPropertiesThrombus Aging +---------+---------------+---------+-----------+----------+--------------+ CFV      Full           Yes      Yes                                 +---------+---------------+---------+-----------+----------+--------------+ SFJ      Full                                                        +---------+---------------+---------+-----------+----------+--------------+ FV Prox  Full                                                        +---------+---------------+---------+-----------+----------+--------------+ FV Mid   Full                                                         +---------+---------------+---------+-----------+----------+--------------+ FV DistalFull                                                        +---------+---------------+---------+-----------+----------+--------------+  PFV      Full                                                        +---------+---------------+---------+-----------+----------+--------------+ POP      Full           Yes      Yes                                 +---------+---------------+---------+-----------+----------+--------------+ PTV      Full                                                        +---------+---------------+---------+-----------+----------+--------------+ PERO     Full                                                        +---------+---------------+---------+-----------+----------+--------------+   +---------+---------------+---------+-----------+----------+--------------+ LEFT     CompressibilityPhasicitySpontaneityPropertiesThrombus Aging +---------+---------------+---------+-----------+----------+--------------+ CFV      Full           Yes      Yes                                 +---------+---------------+---------+-----------+----------+--------------+ SFJ      Full                                                        +---------+---------------+---------+-----------+----------+--------------+ FV Prox  Full                                                        +---------+---------------+---------+-----------+----------+--------------+ FV Mid   Full                                                        +---------+---------------+---------+-----------+----------+--------------+ FV DistalFull                                                        +---------+---------------+---------+-----------+----------+--------------+ PFV      Full                                                         +---------+---------------+---------+-----------+----------+--------------+  POP      Full           Yes      Yes                                 +---------+---------------+---------+-----------+----------+--------------+ PTV      Full                                                        +---------+---------------+---------+-----------+----------+--------------+ PERO     Full                                                        +---------+---------------+---------+-----------+----------+--------------+     Summary: RIGHT: - There is no evidence of deep vein thrombosis in the lower extremity.  - No cystic structure found in the popliteal fossa.  LEFT: - There is no evidence of deep vein thrombosis in the lower extremity.  - No cystic structure found in the popliteal fossa.  *See table(s) above for measurements and observations. Electronically signed by Monica Martinez MD on 05/19/2020 at 1:18:51 PM.    Final    Chest pain free.   Subjective: Chest pain free  Discharge Exam: Vitals:   05/20/20 2034 05/21/20 0312  BP: (!) 88/61 117/66  Pulse: 94 79  Resp: 14 15  Temp: 98.3 F (36.8 C) 98.6 F (37 C)  SpO2: 99% 98%     General: Pt is alert, awake, not in acute distress Cardiovascular: RRR, S1/S2 +, no rubs, no gallops Respiratory: CTA bilaterally, no wheezing, no rhonchi Abdominal: Soft, NT, ND, bowel sounds + Extremities: no edema, no cyanosis    The results of significant diagnostics from this hospitalization (including imaging, microbiology, ancillary and laboratory) are listed below for reference.     Microbiology: Recent Results (from the past 240 hour(s))  Urine culture     Status: Abnormal   Collection Time: 05/14/20  9:38 AM   Specimen: Urine, Clean Catch  Result Value Ref Range Status   Specimen Description URINE, CLEAN CATCH  Final   Special Requests   Final    Normal Performed at [facility], [address] [place], [place] [address]     Culture (A)  Final    80,000 COLONIES/mL DIPHTHEROIDS(CORYNEBACTERIUM SPECIES)   Report Status 05/15/2020 FINAL  Final  SARS Coronavirus 2 by RT PCR (hospital order, performed in [facility] hospital lab) Nasopharyngeal Nasopharyngeal Swab     Status: None   Collection Time: 05/18/20  2:55 PM   Specimen: Nasopharyngeal Swab  Result Value Ref Range Status   SARS Coronavirus 2 NEGATIVE NEGATIVE Final    Comment: (NOTE) SARS-CoV-2 target nucleic acids are NOT DETECTED.  The SARS-CoV-2 RNA is generally detectable in upper and lower respiratory specimens during the acute phase of infection. The lowest concentration of SARS-CoV-2 viral copies this assay can detect is 250 copies / mL. A negative result does not preclude SARS-CoV-2 infection and should not be used as the sole basis for treatment or other patient management decisions.  A negative result may occur with improper specimen collection /  handling, submission of specimen other than nasopharyngeal swab, presence of viral mutation(s) within the areas targeted by this assay, and inadequate number of viral copies (<250 copies / mL). A negative result must be combined with clinical observations, patient history, and epidemiological information.  Fact Sheet for Patients:   StrictlyIdeas.no  Fact Sheet for Healthcare Providers: BankingDealers.co.za  This test is not yet approved or  cleared by the [place] FDA and has been authorized for detection and/or diagnosis of SARS-CoV-2 by FDA under an Emergency Use Authorization (EUA).  This EUA will remain in effect (meaning this test can be used) for the duration of the COVID-19 declaration under Section 564(b)(1) of the Act, 21 U.S.C. section 360bbb-3(b)(1), unless the authorization is terminated or revoked sooner.  Performed at [facility], [place] [address], [place], [place] [id number]      Labs: BNP (last 3 results) No results  for input(s): BNP in the last 8760 hours. Basic Metabolic Panel: Recent Labs  Lab 05/18/20 1300 05/20/20 1044  NA 134* 133*  K 3.9 3.8  CL 100 97*  CO2 22 27  GLUCOSE 111* 118*  BUN 18 21  CREATININE 1.05* 1.07*  CALCIUM 9.5 9.6   Liver Function Tests: Recent Labs  Lab 05/19/20 0923  AST 24  ALT 17  ALKPHOS 82  BILITOT 0.6  PROT 7.5  ALBUMIN 3.4*   Recent Labs  Lab 05/19/20 0923  LIPASE 25   No results for input(s): AMMONIA in the last 168 hours. CBC: Recent Labs  Lab 05/18/20 1300  WBC 11.4*  HGB 12.9  HCT 40.6  MCV 85.8  PLT 317   Cardiac Enzymes: No results for input(s): CKTOTAL, CKMB, CKMBINDEX, TROPONINI in the last 168 hours. BNP: Invalid input(s): POCBNP CBG: No results for input(s): GLUCAP in the last 168 hours. D-Dimer Recent Labs    05/18/20 1707  DDIMER 0.55*   Hgb A1c No results for input(s): HGBA1C in the last 72 hours. Lipid Profile Recent Labs    05/18/20 1300  CHOL 201*  HDL 46  LDLCALC 135*  TRIG 99  CHOLHDL 4.4   Thyroid function studies Recent Labs    05/18/20 1924  TSH 1.125   Anemia work up No results for input(s): VITAMINB12, FOLATE, FERRITIN, TIBC, IRON, RETICCTPCT in the last 72 hours. Urinalysis    Component Value Date/Time   COLORURINE STRAW (A) 03/18/2018 2005   APPEARANCEUR CLEAR 03/18/2018 2005   LABSPEC 1.025 05/14/2020 0929   PHURINE 7.0 05/14/2020 0929   GLUCOSEU NEGATIVE 05/14/2020 0929   HGBUR NEGATIVE 05/14/2020 0929   BILIRUBINUR NEGATIVE 05/14/2020 0929   KETONESUR NEGATIVE 05/14/2020 0929   PROTEINUR NEGATIVE 05/14/2020 0929   UROBILINOGEN 0.2 05/14/2020 0929   NITRITE NEGATIVE 05/14/2020 0929   LEUKOCYTESUR TRACE (A) 05/14/2020 0929   Sepsis Labs Invalid input(s): PROCALCITONIN,  WBC,  LACTICIDVEN Microbiology Recent Results (from the past 240 hour(s))  Urine culture     Status: Abnormal   Collection Time: 05/14/20  9:38 AM   Specimen: Urine, Clean Catch  Result Value Ref Range  Status   Specimen Description URINE, CLEAN CATCH  Final   Special Requests   Final    Normal Performed at [facility], [place] [address] [place], [place] [address]    Culture (A)  Final    80,000 COLONIES/mL DIPHTHEROIDS(CORYNEBACTERIUM SPECIES)   Report Status 05/15/2020 FINAL  Final  SARS Coronavirus 2 by RT PCR (hospital order, performed in [facility] hospital lab) Nasopharyngeal Nasopharyngeal Swab  Status: None   Collection Time: 05/18/20  2:55 PM   Specimen: Nasopharyngeal Swab  Result Value Ref Range Status   SARS Coronavirus 2 NEGATIVE NEGATIVE Final    Comment: (NOTE) SARS-CoV-2 target nucleic acids are NOT DETECTED.  The SARS-CoV-2 RNA is generally detectable in upper and lower respiratory specimens during the acute phase of infection. The lowest concentration of SARS-CoV-2 viral copies this assay can detect is 250 copies / mL. A negative result does not preclude SARS-CoV-2 infection and should not be used as the sole basis for treatment or other patient management decisions.  A negative result may occur with improper specimen collection / handling, submission of specimen other than nasopharyngeal swab, presence of viral mutation(s) within the areas targeted by this assay, and inadequate number of viral copies (<250 copies / mL). A negative result must be combined with clinical observations, patient history, and epidemiological information.  Fact Sheet for Patients:   StrictlyIdeas.no  Fact Sheet for Healthcare Providers: BankingDealers.co.za  This test is not yet approved or  cleared by the [place] FDA and has been authorized for detection and/or diagnosis of SARS-CoV-2 by FDA under an Emergency Use Authorization (EUA).  This EUA will remain in effect (meaning this test can be used) for the duration of the COVID-19 declaration under Section 564(b)(1) of the Act, 21 U.S.C. section 360bbb-3(b)(1), unless the  authorization is terminated or revoked sooner.  Performed at [facility], [place] [address] [place], [place] [address]      Time coordinating discharge: 40 minutes  SIGNED:   Elmarie Shiley, MD  [facility]

## 2020-05-21 NOTE — Telephone Encounter (Signed)
Patient has TOC appointment with Melina Copa on 05/24/2020 at 10:15 am per staff message from [facility].

## 2020-05-21 NOTE — Progress Notes (Addendum)
Progress Note  Patient Name: Teresa Keller Date of Encounter: 05/21/2020  [facility] Cardiologist: Candee Furbish, MD   Subjective   No acute overnight events. No recurrent chest pain. She notes some mild shortness of breath with activity but patient states this is because she is not in shape. No palpitations, lightheadedness, or dizziness. Patient eager to go home.  Inpatient Medications    Scheduled Meds: . capsaicin   Topical BID  . enoxaparin (LOVENOX) injection  40 mg Subcutaneous Q24H  . hydrochlorothiazide  12.5 mg Oral Daily  . lisinopril  10 mg Oral Daily  . pantoprazole  40 mg Oral BID  . penicillin v potassium  250 mg Oral Q8H  . simvastatin  20 mg Oral q1800   Continuous Infusions:  PRN Meds: acetaminophen **OR** acetaminophen, albuterol   Vital Signs    Vitals:   05/20/20 1442 05/20/20 1443 05/20/20 2034 05/21/20 0312  BP:  (!) 104/58 (!) 88/61 117/66  Pulse: 77 77 94 79  Resp:  16 14 15   Temp:  97.6 F (36.4 C) 98.3 F (36.8 C) 98.6 F (37 C)  TempSrc:  Oral Oral Oral  SpO2: 99% 99% 99% 98%  Weight:    71.3 kg  Height:       No intake or output data in the 24 hours ending 05/21/20 0740 Last 3 Weights 05/21/2020 05/19/2020 05/18/2020  Weight (lbs) 157 lb 3.2 oz 156 lb 9.6 oz 158 lb  Weight (kg) 71.305 kg 71.033 kg 71.668 kg      Telemetry    Sinus rhythm with baseline rates in the 60's to 80's. Occasionally in the low 100's (suspect this is with activity). - Personally Reviewed  ECG    No new ECG tracing. - Personally Reviewed  Physical Exam   GEN: No acute distress.   Neck: No JVD. Cardiac: RRR. No murmurs, rubs, or gallops.  Respiratory: Clear to auscultation bilaterally. No wheezes, rhonchi, or rales. GI: Soft, non-tender, non-distended  MS: No edema. No deformity. Skin: Warm and dry. Neuro:  No focal deficits. Psych: Normal affect. Responds appropriately.  Labs    High Sensitivity Troponin:   Recent Labs  Lab 05/18/20 1300  05/18/20 1455 05/18/20 2036  TROPONINIHS 3 3 4       Chemistry Recent Labs  Lab 05/18/20 1300 05/19/20 0923 05/20/20 1044  NA 134*  --  133*  K 3.9  --  3.8  CL 100  --  97*  CO2 22  --  27  GLUCOSE 111*  --  118*  BUN 18  --  21  CREATININE 1.05*  --  1.07*  CALCIUM 9.5  --  9.6  PROT  --  7.5  --   ALBUMIN  --  3.4*  --   AST  --  24  --   ALT  --  17  --   ALKPHOS  --  82  --   BILITOT  --  0.6  --   GFRNONAA 51*  --  50*  GFRAA 59*  --  58*  ANIONGAP 12  --  9     Hematology Recent Labs  Lab 05/18/20 1300  WBC 11.4*  RBC 4.73  HGB 12.9  HCT 40.6  MCV 85.8  MCH 27.3  MCHC 31.8  RDW 15.4  PLT 317    BNPNo results for input(s): BNP, PROBNP in the last 168 hours.   DDimer  Recent Labs  Lab 05/18/20 1707  DDIMER 0.55*  Radiology    ECHOCARDIOGRAM COMPLETE  Result Date: 05/19/2020    ECHOCARDIOGRAM REPORT   Patient Name:   Teresa Keller Date of Exam: 05/19/2020 Medical Rec #:  [id number]        Height:       62.0 in Accession #:    [id number]       Weight:       156.6 lb Date of Birth:  [birth year]        BSA:          1.723 m Patient Age:    79 years         BP:           130/75 mmHg Patient Gender: F                HR:           94 bpm. Exam Location:  Inpatient Procedure: 2D Echo, Cardiac Doppler and Color Doppler Indications:    Chest pain  History:        Patient has no prior history of Echocardiogram examinations.                 Risk Factors:Hypertension.  Sonographer:    Clayton Lefort RDCS (AE) Referring Phys: [id number] [place]  1. Vigorous LV systolic function; mild proximal septal thickening; grade 1 diastolic dysfunction; mild LVOT gradient of 1.8 m/s likely related to vigorous LV function.  2. Left ventricular ejection fraction, by estimation, is 70 to 75%. The left ventricle has hyperdynamic function. The left ventricle has no regional wall motion abnormalities. There is mild left ventricular hypertrophy of the basal-septal segment.  Left ventricular diastolic parameters are consistent with Grade I diastolic dysfunction (impaired relaxation).  3. Right ventricular systolic function is normal. The right ventricular size is normal. There is normal pulmonary artery systolic pressure.  4. The mitral valve is normal in structure. Trivial mitral valve regurgitation. No evidence of mitral stenosis.  5. The aortic valve is tricuspid. Aortic valve regurgitation is not visualized. Mild aortic valve sclerosis is present, with no evidence of aortic valve stenosis.  6. The inferior vena cava is normal in size with greater than 50% respiratory variability, suggesting right atrial pressure of 3 mmHg. FINDINGS  Left Ventricle: Left ventricular ejection fraction, by estimation, is 70 to 75%. The left ventricle has hyperdynamic function. The left ventricle has no regional wall motion abnormalities. The left ventricular internal cavity size was normal in size. There is mild left ventricular hypertrophy of the basal-septal segment. Left ventricular diastolic parameters are consistent with Grade I diastolic dysfunction (impaired relaxation). Right Ventricle: The right ventricular size is normal. Right ventricular systolic function is normal. There is normal pulmonary artery systolic pressure. The tricuspid regurgitant velocity is 2.25 m/s, and with an assumed right atrial pressure of 3 mmHg,  the estimated right ventricular systolic pressure is 70.6 mmHg. Left Atrium: Left atrial size was normal in size. Right Atrium: Right atrial size was normal in size. Pericardium: There is no evidence of pericardial effusion. Mitral Valve: The mitral valve is normal in structure. Normal mobility of the mitral valve leaflets. Mild mitral annular calcification. Trivial mitral valve regurgitation. No evidence of mitral valve stenosis. MV peak gradient, 3.4 mmHg. The mean mitral valve gradient is 1.0 mmHg. Tricuspid Valve: The tricuspid valve is normal in structure. Tricuspid valve  regurgitation is trivial. No evidence of tricuspid stenosis. Aortic Valve: The aortic valve is tricuspid. Aortic valve regurgitation is not visualized. Mild  aortic valve sclerosis is present, with no evidence of aortic valve stenosis. Aortic valve mean gradient measures 6.0 mmHg. Aortic valve peak gradient measures 8.4 mmHg. Aortic valve area, by VTI measures 2.16 cm. Pulmonic Valve: The pulmonic valve was not well visualized. Pulmonic valve regurgitation is not visualized. No evidence of pulmonic stenosis. Aorta: The aortic root is normal in size and structure. Venous: The inferior vena cava is normal in size with greater than 50% respiratory variability, suggesting right atrial pressure of 3 mmHg. IAS/Shunts: No atrial level shunt detected by color flow Doppler. Additional Comments: Vigorous LV systolic function; mild proximal septal thickening; grade 1 diastolic dysfunction; mild LVOT gradient of 1.8 m/s likely related to vigorous LV function.  LEFT VENTRICLE PLAX 2D LVIDd:         3.70 cm  Diastology LVIDs:         3.00 cm  LV e' lateral:   8.48 cm/s LV PW:         1.20 cm  LV E/e' lateral: 7.4 LV IVS:        1.70 cm  LV e' medial:    4.58 cm/s LVOT diam:     1.70 cm  LV E/e' medial:  13.8 LV SV:         53 LV SV Index:   31 LVOT Area:     2.27 cm  RIGHT VENTRICLE            IVC RV Basal diam:  2.50 cm    IVC diam: 1.70 cm RV S prime:     9.68 cm/s TAPSE (M-mode): 1.7 cm LEFT ATRIUM             Index       RIGHT ATRIUM          Index LA diam:        2.80 cm 1.63 cm/m  RA Area:     9.69 cm LA Vol (A2C):   22.9 ml 13.29 ml/m RA Volume:   18.20 ml 10.56 ml/m LA Vol (A4C):   18.4 ml 10.68 ml/m LA Biplane Vol: 21.6 ml 12.54 ml/m  AORTIC VALVE AV Area (Vmax):    1.83 cm AV Area (Vmean):   1.73 cm AV Area (VTI):     2.16 cm AV Vmax:           145.00 cm/s AV Vmean:          115.000 cm/s AV VTI:            0.245 m AV Peak Grad:      8.4 mmHg AV Mean Grad:      6.0 mmHg LVOT Vmax:         117.00 cm/s LVOT  Vmean:        87.500 cm/s LVOT VTI:          0.233 m LVOT/AV VTI ratio: 0.95  AORTA Ao Root diam: 2.50 cm MITRAL VALVE               TRICUSPID VALVE MV Area (PHT): 2.11 cm    TR Peak grad:   20.2 mmHg MV Peak grad:  3.4 mmHg    TR Vmax:        225.00 cm/s MV Mean grad:  1.0 mmHg MV Vmax:       0.92 m/s    SHUNTS MV Vmean:      55.3 cm/s   Systemic VTI:  0.23 m MV Decel Time: 359 msec    Systemic Diam:  1.70 cm MV E velocity: 63.00 cm/s MV A velocity: 77.10 cm/s MV E/A ratio:  0.82 Kirk Ruths MD Electronically signed by Kirk Ruths MD Signature Date/Time: 05/19/2020/11:59:48 AM    Final    VAS [place] LOWER EXTREMITY VENOUS (DVT)  Result Date: 05/19/2020  Lower Venous DVTStudy Indications: Edema.  Risk Factors: None identified. Comparison Study: No prior studies. Performing Technologist: Oliver Hum RVT  Examination Guidelines: A complete evaluation includes B-mode imaging, spectral Doppler, color Doppler, and power Doppler as needed of all accessible portions of each vessel. Bilateral testing is considered an integral part of a complete examination. Limited examinations for reoccurring indications may be performed as noted. The reflux portion of the exam is performed with the patient in reverse Trendelenburg.  +---------+---------------+---------+-----------+----------+--------------+ RIGHT    CompressibilityPhasicitySpontaneityPropertiesThrombus Aging +---------+---------------+---------+-----------+----------+--------------+ CFV      Full           Yes      Yes                                 +---------+---------------+---------+-----------+----------+--------------+ SFJ      Full                                                        +---------+---------------+---------+-----------+----------+--------------+ FV Prox  Full                                                        +---------+---------------+---------+-----------+----------+--------------+ FV Mid   Full                                                         +---------+---------------+---------+-----------+----------+--------------+ FV DistalFull                                                        +---------+---------------+---------+-----------+----------+--------------+ PFV      Full                                                        +---------+---------------+---------+-----------+----------+--------------+ POP      Full           Yes      Yes                                 +---------+---------------+---------+-----------+----------+--------------+ PTV      Full                                                        +---------+---------------+---------+-----------+----------+--------------+  PERO     Full                                                        +---------+---------------+---------+-----------+----------+--------------+   +---------+---------------+---------+-----------+----------+--------------+ LEFT     CompressibilityPhasicitySpontaneityPropertiesThrombus Aging +---------+---------------+---------+-----------+----------+--------------+ CFV      Full           Yes      Yes                                 +---------+---------------+---------+-----------+----------+--------------+ SFJ      Full                                                        +---------+---------------+---------+-----------+----------+--------------+ FV Prox  Full                                                        +---------+---------------+---------+-----------+----------+--------------+ FV Mid   Full                                                        +---------+---------------+---------+-----------+----------+--------------+ FV DistalFull                                                        +---------+---------------+---------+-----------+----------+--------------+ PFV      Full                                                         +---------+---------------+---------+-----------+----------+--------------+ POP      Full           Yes      Yes                                 +---------+---------------+---------+-----------+----------+--------------+ PTV      Full                                                        +---------+---------------+---------+-----------+----------+--------------+ PERO     Full                                                        +---------+---------------+---------+-----------+----------+--------------+  Summary: RIGHT: - There is no evidence of deep vein thrombosis in the lower extremity.  - No cystic structure found in the popliteal fossa.  LEFT: - There is no evidence of deep vein thrombosis in the lower extremity.  - No cystic structure found in the popliteal fossa.  *See table(s) above for measurements and observations. Electronically signed by Monica Martinez MD on 05/19/2020 at 1:18:51 PM.    Final     Cardiac Studies   Echocardiogram 05/19/2020: Impressions: 1. Vigorous LV systolic function; mild proximal septal thickening; grade  1 diastolic dysfunction; mild LVOT gradient of 1.8 m/s likely related to  vigorous LV function.  2. Left ventricular ejection fraction, by estimation, is 70 to 75%. The  left ventricle has hyperdynamic function. The left ventricle has no  regional wall motion abnormalities. There is mild left ventricular  hypertrophy of the basal-septal segment. Left  ventricular diastolic parameters are consistent with Grade I diastolic  dysfunction (impaired relaxation).  3. Right ventricular systolic function is normal. The right ventricular  size is normal. There is normal pulmonary artery systolic pressure.  4. The mitral valve is normal in structure. Trivial mitral valve  regurgitation. No evidence of mitral stenosis.  5. The aortic valve is tricuspid. Aortic valve regurgitation is not  visualized. Mild aortic valve sclerosis is present, with  no evidence of  aortic valve stenosis.  6. The inferior vena cava is normal in size with greater than 50%  respiratory variability, suggesting right atrial pressure of 3 mmHg.   Patient Profile     79 y.o. female with a history of hypertension, diverticulitis, common bile duct stone, and arthritis who is being seen for further evaluation of chest pain.  Assessment & Plan    Chest Pain - No acute ischemic changes on EKG. - High-sensitivity troponin negative x3. - No recurrent chest pain. - Patient underwent nuclear stress test yesterday but final results are not back yet. Will discuss with MD. If normal, suspect she will be OK for discharge.  Hypertension - BP soft at times. Dropped as low as 88/61 last night. Most recent BP 117/66.  - Patient recently started on Lisinopril-HCTZ 10-12.5mg  prior to admission. - Does not look like she needs both medications.  - Given minimally hyponatremia, consider discharging patient on Lisinopril 10mg  alone.  Elevated D-Dimer - D-dimer minimally elevated at 0.55. - Lower extremity dopplers negative for DVT. - Will defer additional work-up for PE to primary team if felt necessary.  UTI - Continue antibiotics per primary team.   For questions or updates, please contact [place] Please consult [email or web address] for contact info under        Signed, Darreld Mclean, PA-C  05/21/2020, 7:40 AM    Personally seen and examined. Agree with above.   Sitting up in chair, feeling better.  No shortness of breath.  Chest pain -Stress test images personally reviewed-no evidence of ischemia identified.  Low risk.  Normal EF.  Reassuring work-up.  Okay for discharge from cardiology perspective.  Troponins were normal.  No ischemic changes on ECG.    Alert and oriented x3, regular rate and rhythm, lungs are clear  Hypertension -Agree with utilizing lisinopril only 10 mg on discharge.  Blood pressure has been fairly low here.  Also hyponatremia  noted.  Discussed with Dr. Tyrell Antonio.  Candee Furbish, MD

## 2020-05-22 ENCOUNTER — Encounter: Payer: Self-pay | Admitting: Physician Assistant

## 2020-05-22 NOTE — Progress Notes (Signed)
Cardiology Office Note    Date:  05/24/2020   ID:  Teresa Keller, DOB 05-Apr-1941, MRN [id number]  PCP:  Nolene Ebbs, MD  Cardiologist:  Candee Furbish, MD  Electrophysiologist:  None   Chief Complaint: f/u chest pain  History of Present Illness:   Teresa Keller is a 79 y.o. female with history of high blood pressure during episodes of pain, diverticulitis, CBD stone, OA who presents for post-hospital follow-up. In 12/2013, she was hospitalized for atypical chest pain and ruled out. She underwent ETT which showed poor exercise tolerance (4 mins) but no significant ST/T changes.  She followed up once with Dr. Marlou Porch and had not had recurrent symptoms. Over the past 6 years, she has generally done well. In the setting of worsening right hip pain, she has been taking ibuprofen twice daily times several months and has also required a prednisone taper. On 05/14/20 she was seen in urgent care for urinary pain and suprapubic pressure which she felt was related to a UTI. She also reported chest pressure, SOB, and wheezing at that visit. She had high BP so was prescribed lisinopril/HCTZ as well as Cipro. She was then admitted to the hospital on the medicine service 05/18/20 with continued chest pain with mixed features. She had also had nausea, vomiting, and dyspnea. ECG was unremarkable. Troponins normal. Chest x-ray was without acute findings. On telemetry, resting heart rate was in the 90s with occasional spikes into the low 100s. D-dimer was marginally elevated at 0.55 but wnl for age-adjusted. 2D echo showed EF 70-75%, hyperdynamic, mild LVH of basal septal segment, mild LVOT gradient likely related to vigorous LV function, grade 1 DD. Nuclear stress test was negative. LE venous duplex was negative. CTA not pursued as patient was feeling back to baseline prior to DC. Labs did show new mild renal insufficiency and hyponatremia during admission. HCTZ component and ibuprofen were discontinued. She was  started on Protonix and simvastatin.  She presents for follow-up today and is feeling much better. She reports she does not think she ever needed the lisinopril/HCTZ rx, since her BP only tended to run high in the past while having some sort of pain, otherwise normal all other times when she would check it at home. She has felt back to herself for the last 2 days and denies any CP. She has mild DOE which she states is chronic and back to baseline. No edema, bleeding, orthopnea. She has not taken her lisinopril yet today. Recheck BP by me 120/68.  Labwork independently reviewed: 05/20/20 K 3.8, Cr 1.07, Na 133, albumin 3.4, AST/ALT OK, TSH wnl, ddimer 0.55, Covid negative, LDL 135   Past Medical History:  Diagnosis Date  . AKI (acute kidney injury) ([place])    a. Cr ~1 range in 05/2020, previously 0.6 range.  . Arthritis    A. Hip & neck - takes ibuprofen 400mg  bid.  . Chest pain    a. 12/2013 ETT: Ex time - 4:00, upsloping ST depression-->low risk. b. Nuc 05/2020 low risk, normal LVEF.  . Common bile duct stone   . Diverticulitis   . Essential hypertension    high BP while having pain, normal BP at other times  . HLD (hyperlipidemia)     Past Surgical History:  Procedure Laterality Date  . CHOLECYSTECTOMY  october 2012  . ERCP  08/22/2012   Procedure: ENDOSCOPIC RETROGRADE CHOLANGIOPANCREATOGRAPHY (ERCP);  Surgeon: Inda Castle, MD;  Location: [facility];  Service: Endoscopy;  Laterality: N/A;  .  ERCP N/A 07/31/2013   Procedure: ENDOSCOPIC RETROGRADE CHOLANGIOPANCREATOGRAPHY (ERCP);  Surgeon: Irene Shipper, MD;  Location: [facility] ENDOSCOPY;  Service: Endoscopy;  Laterality: N/A;  . ESOPHAGOGASTRODUODENOSCOPY  04/25/13   Surveillance at [facility] - Normal appearing ampulla; No residual adenomatous tissue seen  . EUS  11/21/2012   Procedure: UPPER ENDOSCOPIC ULTRASOUND (EUS) LINEAR;  Surgeon: Milus Banister, MD;  Location: [facility] ENDOSCOPY;  Service: Endoscopy;  Laterality: N/A;  . HERNIA REPAIR    .  TUBAL LIGATION    . VENTRAL HERNIA REPAIR  08/2011   s/p chole, at trochar site, by Dr. Lucia Gaskins    Current Medications: Current Meds  Medication Sig  . albuterol (VENTOLIN HFA) 108 (90 Base) MCG/ACT inhaler Inhale 1-2 puffs into the lungs every 6 (six) hours as needed for wheezing or shortness of breath.  . pantoprazole (PROTONIX) 40 MG tablet Take 1 tablet (40 mg total) by mouth 2 (two) times daily.  . simvastatin (ZOCOR) 20 MG tablet Take 1 tablet (20 mg total) by mouth daily at 6 PM.  . [DISCONTINUED] lisinopril (ZESTRIL) 10 MG tablet Take 1 tablet (10 mg total) by mouth daily.      Allergies:   Codeine and Sulfur   Social History   Socioeconomic History  . Marital status: Widowed    Spouse name: Not on file  . Number of children: 2  . Years of education: Not on file  . Highest education level: Not on file  Occupational History  . Not on file  Tobacco Use  . Smoking status: Never Smoker  . Smokeless tobacco: Never Used  Vaping Use  . Vaping Use: Never used  Substance and Sexual Activity  . Alcohol use: No  . Drug use: No  . Sexual activity: Never  Other Topics Concern  . Not on file  Social History Narrative   Lives in [place] by herself.  Still works for [employer] as a Sports coach.  Active in and around her house.   Social Determinants of Health   Financial Resource Strain:   . Difficulty of Paying Living Expenses:   Food Insecurity:   . Worried About Charity fundraiser in the Last Year:   . Arboriculturist in the Last Year:   Transportation Needs:   . Film/video editor (Medical):   Marland Kitchen Lack of Transportation (Non-Medical):   Physical Activity:   . Days of Exercise per Week:   . Minutes of Exercise per Session:   Stress:   . Feeling of Stress :   Social Connections:   . Frequency of Communication with Friends and Family:   . Frequency of Social Gatherings with Friends and Family:   . Attends Religious Services:   . Active Member of Clubs or  Organizations:   . Attends Archivist Meetings:   Marland Kitchen Marital Status:      Family History:  The patient's family history includes Alcohol abuse in her father; Colon cancer (age of onset: 38) in her mother; Diabetes in her brother and sister; Hypertension in her mother and sister.  ROS:   Please see the history of present illness All other systems are reviewed and otherwise negative.     EKGs/Labs/Other Studies Reviewed:    Studies reviewed are outlined and summarized above. Reports included below if pertinent.  2D echo 05/19/20 1. Vigorous LV systolic function; mild proximal septal thickening; grade  1 diastolic dysfunction; mild LVOT gradient of 1.8 m/s likely related to  vigorous LV  function.  2. Left ventricular ejection fraction, by estimation, is 70 to 75%. The  left ventricle has hyperdynamic function. The left ventricle has no  regional wall motion abnormalities. There is mild left ventricular  hypertrophy of the basal-septal segment. Left  ventricular diastolic parameters are consistent with Grade I diastolic  dysfunction (impaired relaxation).  3. Right ventricular systolic function is normal. The right ventricular  size is normal. There is normal pulmonary artery systolic pressure.  4. The mitral valve is normal in structure. Trivial mitral valve  regurgitation. No evidence of mitral stenosis.  5. The aortic valve is tricuspid. Aortic valve regurgitation is not  visualized. Mild aortic valve sclerosis is present, with no evidence of  aortic valve stenosis.  6. The inferior vena cava is normal in size with greater than 50%  respiratory variability, suggesting right atrial pressure of 3 mmHg.   LE Venous Duplex 05/19/20 negative  NST 05/20/20  IMPRESSION: 1. No reversible ischemia or infarction. 2. Normal left ventricular wall motion. 3. Left ventricular ejection fraction 70% 4. Non invasive risk stratification*: Low    EKG:  EKG is not ordered  today  Recent Labs: 05/18/2020: Hemoglobin 12.9; Platelets 317; TSH 1.125 05/19/2020: ALT 17 05/20/2020: BUN 21; Creatinine, Ser 1.07; Potassium 3.8; Sodium 133  Recent Lipid Panel    Component Value Date/Time   CHOL 201 (H) 05/18/2020 1300   TRIG 99 05/18/2020 1300   HDL 46 05/18/2020 1300   CHOLHDL 4.4 05/18/2020 1300   VLDL 20 05/18/2020 1300   LDLCALC 135 (H) 05/18/2020 1300    PHYSICAL EXAM:    VS:  BP 112/60   Pulse 86   Ht 5\' 2"  (1.575 m)   Wt 158 lb 9.6 oz (71.9 kg)   SpO2 97%   BMI 29.01 kg/m   BMI: Body mass index is 29.01 kg/m.  GEN: Well nourished, well developed AAF, in no acute distress HEENT: normocephalic, atraumatic Neck: no JVD, carotid bruits, or masses Cardiac: RRR; no murmurs, rubs, or gallops, no edema  Respiratory:  clear to auscultation bilaterally, normal work of breathing GI: soft, nontender, nondistended, + BS MS: no deformity or atrophy Skin: warm and dry, no rash Neuro:  Alert and Oriented x 3, Strength and sensation are intact, follows commands Psych: euthymic mood, full affect  Wt Readings from Last 3 Encounters:  05/24/20 158 lb 9.6 oz (71.9 kg)  05/21/20 157 lb 3.2 oz (71.3 kg)  10/14/19 157 lb (71.2 kg)     ASSESSMENT & PLAN:   1. Chest pain of uncertain etiology and shortness of breath - not clear what this represented, but cardiac workup reassuring. She r/o for MI with negative stress test. At the time she also had fluctuating blood pressure in the context of UTI/pain. She was also taking regular ibuprofen and recent prednisone taper. Symptoms have improved with adjustment of medications above. She did have a mild LVOT gradient on her echo but I wonder if this was due in part to the diuretic she was getting with her antihypertensive. No murmur on exam today. She reports her BP has only ever been high during pain, but usually normal otherwise when she follows it at home. Since she is normotensive without taking lisinopril today, we will stop  completely for now. Instructions given to patient to monitor BP daily at varying times over the next week and call if noticing 2+ readings over 836 systolic.  2. Sinus tachycardia - resolved. May have been due to some dehydration. She is  not tachycardic, hypoxic, or tachypneic today. TSH wnl recently. 3. Acute kidney injury with hyponatremia - recheck BMET today. Suspect related to combo ACEI/HCTZ + ibuprofen use. 4. Recent labile blood pressure with hypertension then hypotension - see above regarding medication changes. She also has f/u with PCP next Friday and I asked her to bring that log to that appointment. RMA had printed AVS with lisinopril still on it so she called her to instruct her to cross it out. 5. Hyperlipidemia - started on statin in the hospital by internal medicine. Since she otherwise has no specific ongoing active cardiac issues requiring acute follow-up at this time, will have her f/u with primary care for further monitoring.  Disposition: F/u with Dr. Meda Coffee in 1 year.  Medication Adjustments/Labs and Tests Ordered: Current medicines are reviewed at length with the patient today.  Concerns regarding medicines are outlined above. Medication changes, Labs and Tests ordered today are summarized above and listed in the Patient Instructions accessible in Encounters.   Signed, Charlie Pitter, PA-C  05/24/2020 11:07 AM    [facility] [address], [place], [place]  [address] Phone: [phone]; Fax: [phone]

## 2020-05-22 NOTE — Telephone Encounter (Signed)
**Note De-Identified Tayja Manzer Obfuscation** 1st Attempted TCM Call: No answer so I left a message on the pts VM asking her to call Jeani Hawking at [facility] at [phone].

## 2020-05-23 NOTE — Telephone Encounter (Signed)
**Note De-Identified Kaladin Noseworthy Obfuscation** 2nd TCM Call Attempt: No answer so I left a message on the pts VM asking her to call Jeani Hawking at [facility] at [phone].

## 2020-05-24 ENCOUNTER — Encounter: Payer: Self-pay | Admitting: Physician Assistant

## 2020-05-24 ENCOUNTER — Telehealth: Payer: Self-pay | Admitting: *Deleted

## 2020-05-24 ENCOUNTER — Ambulatory Visit: Payer: BC Managed Care – PPO | Admitting: Physician Assistant

## 2020-05-24 ENCOUNTER — Other Ambulatory Visit: Payer: Self-pay

## 2020-05-24 VITALS — BP 112/60 | HR 86 | Ht 62.0 in | Wt 158.6 lb

## 2020-05-24 DIAGNOSIS — N179 Acute kidney failure, unspecified: Secondary | ICD-10-CM

## 2020-05-24 DIAGNOSIS — E871 Hypo-osmolality and hyponatremia: Secondary | ICD-10-CM

## 2020-05-24 DIAGNOSIS — R0602 Shortness of breath: Secondary | ICD-10-CM | POA: Diagnosis not present

## 2020-05-24 DIAGNOSIS — R000 Tachycardia, unspecified: Secondary | ICD-10-CM

## 2020-05-24 DIAGNOSIS — I959 Hypotension, unspecified: Secondary | ICD-10-CM

## 2020-05-24 DIAGNOSIS — R079 Chest pain, unspecified: Secondary | ICD-10-CM

## 2020-05-24 DIAGNOSIS — E785 Hyperlipidemia, unspecified: Secondary | ICD-10-CM

## 2020-05-24 LAB — BASIC METABOLIC PANEL
BUN/Creatinine Ratio: 20 (ref 12–28)
BUN: 15 mg/dL (ref 8–27)
CO2: 25 mmol/L (ref 20–29)
Calcium: 9.4 mg/dL (ref 8.7–10.3)
Chloride: 100 mmol/L (ref 96–106)
Creatinine, Ser: 0.74 mg/dL (ref 0.57–1.00)
GFR calc Af Amer: 90 mL/min/{1.73_m2} (ref >59)
GFR calc non Af Amer: 78 mL/min/{1.73_m2} (ref >59)
Glucose: 87 mg/dL (ref 65–99)
Potassium: 4.6 mmol/L (ref 3.5–5.2)
Sodium: 137 mmol/L (ref 134–144)

## 2020-05-24 NOTE — Patient Instructions (Addendum)
Medication Instructions:  Your physician has recommended you make the following change in your medication:  1.  STOP the Lisinopril   *If you need a refill on your cardiac medications before your next appointment, please call your pharmacy*   Lab Work: BMET  If you have labs (blood work) drawn today and your tests are completely normal, you will receive your results only by: Marland Kitchen MyChart Message (if you have MyChart) OR . A paper copy in the mail If you have any lab test that is abnormal or we need to change your treatment, we will call you to review the results.   Testing/Procedures: None ordered  Follow-Up: At [facility], you and your health needs are our priority.  As part of our continuing mission to provide you with exceptional heart care, we have created designated Provider Care Teams.  These Care Teams include your primary Cardiologist (physician) and Advanced Practice Providers (APPs -  Physician Assistants and Nurse Practitioners) who all work together to provide you with the care you need, when you need it.  We recommend signing up for the patient portal called "MyChart".  Sign up information is provided on this After Visit Summary.  MyChart is used to connect with patients for Virtual Visits (Telemedicine).  Patients are able to view lab/test results, encounter notes, upcoming appointments, etc.  Non-urgent messages can be sent to your provider as well.   To learn more about what you can do with MyChart, go to [email or web address].    Your next appointment:   12 month(s)  The format for your next appointment:   In Person  Provider:   You may see Candee Furbish, MD or one of the following Advanced Practice Providers on your designated Care Team:    Truitt Merle, NP  Cecilie Kicks, NP  Kathyrn Drown, NP    Other Instructions I would recommend using a blood pressure cuff that goes on your arm. The wrist ones can be inaccurate. If possible, try to select one that  also reports your heart rate. To check your blood pressure, choose a time at least 3 hours after taking your blood pressure medicines. If you can sample it at different times of the day, that's great - it might give you more information about how your blood pressure fluctuates. Remain seated in a chair for 5 minutes quietly beforehand, then check it. Please monitor your blood pressure occasionally at home. Call our office if you tend to get readings of greater than 130 on the top number.  You were started on cholesterol medicine in the hospital. Please discuss further monitoring/labs with your primary care provider later this month.

## 2020-05-24 NOTE — Telephone Encounter (Signed)
Call placed to pt to make sure she understands that she is to STOP the Lisinopril. Pt has been made aware and verbalized understanding.

## 2020-05-29 ENCOUNTER — Telehealth: Payer: Self-pay | Admitting: Physician Assistant

## 2020-05-29 NOTE — Telephone Encounter (Signed)
Transferred call to Jennifer.

## 2020-06-28 ENCOUNTER — Other Ambulatory Visit: Payer: Self-pay

## 2020-06-28 ENCOUNTER — Ambulatory Visit (HOSPITAL_COMMUNITY)
Admission: EM | Admit: 2020-06-28 | Discharge: 2020-06-28 | Disposition: A | Discharge status: Home / Self Care | Payer: BC Managed Care – PPO | Attending: Family Medicine | Admitting: Family Medicine

## 2020-06-28 ENCOUNTER — Encounter (HOSPITAL_COMMUNITY): Payer: Self-pay | Admitting: Family Medicine

## 2020-06-28 DIAGNOSIS — M779 Enthesopathy, unspecified: Secondary | ICD-10-CM

## 2020-06-28 MED ORDER — PREDNISONE 10 MG PO TABS
40.0000 mg | ORAL_TABLET | Freq: Every day | ORAL | 0 refills | Status: AC
Start: 2020-06-28 — End: 2020-07-03

## 2020-06-28 NOTE — ED Triage Notes (Signed)
Pt presents with right wrist pain x 2 months. Pain is worse when moving the wrist, pain radiates to right thumb.  Denies trauma, fall. States she sed a brace and did not help.

## 2020-06-28 NOTE — Discharge Instructions (Signed)
Take the prednisone as prescribed Keep wearing the splint.  Rest, ice Follow up with sports medicine as needed.

## 2020-06-28 NOTE — ED Provider Notes (Signed)
[facility]    CSN: [id number] Arrival date & time: 06/28/20  2376      History   Chief Complaint Chief Complaint  Patient presents with  . Wrist Pain    HPI Teresa Keller is a 79 y.o. female.   Patient is a 79 year old female who presents today with approximately 2 months of right wrist pain.  Pain is worse when moving the wrist and the pain radiates to right thumb.  Symptoms have been pretty constant, waxing waning.  Has been wearing a brace is not helping.  No prior injuries to the wrist.  She is already had imaging done of the wrist with negative results.  She has been using Voltaren gel and Tylenol without any relief.     Past Medical History:  Diagnosis Date  . AKI (acute kidney injury) ([place])    a. Cr ~1 range in 05/2020, previously 0.6 range.  . Arthritis    A. Hip & neck - takes ibuprofen 400mg  bid.  . Chest pain    a. 12/2013 ETT: Ex time - 4:00, upsloping ST depression-->low risk. b. Nuc 05/2020 low risk, normal LVEF.  . Common bile duct stone   . Diverticulitis   . Essential hypertension    high BP while having pain, normal BP at other times  . HLD (hyperlipidemia)     Patient Active Problem List   Diagnosis Date Noted  . Chest pain 05/21/2020  . Left-sided chest pain 05/18/2020  . Abdominal pain, epigastric 11/07/2014  . Preventive measure 01/01/2014  . Other screening mammogram 01/01/2014  . Chest pain at rest 12/25/2013  . Choledocholithiasis 08/08/2013  . Uncontrolled hypertension, stage 1 08/26/2012  . Prediabetes 08/26/2012  . Anemia 08/26/2012  . 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 CHOLECYSTECTOMY  october 2012  . ERCP  08/22/2012   Procedure: ENDOSCOPIC RETROGRADE CHOLANGIOPANCREATOGRAPHY (ERCP);  Surgeon: Inda Castle, MD;  Location: [facility];  Service: Endoscopy;  Laterality: N/A;  . ERCP N/A 07/31/2013   Procedure: ENDOSCOPIC RETROGRADE CHOLANGIOPANCREATOGRAPHY (ERCP);   Surgeon: Irene Shipper, MD;  Location: [facility] ENDOSCOPY;  Service: Endoscopy;  Laterality: N/A;  . ESOPHAGOGASTRODUODENOSCOPY  04/25/13   Surveillance at [facility] - Normal appearing ampulla; No residual adenomatous tissue seen  . EUS  11/21/2012   Procedure: UPPER ENDOSCOPIC ULTRASOUND (EUS) LINEAR;  Surgeon: Milus Banister, MD;  Location: [facility] ENDOSCOPY;  Service: Endoscopy;  Laterality: N/A;  . HERNIA REPAIR    . TUBAL LIGATION    . VENTRAL HERNIA REPAIR  08/2011   s/p chole, at trochar site, by Dr. Lucia Gaskins    OB History   No obstetric history on file.      Home Medications    Prior to Admission medications   Medication Sig Start Date End Date Taking? Authorizing Provider  albuterol (VENTOLIN HFA) 108 (90 Base) MCG/ACT inhaler Inhale 1-2 puffs into the lungs every 6 (six) hours as needed for wheezing or shortness of breath. 05/14/20   Enrique Sack, FNP  pantoprazole (PROTONIX) 40 MG tablet Take 1 tablet (40 mg total) by mouth 2 (two) times daily. 05/20/20   Regalado, Belkys A, MD  predniSONE (DELTASONE) 10 MG tablet Take 4 tablets (40 mg total) by mouth daily for 5 days. 06/28/20 07/03/20  Loura Halt A, NP  simvastatin (ZOCOR) 20 MG tablet Take 1 tablet (20 mg total) by mouth daily at 6 PM. 05/20/20   Regalado, Cassie Freer, MD  gabapentin (NEURONTIN) 100 MG capsule Take 2 capsules (200 mg total) by mouth 3 (three) times daily as needed. Patient not taking: Reported on 10/14/2019 09/08/19 05/03/20  Virgel Manifold, MD    Family History Family History  Problem Relation Age of Onset  . Hypertension Mother        died @ 23  . Colon cancer Mother 85  . Alcohol abuse Father        died in his 45's  . Hypertension Sister   . Diabetes Sister   . Diabetes Brother     Social History Social History   Tobacco Use  . Smoking status: Never Smoker  . Smokeless tobacco: Never Used  Vaping Use  . Vaping Use: Never used  Substance Use Topics  . Alcohol use: No  . Drug use: No      Allergies   Codeine and Sulfur   Review of Systems Review of Systems   Physical Exam Triage Vital Signs ED Triage Vitals  Enc Vitals Group     BP      Pulse      Resp      Temp      Temp src      SpO2      Weight      Height      Head Circumference      Peak Flow      Pain Score      Pain Loc      Pain Edu?      Excl. in [place]?    No data found.  Updated Vital Signs BP 139/65 (BP Location: Left Arm)   Pulse 78   Temp 98.1 F (36.7 C) (Oral)   Resp 18   SpO2 98%   Visual Acuity Right Eye Distance:   Left Eye Distance:   Bilateral Distance:    Right Eye Near:   Left Eye Near:    Bilateral Near:     Physical Exam Vitals and nursing note reviewed.  Constitutional:      General: She is not in acute distress.    Appearance: Normal appearance. She is not ill-appearing, toxic-appearing or diaphoretic.  HENT:     Head: Normocephalic.     Nose: Nose normal.  Eyes:     Conjunctiva/sclera: Conjunctivae normal.  Pulmonary:     Effort: Pulmonary effort is normal.  Musculoskeletal:     Right wrist: Swelling and tenderness present. Decreased range of motion.       Arms:     Cervical back: Normal range of motion.  Skin:    General: Skin is warm and dry.     Findings: No rash.  Neurological:     Mental Status: She is alert.  Psychiatric:        Mood and Affect: Mood normal.      UC Treatments / Results  Labs (all labs ordered are listed, but only abnormal results are displayed) Labs Reviewed - No data to display  EKG   Radiology No results found.  Procedures Procedures (including critical care time)  Medications Ordered in UC Medications - No data to display  Initial Impression / Assessment and Plan / UC Course  I have reviewed the triage vital signs and the nursing notes.  Pertinent labs & imaging results that were available during my care of the patient were reviewed by me and considered in my medical decision making (see chart for  details).     Tendinitis Treating with prednisone daily for  5 days. Keep wearing the splint, rest, ice, elevate. Follow up as needed for continued or worsening symptoms  Final Clinical Impressions(s) / UC Diagnoses   Final diagnoses:  Tendonitis     Discharge Instructions     Take the prednisone as prescribed Keep wearing the splint.  Rest, ice Follow up with sports medicine as needed.    ED Prescriptions    Medication Sig Dispense Auth. Provider   predniSONE (DELTASONE) 10 MG tablet Take 4 tablets (40 mg total) by mouth daily for 5 days. 20 tablet Loura Halt A, NP     PDMP not reviewed this encounter.   Orvan July, NP 06/28/20 [phone]

## 2020-07-09 ENCOUNTER — Encounter: Payer: Self-pay | Admitting: Sports Medicine

## 2020-07-09 ENCOUNTER — Ambulatory Visit (INDEPENDENT_AMBULATORY_CARE_PROVIDER_SITE_OTHER): Payer: BC Managed Care – PPO | Admitting: Sports Medicine

## 2020-07-09 ENCOUNTER — Other Ambulatory Visit: Payer: Self-pay

## 2020-07-09 VITALS — BP 130/60 | Ht 62.0 in | Wt 154.0 lb

## 2020-07-09 DIAGNOSIS — M654 Radial styloid tenosynovitis [de Quervain]: Secondary | ICD-10-CM

## 2020-07-09 NOTE — Progress Notes (Signed)
   Subjective:    Patient ID: Teresa Keller, female    DOB: 1941-01-03, 79 y.o.   MRN: [id number]  HPI chief complaint: Right wrist pain  Very pleasant 79 year old female comes in today complaining of several weeks of radial sided right wrist pain. No injury that she can recall but a gradual onset of pain that is worse with activity. She was recently seen at a local urgent care and prescribed prednisone which did not help. She has been wearing a cock up wrist brace which is minimally helpful. She was using Voltaren gel and did notice an improvement with this. She also noticed an improvement with icing. She works as a Sports coach. Recent x-rays done at her primary care physician's office were negative per her report.  Past medical history reviewed Medications reviewed Allergies reviewed    Review of Systems    Above Objective:   Physical Exam  Well-developed, well-nourished. No acute distress  Right wrist: Patient has slight swelling of the first dorsal compartment. Pain with Wynn Maudlin testing. Negative CMC grind. No tenderness to palpation across the dorsum of the wrist. No tenderness over the ECU tendon. Good pulses.      Assessment & Plan:   De Quervain's tenosynovitis, right wrist  Patient is wearing the wrong type of wrist brace. We will put her in a thumb spica brace to wear at work for the next 3 weeks. I discussed cortisone injection today but the patient elected to resume daily Voltaren and icing instead. She will follow up with me again in 3 weeks for reevaluation. If symptoms persist despite today's treatment, then I would reconsider cortisone injection.

## 2020-07-30 ENCOUNTER — Other Ambulatory Visit: Payer: Self-pay

## 2020-07-30 ENCOUNTER — Ambulatory Visit (INDEPENDENT_AMBULATORY_CARE_PROVIDER_SITE_OTHER): Payer: BC Managed Care – PPO | Admitting: Sports Medicine

## 2020-07-30 VITALS — BP 146/75 | Ht 62.0 in | Wt 159.0 lb

## 2020-07-30 DIAGNOSIS — M654 Radial styloid tenosynovitis [de Quervain]: Secondary | ICD-10-CM | POA: Diagnosis not present

## 2020-07-30 NOTE — Progress Notes (Signed)
   Subjective:    Patient ID: Teresa Keller, female    DOB: May 31, 1941, 79 y.o.   MRN: [id number]  HPI   Teresa Keller comes in today for follow-up on right wrist de Quervain's tenosynovitis.  Overall, she states that she is about 80% improved.  Still some general soreness over the radial styloid but pain has improved.  She has been wearing her thumb spica brace at work.  She is also using Voltaren gel.    Review of Systems    As above Objective:   Physical Exam  Note, well-nourished.  Right wrist: Full range of motion.  No effusion.  There is no swelling in the first dorsal compartment.  Negative Finkelstein's.  There is still some slight tenderness to palpation over the radial styloid.  Good grip.  Good pulses.      Assessment & Plan:   Improving right wrist de Quervain's tenosynovitis  Since the patient is 80% better, we will continue with current treatment for another 3 weeks.  She will continue with her thumb spica brace at work (she works as a Sports coach at [employer]).  She will also continue with her Voltaren gel.  If symptoms resolve over the next 3 weeks, she may feel free to cancel her follow-up appointment.  However, if her symptoms plateau then we will reconsider cortisone injection.

## 2020-08-22 ENCOUNTER — Ambulatory Visit: Payer: BC Managed Care – PPO | Admitting: Sports Medicine

## 2020-09-09 ENCOUNTER — Other Ambulatory Visit: Payer: Self-pay | Admitting: Internal Medicine

## 2021-08-06 ENCOUNTER — Other Ambulatory Visit: Payer: Self-pay

## 2021-08-06 ENCOUNTER — Encounter (HOSPITAL_COMMUNITY): Payer: Self-pay

## 2021-08-06 ENCOUNTER — Ambulatory Visit (HOSPITAL_COMMUNITY)
Admission: EM | Admit: 2021-08-06 | Discharge: 2021-08-06 | Disposition: A | Discharge status: Home / Self Care | Payer: BC Managed Care – PPO | Attending: Emergency Medicine | Admitting: Emergency Medicine

## 2021-08-06 DIAGNOSIS — M1990 Unspecified osteoarthritis, unspecified site: Secondary | ICD-10-CM

## 2021-08-06 MED ORDER — PREDNISONE 10 MG (21) PO TBPK
ORAL_TABLET | Freq: Every day | ORAL | 0 refills | Status: DC
Start: 2021-08-06 — End: 2021-11-29

## 2021-08-06 NOTE — ED Triage Notes (Signed)
Pt presents with chronic bilateral leg pain and body pain that she believes to be arthritis.

## 2021-08-06 NOTE — Discharge Instructions (Signed)
Take the prednisone as prescribed.   You can take Tylenol as needed for pain.  Do not take the Aleve while you are taking the prednisone.   You can use heat, ice, or alternate between heat and ice for comfort.   You can use IcyHot, lidocaine patches, aspercreme, bengay, biofreeze, or voltaren gel as needed for pain relief.   Follow up with your primary care provider for re-evaluation as scheduled.

## 2021-08-06 NOTE — ED Provider Notes (Signed)
[facility]    CSN: [id number] Arrival date & time: 08/06/21  1305      History   Chief Complaint Chief Complaint  Patient presents with   Leg Pain   Generalized Body Aches    HPI Teresa Keller is a 80 y.o. female.   Patient here for evaluation of chronic bilateral foot, right hip, and bilateral hand pain that has been ongoing for the past several months but worsening over the past 2 weeks.  Patient does report a history of arthritis and believes this pain is related to that.  Reports that her primary care provider was not able to get her an appointment before next month.  Reports taking Tylenol and Aleve with minimal symptom relief.  Reports pain is worse in the morning or after sitting for extended periods of time.  Denies any trauma, injury, or other precipitating event.  Denies any fevers, chest pain, shortness of breath, N/V/D, numbness, tingling, weakness, abdominal pain, or headaches.    The history is provided by the patient.  Leg Pain  Past Medical History:  Diagnosis Date   AKI (acute kidney injury) ([place])    a. Cr ~1 range in 05/2020, previously 0.6 range.   Arthritis    A. Hip & neck - takes ibuprofen 400mg  bid.   Chest pain    a. 12/2013 ETT: Ex time - 4:00, upsloping ST depression-->low risk. b. Nuc 05/2020 low risk, normal LVEF.   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    Past Surgical History:  Procedure Laterality  Date   CHOLECYSTECTOMY  october 2012   ERCP  08/22/2012   Procedure: ENDOSCOPIC RETROGRADE CHOLANGIOPANCREATOGRAPHY (ERCP);  Surgeon: Inda Castle, MD;  Location: [facility];  Service: Endoscopy;  Laterality: N/A;   ERCP N/A 07/31/2013   Procedure: ENDOSCOPIC RETROGRADE CHOLANGIOPANCREATOGRAPHY (ERCP);  Surgeon: Irene Shipper, MD;  Location[facility] ENDOSCOPY;  Service: Endoscopy;  Laterality: N/A;   ESOPHAGOGASTRODUODENOSCOPY  04/25/13   Surveillance at [facility] - Normal appearing ampulla; No residual adenomatous tissue seen   EUS  11/21/2012   Procedure: UPPER ENDOSCOPIC ULTRASOUND (EUS) LINEAR;  Surgeon: Milus Banister, MD;  Location: [facility] ENDOSCOPY;  Service: Endoscopy;  Laterality: N/A;   HERNIA REPAIR     TUBAL LIGATION     VENTRAL HERNIA REPAIR  08/2011   s/p chole, at trochar site, by Dr. Lucia Gaskins    OB History   No obstetric history on file.      Home Medications    Prior to Admission medications   Medication Sig Start Date End Date Taking? Authorizing Provider  predniSONE (STERAPRED UNI-PAK 21 TAB) 10 MG (21) TBPK tablet Take by mouth daily. Take 6 tabs by mouth daily  for 2 days, then 5 tabs for 2 days, then 4 tabs for 2 days, then 3 tabs for 2 days, 2 tabs for 2 days, then 1 tab by mouth daily for 2 days  08/06/21  Yes Pearson Forster, NP  albuterol (VENTOLIN HFA) 108 (90 Base) MCG/ACT inhaler Inhale 1-2 puffs into the lungs every 6 (six) hours as needed for wheezing or shortness of breath. 05/14/20   Enrique Sack, FNP  pantoprazole (PROTONIX) 40 MG tablet Take 1 tablet (40 mg total) by mouth 2 (two) times daily. 05/20/20   Regalado, Belkys A, MD  simvastatin (ZOCOR) 20 MG tablet Take 1 tablet (20 mg total) by mouth daily at 6 PM. 05/20/20   Regalado, Belkys A, MD  gabapentin (NEURONTIN) 100 MG capsule Take 2 capsules (200 mg total) by mouth 3 (three) times daily as needed. Patient not taking: Reported on 10/14/2019 09/08/19 05/03/20  Virgel Manifold, MD    Family History Family History   Problem Relation Age of Onset   Hypertension Mother        died @ 77   Colon cancer Mother 61   Alcohol abuse Father        died in his 67's   Hypertension Sister    Diabetes Sister    Diabetes Brother     Social History Social History   Tobacco Use   Smoking status: Never   Smokeless tobacco: Never  Vaping Use   Vaping Use: Never used  Substance Use Topics   Alcohol use: No   Drug use: No     Allergies   Codeine and Elemental sulfur   Review of Systems Review of Systems  Musculoskeletal:  Positive for arthralgias and joint swelling.  All other systems reviewed and are negative.   Physical Exam Triage Vital Signs ED Triage Vitals  Enc Vitals Group     BP 08/06/21 1428 (!) 149/75     Pulse Rate 08/06/21 1428 85     Resp 08/06/21 1428 17     Temp 08/06/21 1428 98.1 F (36.7 C)     Temp Source 08/06/21 1428 Oral     SpO2 08/06/21 1428 96 %     Weight --      Height --      Head Circumference --      Peak Flow --      Pain Score 08/06/21 1432 5     Pain Loc --      Pain Edu? --      Excl. in [place]? --    No data found.  Updated Vital Signs BP (!) 149/75 (BP Location: Right Arm)   Pulse 85   Temp 98.1 F (36.7 C) (Oral)   Resp 17   SpO2 96%   Visual Acuity Right Eye Distance:   Left Eye Distance:   Bilateral Distance:    Right Eye Near:   Left Eye Near:    Bilateral Near:     Physical Exam Vitals and nursing note reviewed.  Constitutional:      General: She is not in acute distress.    Appearance: Normal appearance. She is not ill-appearing, toxic-appearing or diaphoretic.  HENT:     Head: Normocephalic and atraumatic.  Eyes:     Conjunctiva/sclera: Conjunctivae normal.  Cardiovascular:     Rate and Rhythm: Normal rate.     Pulses: Normal pulses.  Pulmonary:     Effort: Pulmonary effort is normal.  Abdominal:     General: Abdomen is flat.  Musculoskeletal:     Right hand: Tenderness present. Decreased range of motion (due to pain).  Normal capillary refill. Normal pulse.     Left hand: Tenderness present. Decreased range of motion (due  to pain). Normal capillary refill. Normal pulse.     Cervical back: Normal range of motion.     Right hip: No bony tenderness or crepitus. Decreased range of motion (due to pain).     Right lower leg: No swelling. No edema.     Left lower leg: No swelling. No edema.  Skin:    General: Skin is warm and dry.  Neurological:     General: No focal deficit present.     Mental Status: She is alert and oriented to person, place, and time.  Psychiatric:        Mood and Affect: Mood normal.     UC Treatments / Results  Labs (all labs ordered are listed, but only abnormal results are displayed) Labs Reviewed - No data to display  EKG   Radiology No results found.  Procedures Procedures (including critical care time)  Medications Ordered in UC Medications - No data to display  Initial Impression / Assessment and Plan / UC Course  I have reviewed the triage vital signs and the nursing notes.  Pertinent labs & imaging results that were available during my care of the patient were reviewed by me and considered in my medical decision making (see chart for details).    Assessment negative for red flags or concerns.  Pain likely related to arthritis.  Will treat with prednisone Dosepak.  May take Tylenol as needed.  Discussed conservative symptom management including heat, ice, and Voltaren gel.  Follow-up with primary care for reevaluation as scheduled.  Final Clinical Impressions(s) / UC Diagnoses   Final diagnoses:  Arthritis     Discharge Instructions      Take the prednisone as prescribed.   You can take Tylenol as needed for pain.  Do not take the Aleve while you are taking the prednisone.   You can use heat, ice, or alternate between heat and ice for comfort.   You can use IcyHot, lidocaine patches, aspercreme, bengay, biofreeze, or voltaren gel as needed for pain  relief.   Follow up with your primary care provider for re-evaluation as scheduled.      ED Prescriptions     Medication Sig Dispense Auth. Provider   predniSONE (STERAPRED UNI-PAK 21 TAB) 10 MG (21) TBPK tablet Take by mouth daily. Take 6 tabs by mouth daily  for 2 days, then 5 tabs for 2 days, then 4 tabs for 2 days, then 3 tabs for 2 days, 2 tabs for 2 days, then 1 tab by mouth daily for 2 days 42 tablet Pearson Forster, NP      PDMP not reviewed this encounter.   Pearson Forster, NP 08/06/21 1454

## 2021-08-20 ENCOUNTER — Encounter (HOSPITAL_COMMUNITY): Payer: Self-pay

## 2021-08-20 ENCOUNTER — Emergency Department (HOSPITAL_COMMUNITY): Payer: BC Managed Care – PPO

## 2021-08-20 ENCOUNTER — Emergency Department (HOSPITAL_COMMUNITY)
Admission: EM | Admit: 2021-08-20 | Discharge: 2021-08-20 | Disposition: A | Discharge status: Home / Self Care | Payer: BC Managed Care – PPO | Attending: Emergency Medicine | Admitting: Emergency Medicine

## 2021-08-20 ENCOUNTER — Other Ambulatory Visit: Payer: Self-pay

## 2021-08-20 DIAGNOSIS — R079 Chest pain, unspecified: Secondary | ICD-10-CM | POA: Diagnosis present

## 2021-08-20 DIAGNOSIS — I10 Essential (primary) hypertension: Secondary | ICD-10-CM | POA: Diagnosis not present

## 2021-08-20 DIAGNOSIS — R0789 Other chest pain: Secondary | ICD-10-CM | POA: Insufficient documentation

## 2021-08-20 LAB — CBC
HCT: 38.5 % (ref 36.0–46.0)
Hemoglobin: 12.8 g/dL (ref 12.0–15.0)
MCH: 28.6 pg (ref 26.0–34.0)
MCHC: 33.2 g/dL (ref 30.0–36.0)
MCV: 85.9 fL (ref 80.0–100.0)
Platelets: 286 10*3/uL (ref 150–400)
RBC: 4.48 MIL/uL (ref 3.87–5.11)
RDW: 15.8 % — ABNORMAL HIGH (ref 11.5–15.5)
WBC: 13.2 10*3/uL — ABNORMAL HIGH (ref 4.0–10.5)
nRBC: 0 % (ref 0.0–0.2)

## 2021-08-20 LAB — BASIC METABOLIC PANEL
Anion gap: 8 (ref 5–15)
BUN: 9 mg/dL (ref 8–23)
CO2: 27 mmol/L (ref 22–32)
Calcium: 9.2 mg/dL (ref 8.9–10.3)
Chloride: 100 mmol/L (ref 98–111)
Creatinine, Ser: 0.61 mg/dL (ref 0.44–1.00)
GFR, Estimated: >60 mL/min (ref >60)
Glucose, Bld: 102 mg/dL — ABNORMAL HIGH (ref 70–99)
Potassium: 4.5 mmol/L (ref 3.5–5.1)
Sodium: 135 mmol/L (ref 135–145)

## 2021-08-20 LAB — TROPONIN I (HIGH SENSITIVITY)
Troponin I (High Sensitivity): 5 ng/L (ref <18)
Troponin I (High Sensitivity): 5 ng/L (ref <18)

## 2021-08-20 NOTE — ED Provider Notes (Signed)
[facility] EMERGENCY DEPARTMENT Provider Note   CSN: [id number] Arrival date & time: 08/20/21  8127     History Chief Complaint  Patient presents with   Chest Pain    Teresa Keller is a 80 y.o. female present emergency department with left-sided chest pain.  Patient reports symptoms have been intermittent for the past 2 to 3 days, worse at night.  She describes a dull ache and sometimes sharp pain in the left side under her breast.  It was worse at 3 AM woke her up from sleep last night.  She thought it was gas pain, she has had similar symptoms in the past, the pain did respond to Mylanta.  She wanted to come to the ER to get checked out just in case.  She denies lightheadedness, cough, congestion, dizziness, diaphoresis.  She says she is currently asymptomatic.  She denies that the pain radiates or travels anywhere.  She reports she has had similar symptoms in the past.  She has had a cardiac work-up as recently as 1 year ago, with an echocardiogram showing hyperdynamic left ventricular function, grade 1 diastolic failure, but no regional wall motion abnormalities.  She also had a stress test within the past 2 years which was unremarkable.  She denies history of baseline hypertension, hyperlipidemia, smoking, diabetes, or significant family history of MI.  She denies any exertional chest pain today or recently.  She is here with her granddaughter.  HPI     Past Medical History:  Diagnosis Date   AKI (acute kidney injury) ([place])    a. Cr ~1 range in 05/2020, previously 0.6 range.   Arthritis    A. Hip & neck - takes ibuprofen 400mg  bid.   Chest pain    a. 12/2013 ETT: Ex time - 4:00, upsloping ST depression-->low risk. b. Nuc 05/2020 low risk, normal LVEF.   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    Past Surgical History:  Procedure Laterality Date   CHOLECYSTECTOMY  october 2012   ERCP  08/22/2012   Procedure: ENDOSCOPIC RETROGRADE CHOLANGIOPANCREATOGRAPHY (ERCP);  Surgeon: Inda Castle, MD;  Location: [place];  Service: Endoscopy;  Laterality: N/A;   ERCP N/A 07/31/2013   Procedure: ENDOSCOPIC RETROGRADE CHOLANGIOPANCREATOGRAPHY (ERCP);  Surgeon: Irene Shipper, MD;  Location: [facility] ENDOSCOPY;  Service: Endoscopy;  Laterality: N/A;   ESOPHAGOGASTRODUODENOSCOPY  04/25/13   Surveillance at [facility] - Normal appearing ampulla; No residual adenomatous tissue seen   EUS  11/21/2012   Procedure: UPPER ENDOSCOPIC ULTRASOUND (EUS) LINEAR;  Surgeon: Milus Banister, MD;  Location: [facility] ENDOSCOPY;  Service: Endoscopy;  Laterality: N/A;   HERNIA REPAIR     TUBAL LIGATION     VENTRAL HERNIA REPAIR  08/2011   s/p chole, at trochar site, by Dr. Lucia Gaskins     OB History   No obstetric history on file.     Family History  Problem  Relation Age of Onset   Hypertension Mother        died @ 64   Colon cancer Mother 24   Alcohol abuse Father        died in his 31's   Hypertension Sister    Diabetes Sister    Diabetes Brother     Social History   Tobacco Use   Smoking status: Never   Smokeless tobacco: Never  Vaping Use   Vaping Use: Never used  Substance Use Topics   Alcohol use: No   Drug use: No    Home Medications Prior to Admission medications   Medication Sig Start Date End Date Taking? Authorizing Provider  albuterol (VENTOLIN HFA) 108 (90 Base) MCG/ACT inhaler Inhale 1-2 puffs into the lungs every 6 (six)  hours as needed for wheezing or shortness of breath. 05/14/20   Enrique Sack, FNP  pantoprazole (PROTONIX) 40 MG tablet Take 1 tablet (40 mg total) by mouth 2 (two) times daily. 05/20/20   Regalado, Belkys A, MD  predniSONE (STERAPRED UNI-PAK 21 TAB) 10 MG (21) TBPK tablet Take by mouth daily. Take 6 tabs by mouth daily  for 2 days, then 5 tabs for 2 days, then 4 tabs for 2 days, then 3 tabs for 2 days, 2 tabs for 2 days, then 1 tab by mouth daily for 2 days 08/06/21   Pearson Forster, NP  simvastatin (ZOCOR) 20 MG tablet Take 1 tablet (20 mg total) by mouth daily at 6 PM. 05/20/20   Regalado, Belkys A, MD  gabapentin (NEURONTIN) 100 MG capsule Take 2 capsules (200 mg total) by mouth 3 (three) times daily as needed. Patient not taking: Reported on 10/14/2019 09/08/19 05/03/20  Virgel Manifold, MD    Allergies    Codeine and Elemental sulfur  Review of Systems   Review of Systems  Constitutional:  Negative for chills and fever.  Eyes:  Negative for pain and visual disturbance.  Respiratory:  Negative for cough and shortness of breath.   Cardiovascular:  Positive for chest pain. Negative for palpitations.  Gastrointestinal:  Negative for abdominal pain, constipation, diarrhea, nausea and vomiting.  Genitourinary:  Negative for dysuria and hematuria.  Musculoskeletal:  Negative for arthralgias and back pain.  Skin:  Negative for color change and rash.  Neurological:  Negative for syncope and headaches.  All other systems reviewed and are negative.  Physical Exam Updated Vital Signs BP 119/66   Pulse (!) 58   Temp 97.9 F (36.6 C) (Oral)   Resp 20   SpO2 100%   Physical Exam Constitutional:      General: She is not in acute distress. HENT:     Head: Normocephalic and atraumatic.  Eyes:     Conjunctiva/sclera: Conjunctivae normal.     Pupils: Pupils are equal, round, and reactive to light.  Cardiovascular:     Rate and Rhythm: Normal rate and regular rhythm.  Pulmonary:      Effort: Pulmonary effort is normal. No respiratory distress.  Abdominal:     General: There is no distension.     Tenderness: There is no abdominal tenderness.  Skin:    General: Skin is warm and dry.  Neurological:     General: No focal deficit present.     Mental Status: She is alert. Mental status is at baseline.  Psychiatric:        Mood and Affect: Mood normal.        Behavior: Behavior normal.    ED  Results / Procedures / Treatments   Labs (all labs ordered are listed, but only abnormal results are displayed) Labs Reviewed  BASIC METABOLIC PANEL - Abnormal; Notable for the following components:      Result Value   Glucose, Bld 102 (*)    All other components within normal limits  CBC - Abnormal; Notable for the following components:   WBC 13.2 (*)    RDW 15.8 (*)    All other components within normal limits  TROPONIN I (HIGH SENSITIVITY)  TROPONIN I (HIGH SENSITIVITY)    EKG EKG Interpretation  Date/Time:  Wednesday August 20 2021 06:51:44 EDT Ventricular Rate:  75 PR Interval:  132 QRS Duration: 76 QT Interval:  370 QTC Calculation: 413 R Axis:   32 Text Interpretation: Normal sinus rhythm Normal ECG Confirmed by Octaviano Glow [phone]) on 08/20/2021 3:08:13 PM  Radiology DG Chest 2 View  Result Date: 08/20/2021 CLINICAL DATA:  80 year old female with history of chest pain for the past 2 days. EXAM: CHEST - 2 VIEW COMPARISON:  Chest x-ray 05/18/2020. FINDINGS: Lung volumes are normal. No consolidative airspace disease. No pleural effusions. No pneumothorax. No pulmonary nodule or mass noted. Pulmonary vasculature and the cardiomediastinal silhouette are within normal limits. IMPRESSION: No radiographic evidence of acute cardiopulmonary disease. Electronically Signed   By: Vinnie Langton M.D.   On: 08/20/2021 07:45    Procedures Procedures   Medications Ordered in ED Medications - No data to display  ED Course  I have reviewed the triage vital signs and  the nursing notes.  Pertinent labs & imaging results that were available during my care of the patient were reviewed by me and considered in my medical decision making (see chart for details).  This patient presents to the Emergency Department with complaint of chest pain. This involves an extensive number of treatment options, and is a complaint that carries with it a high risk of complications and morbidity.  The differential diagnosis includes ACS vs Pneumothorax vs Reflux/Gastritis vs MSK pain vs Pneumonia vs other.  This is most likely reflux, gastritis, or gas pain, given that the symptoms seem to be worse at night, that they are intermittent, they did respond favorably to Mylanta.  I felt PE was less likely given that that her symptoms are her mom, she has no hypoxia, and is asymptomatic at this time.  No acute risk factors for PE otherwise.  I ordered, reviewed, and interpreted labs, including BMP and CBC.  There were no immediate, life-threatening emergencies found in this labwork.  The patient's troponin level was low, flat. I ordered imaging studies which included dg chest I independently visualized and interpreted imaging which showed no acute abnormalities, and the monitor tracing which showed a normal sinus rhythm with no acute ischemic findings per my interpretation. Previous records obtained and reviewed showing stress test, echocardiogram, cardiac work-up in the past 2 years  After the interventions stated above, I reevaluated the patient and found that they remained clinically stable and pain free.  Based on the patient's clinical exam, vital signs, risk factors, and ED testing, I felt that the patient's overall risk of life-threatening emergency such as ACS, PE, sepsis, or infection was low.  At this time, I felt the patient's presentation was most clinically consistent with gastritis/reflux, but explained to the patient that this evaluation was not a definitive diagnostic  workup.  I discussed outpatient follow up with primary care provider, and provided specialist office number on the patient's discharge paper if  a referral was deemed necessary.  Return precautions were discussed with the patient.  I felt the patient was clinically stable for discharge.  HEART score of 3.     Final Clinical Impression(s) / ED Diagnoses Final diagnoses:  Atypical chest pain    Rx / DC Orders ED Discharge Orders     None        Wyvonnia Dusky, MD 08/20/21 1806

## 2021-08-20 NOTE — Discharge Instructions (Addendum)
You were diagnosed with chest pain today.  This is a non-specific diagnosis, but your provider did not feel this was a life-threatening condition at this time.  Chest pain is a common presenting condition in the Emergency Department, and it is not uncommon for patients to leave without a specific diagnosis.  It is important to remember that your workup today was not a complete medical workup.  You may still have a serious medical attention that needs follow up care with a specialist. Please follow up with your CARDIOLOGIST - call them tomorrow to request a follow up appointment as soon as possible for your chest pain.  It is also important that you speak to your primary care provider in 1-2 days after this visit to the ER.  Your PCP may want to see you in the office, or else they may want you to follow up with the specialist.  [place] IF:  You have severe chest pain, especially if the pain is crushing or pressure-like and spreads to the arms, back, neck, or jaw, or if you have sweating, nausea (feeling sick to your stomach), or shortness of breath. THIS IS AN EMERGENCY. Don't wait to see if the pain will go away. Get medical help at once. Call 911 or 0 (operator). DO NOT drive yourself to the hospital.   Your chest pain gets worse and does not go away with rest.  You have an attack of chest pain lasting longer than usual, despite rest and treatment with the medications your caregiver has prescribed.  You wake from sleep with chest pain or shortness of breath.  You feel dizzy or faint.  You have chest pain not typical of your usual pain for which you originally saw your caregiver.

## 2021-08-20 NOTE — ED Triage Notes (Signed)
Pt states that she has been having CP for the past 2-3 days, denies SOB, n/v

## 2021-11-29 ENCOUNTER — Ambulatory Visit (HOSPITAL_COMMUNITY)
Admission: EM | Admit: 2021-11-29 | Discharge: 2021-11-29 | Disposition: A | Discharge status: Home / Self Care | Payer: BC Managed Care – PPO | Attending: Physician Assistant | Admitting: Physician Assistant

## 2021-11-29 ENCOUNTER — Encounter (HOSPITAL_COMMUNITY): Payer: Self-pay

## 2021-11-29 ENCOUNTER — Ambulatory Visit (HOSPITAL_COMMUNITY): Payer: BC Managed Care – PPO

## 2021-11-29 ENCOUNTER — Other Ambulatory Visit: Payer: Self-pay

## 2021-11-29 DIAGNOSIS — Z20822 Contact with and (suspected) exposure to covid-19: Secondary | ICD-10-CM | POA: Insufficient documentation

## 2021-11-29 DIAGNOSIS — J069 Acute upper respiratory infection, unspecified: Secondary | ICD-10-CM | POA: Diagnosis not present

## 2021-11-29 DIAGNOSIS — R0789 Other chest pain: Secondary | ICD-10-CM

## 2021-11-29 DIAGNOSIS — U071 COVID-19: Secondary | ICD-10-CM | POA: Insufficient documentation

## 2021-11-29 LAB — SARS CORONAVIRUS 2 (TAT 6-24 HRS): SARS Coronavirus 2: POSITIVE — AB

## 2021-11-29 LAB — POC INFLUENZA A AND B ANTIGEN (URGENT CARE ONLY)
INFLUENZA A ANTIGEN, POC: NEGATIVE
INFLUENZA B ANTIGEN, POC: NEGATIVE

## 2021-11-29 NOTE — Discharge Instructions (Signed)
Your flu testing was negative.  I do recommend that you consider the EKG and chest x-ray that we talked about during your visit.  If you have any changing symptoms including increased pain or shortness of breath please go to the emergency room for further evaluation.  We will contact you if your COVID test is positive.  Make sure you are drinking plenty of fluid.  Use over-the-counter medications for symptom relief including Tylenol, Mucinex, Flonase.  If you have any changing or worsening symptoms go to the ER.

## 2021-11-29 NOTE — ED Provider Notes (Signed)
[place]    CSN: [id number] Arrival date & time: 11/29/21  1018      History   Chief Complaint Chief Complaint  Patient presents with   Influenza    HPI Teresa Keller is a 81 y.o. female.   Patient presents today with a 4-day history of URI symptoms.  She reports headache, body aches, sore throat, cough, nasal congestion, sneezing, diarrhea.  She does report some chest discomfort today but says this is mild and not concerning.  On further questioning patient reports that she has had significant cardiac work-up in the past but this is always been normal; had a stress test that showed hyperdynamic left ventricle with hypertrophy and echocardiogram that showed EF of 70-75% in 2021.  She denies any fever, significant chest pain, palpitations, lightheadedness, near syncope.  Reports she has been exposed to someone with COVID at her place of employment.  She has had her COVID vaccines and has not had COVID in the past.  She has had her flu shot.  She denies any significant past medical history including asthma, allergies, smoking, diabetes, cardiovascular disease.  Denies any recent antibiotic use.  She has not tried any medication for symptom management.   Past Medical History:  Diagnosis Date   AKI (acute kidney injury) ([place])    a. Cr ~1 range in 05/2020, previously 0.6 range.   Arthritis    A. Hip & neck - takes ibuprofen 400mg  bid.   Chest pain    a. 12/2013 ETT: Ex time - 4:00, upsloping ST depression-->low risk. b. Nuc 05/2020 low risk, normal LVEF.   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    Past Surgical History:  Procedure Laterality Date   CHOLECYSTECTOMY  october 2012   ERCP  08/22/2012   Procedure: ENDOSCOPIC RETROGRADE CHOLANGIOPANCREATOGRAPHY (ERCP);  Surgeon: Inda Castle, MD;  Location: [facility];  Service: Endoscopy;  Laterality: N/A;   ERCP N/A 07/31/2013   Procedure: ENDOSCOPIC RETROGRADE CHOLANGIOPANCREATOGRAPHY (ERCP);  Surgeon: Irene Shipper, MD;  Location: [facility] ENDOSCOPY;  Service: Endoscopy;  Laterality: N/A;   ESOPHAGOGASTRODUODENOSCOPY  04/25/13   Surveillance at [facility] - Normal appearing ampulla; No residual adenomatous tissue seen   EUS  11/21/2012   Procedure: UPPER ENDOSCOPIC ULTRASOUND (EUS) LINEAR;  Surgeon: Milus Banister, MD;  Location: [facility] ENDOSCOPY;  Service: Endoscopy;  Laterality: N/A;   HERNIA REPAIR     TUBAL LIGATION     VENTRAL HERNIA REPAIR  08/2011   s/p chole, at trochar site, by Dr. Lucia Gaskins    OB History   No obstetric history on file.      Home Medications    Prior to Admission medications   Medication Sig Start Date End Date Taking? Authorizing Provider  pantoprazole (PROTONIX) 40 MG tablet Take 1 tablet (40 mg total) by mouth 2 (two) times daily. 05/20/20  Yes Regalado, Belkys A, MD  albuterol (VENTOLIN HFA) 108 (  90 Base) MCG/ACT inhaler Inhale 1-2 puffs into the lungs every 6 (six) hours as needed for wheezing or shortness of breath. 05/14/20   Enrique Sack, FNP  gabapentin (NEURONTIN) 100 MG capsule Take 2 capsules (200 mg total) by mouth 3 (three) times daily as needed. Patient not taking: Reported on 10/14/2019 09/08/19 05/03/20  Virgel Manifold, MD    Family History Family History  Problem Relation Age of Onset   Hypertension Mother        died @ 60   Colon cancer Mother 56   Alcohol abuse Father        died in his 57's   Hypertension Sister     Diabetes Sister    Diabetes Brother     Social History Social History   Tobacco Use   Smoking status: Never   Smokeless tobacco: Never  Vaping Use   Vaping Use: Never used  Substance Use Topics   Alcohol use: No   Drug use: No     Allergies   Codeine and Elemental sulfur   Review of Systems Review of Systems  Constitutional:  Positive for activity change and fatigue. Negative for appetite change and fever.  HENT:  Positive for congestion and sore throat. Negative for sinus pressure and sneezing.   Respiratory:  Positive for cough and chest tightness. Negative for shortness of breath.   Cardiovascular:  Negative for chest pain, palpitations and leg swelling.  Gastrointestinal:  Positive for diarrhea. Negative for abdominal pain, nausea and vomiting.  Musculoskeletal:  Positive for arthralgias and myalgias.  Neurological:  Positive for headaches. Negative for dizziness and light-headedness.    Physical Exam Triage Vital Signs ED Triage Vitals  Enc Vitals Group     BP 11/29/21 1052 (!) 169/91     Pulse Rate 11/29/21 1052 72     Resp 11/29/21 1052 18     Temp 11/29/21 1052 98.1 F (36.7 C)     Temp Source 11/29/21 1052 Oral     SpO2 11/29/21 1052 97 %     Weight --      Height --      Head Circumference --      Peak Flow --      Pain Score 11/29/21 1055 8     Pain Loc --      Pain Edu? --      Excl. in [place]? --    No data found.  Updated Vital Signs BP (!) 169/91 (BP Location: Left Arm)    Pulse 72    Temp 98.1 F (36.7 C) (Oral)    Resp 18    SpO2 97%   Visual Acuity Right Eye Distance:   Left Eye Distance:   Bilateral Distance:    Right Eye Near:   Left Eye Near:    Bilateral Near:     Physical Exam Vitals reviewed.  Constitutional:      General: She is awake. She is not in acute distress.    Appearance: Normal appearance. She is well-developed. She is not ill-appearing.     Comments: Very pleasant female appears stated age in no acute distress  sitting comfortably in exam room  HENT:     Head: Normocephalic and atraumatic.     Right Ear: Tympanic membrane, ear canal and external ear normal. Tympanic membrane is not erythematous or bulging.     Left Ear: Tympanic membrane, ear canal and external ear normal. Tympanic membrane is not erythematous or bulging.     Nose:  Right Sinus: No maxillary sinus tenderness or frontal sinus tenderness.     Left Sinus: No maxillary sinus tenderness or frontal sinus tenderness.     Mouth/Throat:     Pharynx: Uvula midline. No oropharyngeal exudate or posterior oropharyngeal erythema.  Cardiovascular:     Rate and Rhythm: Normal rate and regular rhythm.     Heart sounds: Normal heart sounds, S1 normal and S2 normal. No murmur heard. Pulmonary:     Effort: Pulmonary effort is normal.     Breath sounds: Normal breath sounds. No wheezing, rhonchi or rales.     Comments: Clear to auscultation bilaterally Musculoskeletal:     Right lower leg: No edema.     Left lower leg: No edema.  Psychiatric:        Behavior: Behavior is cooperative.     UC Treatments / Results  Labs (all labs ordered are listed, but only abnormal results are displayed) Labs Reviewed  SARS CORONAVIRUS 2 (TAT 6-24 HRS)  POC INFLUENZA A AND B ANTIGEN (URGENT CARE ONLY)    EKG   Radiology No results found.  Procedures Procedures (including critical care time)  Medications Ordered in UC Medications - No data to display  Initial Impression / Assessment and Plan / UC Course  I have reviewed the triage vital signs and the nursing notes.  Pertinent labs & imaging results that were available during my care of the patient were reviewed by me and considered in my medical decision making (see chart for details).     Discussed potential utility of EKG but patient reports that she is confident that this is not her heart declined cardiac testing.  She initially agreed to chest x-ray which was ordered but when radiology  tech went to get her for x-ray she declined stating she only needed a COVID test.  Discussed with patient at length we cannot rule out additional causes of her chest discomfort without testing but patient reports her chest discomfort is minimal and she is not interested in additional testing.  Had a long conversation with patient about utility of EKG and chest x-ray but she continues to decline this testing.  Flu testing was negative.  COVID test is pending.  She was encouraged to use over-the-counter medications for symptom relief.  Discussed that if she has any worsening symptoms or if anything changes she needs to go to the emergency room to which she expressed understanding.  Final Clinical Impressions(s) / UC Diagnoses   Final diagnoses:  Upper respiratory tract infection, unspecified type  Chest discomfort     Discharge Instructions      Your flu testing was negative.  I do recommend that you consider the EKG and chest x-ray that we talked about during your visit.  If you have any changing symptoms including increased pain or shortness of breath please go to the emergency room for further evaluation.  We will contact you if your COVID test is positive.  Make sure you are drinking plenty of fluid.  Use over-the-counter medications for symptom relief including Tylenol, Mucinex, Flonase.  If you have any changing or worsening symptoms go to the ER.     ED Prescriptions   None    PDMP not reviewed this encounter.   Terrilee Croak, PA-C 11/29/21 1208

## 2021-11-29 NOTE — ED Triage Notes (Signed)
Pt states that she started on Wednesday with a headache, body aches, diarrhea, chest congestion and sore throat. C/o chest pain that increases with a deep breath.

## 2022-09-02 ENCOUNTER — Other Ambulatory Visit: Payer: Self-pay | Admitting: Internal Medicine

## 2022-09-04 LAB — CBC
HCT: 38.9 % (ref 35.0–45.0)
Hemoglobin: 12.9 g/dL (ref 11.7–15.5)
MCH: 28.3 pg (ref 27.0–33.0)
MCHC: 33.2 g/dL (ref 32.0–36.0)
MCV: 85.3 fL (ref 80.0–100.0)
MPV: 10.8 fL (ref 7.5–12.5)
Platelets: 313 10*3/uL (ref 140–400)
RBC: 4.56 10*6/uL (ref 3.80–5.10)
RDW: 14.7 % (ref 11.0–15.0)
WBC: 6.7 10*3/uL (ref 3.8–10.8)

## 2022-09-04 LAB — COMPLETE METABOLIC PANEL WITH GFR
AG Ratio: 1.4 (calc) (ref 1.0–2.5)
ALT: 12 U/L (ref 6–29)
AST: 14 U/L (ref 10–35)
Albumin: 4.2 g/dL (ref 3.6–5.1)
Alkaline phosphatase (APISO): 96 U/L (ref 37–153)
BUN/Creatinine Ratio: 15 (calc) (ref 6–22)
BUN: 9 mg/dL (ref 7–25)
CO2: 24 mmol/L (ref 20–32)
Calcium: 9.8 mg/dL (ref 8.6–10.4)
Chloride: 104 mmol/L (ref 98–110)
Creat: 0.59 mg/dL — ABNORMAL LOW (ref 0.60–0.95)
Globulin: 3 g/dL (calc) (ref 1.9–3.7)
Glucose, Bld: 85 mg/dL (ref 65–99)
Potassium: 4.3 mmol/L (ref 3.5–5.3)
Sodium: 139 mmol/L (ref 135–146)
Total Bilirubin: 0.3 mg/dL (ref 0.2–1.2)
Total Protein: 7.2 g/dL (ref 6.1–8.1)
eGFR: 91 mL/min/{1.73_m2} (ref >=60)

## 2022-09-04 LAB — LIPID PANEL
Cholesterol: 199 mg/dL (ref <200)
HDL: 45 mg/dL — ABNORMAL LOW (ref >=50)
LDL Cholesterol (Calc): 130 mg/dL (calc) — ABNORMAL HIGH
Non-HDL Cholesterol (Calc): 154 mg/dL (calc) — ABNORMAL HIGH (ref <130)
Total CHOL/HDL Ratio: 4.4 (calc) (ref <5.0)
Triglycerides: 127 mg/dL (ref <150)

## 2022-09-04 LAB — TSH: TSH: 1.4 mIU/L (ref 0.40–4.50)

## 2022-09-04 LAB — MICROALBUMIN / CREATININE URINE RATIO

## 2022-09-17 ENCOUNTER — Encounter (HOSPITAL_COMMUNITY): Payer: Self-pay

## 2022-09-17 ENCOUNTER — Emergency Department (HOSPITAL_COMMUNITY): Payer: Medicare HMO

## 2022-09-17 ENCOUNTER — Other Ambulatory Visit: Payer: Self-pay

## 2022-09-17 ENCOUNTER — Emergency Department (HOSPITAL_COMMUNITY)
Admission: EM | Admit: 2022-09-17 | Discharge: 2022-09-17 | Disposition: A | Discharge status: Home / Self Care | Payer: Medicare HMO | Attending: Emergency Medicine | Admitting: Emergency Medicine

## 2022-09-17 DIAGNOSIS — R200 Anesthesia of skin: Secondary | ICD-10-CM | POA: Insufficient documentation

## 2022-09-17 DIAGNOSIS — R519 Headache, unspecified: Secondary | ICD-10-CM | POA: Insufficient documentation

## 2022-09-17 DIAGNOSIS — H6122 Impacted cerumen, left ear: Secondary | ICD-10-CM | POA: Diagnosis not present

## 2022-09-17 DIAGNOSIS — R202 Paresthesia of skin: Secondary | ICD-10-CM | POA: Diagnosis present

## 2022-09-17 LAB — COMPREHENSIVE METABOLIC PANEL
ALT: 14 U/L (ref 0–44)
AST: 17 U/L (ref 15–41)
Albumin: 4 g/dL (ref 3.5–5.0)
Alkaline Phosphatase: 99 U/L (ref 38–126)
Anion gap: 8 (ref 5–15)
BUN: 10 mg/dL (ref 8–23)
CO2: 26 mmol/L (ref 22–32)
Calcium: 9.7 mg/dL (ref 8.9–10.3)
Chloride: 102 mmol/L (ref 98–111)
Creatinine, Ser: 0.65 mg/dL (ref 0.44–1.00)
GFR, Estimated: >60 mL/min (ref >60)
Glucose, Bld: 92 mg/dL (ref 70–99)
Potassium: 4.8 mmol/L (ref 3.5–5.1)
Sodium: 136 mmol/L (ref 135–145)
Total Bilirubin: 0.5 mg/dL (ref 0.3–1.2)
Total Protein: 8.5 g/dL — ABNORMAL HIGH (ref 6.5–8.1)

## 2022-09-17 LAB — CBC
HCT: 40.9 % (ref 36.0–46.0)
Hemoglobin: 13 g/dL (ref 12.0–15.0)
MCH: 27.8 pg (ref 26.0–34.0)
MCHC: 31.8 g/dL (ref 30.0–36.0)
MCV: 87.4 fL (ref 80.0–100.0)
Platelets: 296 10*3/uL (ref 150–400)
RBC: 4.68 MIL/uL (ref 3.87–5.11)
RDW: 15.1 % (ref 11.5–15.5)
WBC: 8.6 10*3/uL (ref 4.0–10.5)
nRBC: 0 % (ref 0.0–0.2)

## 2022-09-17 LAB — PROTIME-INR
INR: 1.1 (ref 0.8–1.2)
Prothrombin Time: 14.1 seconds (ref 11.4–15.2)

## 2022-09-17 MED ORDER — ACETAMINOPHEN 500 MG PO TABS
1000.0000 mg | ORAL_TABLET | Freq: Once | ORAL | Status: AC
  Administered 2022-09-17: 1000 mg via ORAL
  Filled 2022-09-17: qty 2
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
## 2022-09-17 NOTE — Discharge Instructions (Signed)
1.  Time I suspect either early shingles or trigeminal neuralgia.  You have educational information on these conditions.  Review your information to see if you are developing any concerning signs or symptoms that would necessitate recheck in the emergency department.  Concerning shingles, you need to return immediately or see your doctor to get medication for shingles if you see small water blisters on your face, scalp or ear in the area of your pain.  The sooner it is treated the better response to treatment. 2.  At this time I do not suspect a stroke.  However, if you develop any problems such as loss of vision, double vision, weakness or numbness in your arms or your legs, unusual dizziness or other concerning changes, return to the emergency department immediately.  Strokes are best treated as soon as possible after they occur.

## 2022-09-17 NOTE — ED Provider Triage Note (Signed)
Emergency Medicine Provider Triage Evaluation Note  Teresa Keller , a 81 y.o. female  was evaluated in triage.  Pt complains of headache. Report recurrent headache but has worsening L sided headache radiates to L neck about an hr ago. Now noticing puffiness/tingling sensation to L side of face.  No vision changes, n/v arm or leg numbness or weakness.  No hx of prior stroke  Review of Systems  Positive: As above Negative: As above  Physical Exam  BP (!) 142/79 (BP Location: Left Arm)   Pulse 73   Temp 98.1 F (36.7 C) (Oral)   Resp 18   SpO2 97%  Gen:   Awake, no distress   Resp:  Normal effort  MSK:   Moves extremities without difficulty  Other:    Medical Decision Making  Medically screening exam initiated at 4:09 PM.  Appropriate orders placed.  MAHKAYLA Keller was informed that the remainder of the evaluation will be completed by another provider, this initial triage assessment does not replace that evaluation, and the importance of remaining in the ED until their evaluation is complete.     Domenic Moras, PA-C 09/17/22 1610

## 2022-09-17 NOTE — ED Notes (Signed)
Dr. Oswald Hillock bedside in triage.

## 2022-09-17 NOTE — ED Provider Notes (Signed)
[facility] DEPT Provider Note   CSN: [id number] Arrival date & time: 09/17/22  1539     History  Chief Complaint  Patient presents with   Face Numbness    Teresa Keller is a 81 y.o. female.  HPI Reports he is having some tingling and irritated feeling on her left cheek and by the side of her eye.  She reports that she got a fairly sudden sustained sharp pain that came around on her temple and her scalp and came around towards her left eye and cheek and then also somewhat down back her neck behind the ear.  She reports it was a pretty sharp and uncomfortable pain that lasted about 3 minutes or so.  Once that pain abated, she continued to have a feeling of puffiness and irritation on her left cheek and beside the eye.  She denies any other associated symptoms.  She did not experience any visual changes.  No difficulty with gait.  She is at baseline for ambulation.  No imbalance.  No weakness numbness or tingling of her extremities.  She reports at one point she had a diagnosis of "nerve pains" on the other side of the face.    Home Medications Prior to Admission medications   Medication Sig Start Date End Date Taking? Authorizing Provider  albuterol (VENTOLIN HFA) 108 (90 Base) MCG/ACT inhaler Inhale 1-2 puffs into the lungs every 6 (six) hours as needed for wheezing or shortness of breath. 05/14/20   Enrique Sack, FNP  pantoprazole (PROTONIX) 40 MG tablet Take 1 tablet (40 mg total) by mouth 2 (two) times daily. 05/20/20   Regalado, Belkys A, MD  gabapentin (NEURONTIN) 100 MG capsule Take 2 capsules (200 mg total) by mouth 3 (three) times daily as needed. Patient not taking: Reported on 10/14/2019 09/08/19 05/03/20  Virgel Manifold, MD      Allergies    Codeine and Elemental sulfur    Review of Systems   Review of Systems  Physical Exam Updated Vital Signs BP 130/72 (BP Location: Right Arm)   Pulse 65   Temp (!) 97.3 F (36.3 C) (Oral)   Resp  18   SpO2 100%  Physical Exam Constitutional:      Comments: Patient is alert nontoxic.  Clinically well in appearance.  HENT:     Head: Normocephalic and atraumatic.     Comments: Patient has very subtle fullness over the left zygoma compared to the right.  This is not erythematous.  No induration.  No lesions associated.  Patient Dors is mild tenderness to palpation.  Skin structures are normal under close inspection.  Does endorse discomfort as well to palpation over the temple and the parietal scalp on the left.  No lesions visible to close inspection.    Ears:     Comments: Right TM normal.  Left ear canal has mild cerumen that is dry obscuring part of the TM.  No apparent abnormality of TM.  The visible ear canal does not have any lesions or inflammation.  The pinnas are normal without lesions or inflammation.    Nose: Nose normal.  Eyes:     Extraocular Movements: Extraocular movements intact.     Conjunctiva/sclera: Conjunctivae normal.     Pupils: Pupils are equal, round, and reactive to light.     Comments: No proptosis.  Eye motions are symmetric without nystagmus.  Pupils are symmetric and responsive.  Neck:     Comments: Neck is supple.  No lymphadenopathy.  Cardiovascular:     Rate and Rhythm: Normal rate and regular rhythm.  Pulmonary:     Effort: Pulmonary effort is normal.     Breath sounds: Normal breath sounds.  Abdominal:     General: There is no distension.     Palpations: Abdomen is soft.     Tenderness: There is no abdominal tenderness. There is no guarding.  Musculoskeletal:        General: No swelling or tenderness. Normal range of motion.     Right lower leg: No edema.     Left lower leg: No edema.  Skin:    General: Skin is warm and dry.  Neurological:     General: No focal deficit present.     Mental Status: She is oriented to person, place, and time.     Cranial Nerves: No cranial nerve deficit.     Sensory: No sensory deficit.     Motor: No weakness.      Coordination: Coordination normal.     Comments: Cranial nerves II through XII are intact.  Extraocular motions are normal.  She does not endorse any sensory differential left to right on extremities.  Her strength on extremities is symmetric 5\5.  Speech is clear.  Cognitive function normal.  Psychiatric:        Mood and Affect: Mood normal.     ED Results / Procedures / Treatments   Labs (all labs ordered are listed, but only abnormal results are displayed) Labs Reviewed  COMPREHENSIVE METABOLIC PANEL - Abnormal; Notable for the following components:      Result Value   Total Protein 8.5 (*)    All other components within normal limits  CBC  PROTIME-INR    EKG EKG Interpretation  Date/Time:  Thursday September 17 2022 18:49:19 EDT Ventricular Rate:  64 PR Interval:  152 QRS Duration: 87 QT Interval:  409 QTC Calculation: 422 R Axis:   178 Text Interpretation: Right and left arm electrode reversal, interpretation assumes no reversal Sinus rhythm normal, no sig change from previous Confirmed by Charlesetta Shanks [phone]) on 09/17/2022 8:40:16 PM  Radiology CT HEAD WO CONTRAST  Result Date: 09/17/2022 CLINICAL DATA:  Headache EXAM: CT HEAD WITHOUT CONTRAST TECHNIQUE: Contiguous axial images were obtained from the base of the skull through the vertex without intravenous contrast. RADIATION DOSE REDUCTION: This exam was performed according to the departmental dose-optimization program which includes automated exposure control, adjustment of the mA and/or kV according to patient size and/or use of iterative reconstruction technique. COMPARISON:  CT head 05/06/2018 FINDINGS: Brain: No evidence of acute infarction, hemorrhage, hydrocephalus, extra-axial collection or mass lesion/mass effect. There is minimal periventricular white matter hypodensity, likely chronic small vessel ischemic change. Bilateral basal ganglia calcifications are again seen. Vascular: No hyperdense vessel or  unexpected calcification. Skull: Normal. Negative for fracture or focal lesion. Sinuses/Orbits: No acute finding. Other: None. IMPRESSION: No acute intracranial abnormality. Electronically Signed   By: Ronney Asters M.D.   On: 09/17/2022 16:36    Procedures Procedures    Medications Ordered in ED Medications  acetaminophen (TYLENOL) tablet 1,000 mg (1,000 mg Oral Given 09/17/22 1913)    ED Course/ Medical Decision Making/ A&P Clinical Course as of 09/17/22 2056  Thu Sep 17, 2022  1608 I was asked to evaluate this patient at bedside by the triage team.  Patient has multiple days of left-sided headache and onset of left-sided facial numbness and tingling at some point today though patient does not remember when.  She does endorse the symptoms have been coming and going over the past few weeks.  It is a stinging sensation in her left anterior face. No focal neurologic deficits at this time. She endorses a chronic right sided facial droop and slurred speech at baseline. [CC]    Clinical Course User Index [CC] Tretha Sciara, MD                           Medical Decision Making Risk OTC drugs.   Patient presents as outlined.  Differential diagnosis includes CVA\TIA\trigeminal neuralgia\early zoster\other neuralgia.  Patient's exam is for normal neurologic exam.  She does not meet code stroke criteria.  CT head obtained without any acute findings.  I agree with labs to rule out anemia or electrolyte derangement.  Basic labs within normal limits.  Patient does not have any electrolyte derangement or anemia.  Vital signs are stable patient is normotensive with regular heart rate.  At this time low suspicion for CVA.  CT head is negative for acute changes, patient's neurologic exam is normal and history does not suggest stuttering TIA symptoms.  Patient's predominant symptom was pain that has a lancinating burning quality over the temple and parietal scalp into the zygoma and cheek and also  the back of the neck at times.  With tingling sensation and some discomfort in the cheek and beside the eye, I have carefully reviewed signs and symptoms of shingles and the necessity for expeditious return if any lesions are identified.  Patient and her daughters at bedside are made aware that treatment at early stages is ideal.  Also carefully reviewed stroke symptoms and the need for immediate return due to superior outcomes in early treatment of stroke.  They voiced understanding.  At this time, patient does not have significant pain.  She has been alert and in no distress.  We discussed use of extra strength Tylenol if needed for pain.  If patient developed increasing lancinating pain more consistent with trigeminal neuralgia or a significant facial nerve neuralgia, with reevaluation by PCP or neurology other treatment is an option.  They voiced understanding of this plan.        Final Clinical Impression(s) / ED Diagnoses Final diagnoses:  Facial pain, acute  Facial paresthesia    Rx / DC Orders ED Discharge Orders     None         Charlesetta Shanks, MD 09/17/22 2102

## 2022-09-17 NOTE — ED Notes (Signed)
Domenic Moras, PA bedside in triage.

## 2022-10-07 ENCOUNTER — Encounter (HOSPITAL_COMMUNITY): Payer: Self-pay

## 2022-10-07 ENCOUNTER — Ambulatory Visit (HOSPITAL_COMMUNITY)
Admission: EM | Admit: 2022-10-07 | Discharge: 2022-10-07 | Disposition: A | Discharge status: Home / Self Care | Payer: Medicare HMO | Attending: Family Medicine | Admitting: Family Medicine

## 2022-10-07 ENCOUNTER — Ambulatory Visit (HOSPITAL_COMMUNITY)
Admission: EM | Admit: 2022-10-07 | Discharge: 2022-10-07 | Disposition: A | Discharge status: Home / Self Care | Payer: BC Managed Care – PPO | Attending: Family Medicine | Admitting: Family Medicine

## 2022-10-07 ENCOUNTER — Ambulatory Visit (HOSPITAL_BASED_OUTPATIENT_CLINIC_OR_DEPARTMENT_OTHER)
Admit: 2022-10-07 | Discharge: 2022-10-07 | Disposition: A | Discharge status: Home / Self Care | Payer: Medicare HMO | Attending: Family Medicine | Admitting: Family Medicine

## 2022-10-07 DIAGNOSIS — M79609 Pain in unspecified limb: Secondary | ICD-10-CM | POA: Diagnosis not present

## 2022-10-07 DIAGNOSIS — M79605 Pain in left leg: Secondary | ICD-10-CM | POA: Diagnosis present

## 2022-10-07 LAB — CBG MONITORING, ED: Glucose-Capillary: 121 mg/dL — ABNORMAL HIGH (ref 70–99)

## 2022-10-07 MED ORDER — TRAMADOL HCL 50 MG PO TABS: 50.0000 mg | ORAL_TABLET | Freq: Four times a day (QID) | ORAL | 0 refills | Status: AC | PRN

## 2022-10-07 MED ORDER — ONDANSETRON 4 MG PO TBDP: 4.0000 mg | ORAL_TABLET | Freq: Three times a day (TID) | ORAL | 0 refills | Status: AC | PRN

## 2022-10-07 NOTE — Discharge Instructions (Addendum)
10/07/2022  3:30 PM at [place]

## 2022-10-07 NOTE — ED Provider Notes (Signed)
[name]   [id number] 10/07/22 Arrival Time: 2458  ASSESSMENT & PLAN:   1. Left leg pain    . U/S negative for DVT.  CBG slightly elevated; non-fasting.  Possible tendonitis; discussed. CAM boot to assist ambulation and for comfort. Continue Aleve BID with food. Tylenol as well.  Meds ordered this encounter  Medications   traMADol (ULTRAM) 50 MG tablet    Sig: Take 1 tablet (50 mg total) by mouth every 6 (six) hours as needed.    Dispense:  20 tablet    Refill:  0   ondansetron (ZOFRAN-ODT) 4 MG disintegrating tablet    Sig: Take 1 tablet (4 mg total) by mouth every 8 (eight) hours as needed for nausea or vomiting.    Dispense:  15 tablet    Refill:  0   [place] Controlled Substances Registry consulted for this patient. I feel the risk/benefit ratio today is favorable for proceeding with this prescription for a controlled substance. Medication sedation precautions given.    Discharge Instructions      Follow up with orthopaedics.  [facility] [address], [place], [place] [address] Phone: [phone]  [facility]: Walk in M-F 8 am to 8 pm [place], [place], [place] [address] Phone: [phone]  [facility]: By Appointment M-F 8 - 5 pm Walk-in M-F 5:30 - 9 pm, Saturday 9 am - 2 pm, Sunday 10 am - 2 pm [place], [place], [place] [address] Phone: [phone]      Reviewed expectations re: course of current medical issues. Questions answered. Outlined signs and symptoms indicating need for more acute intervention. Patient verbalized understanding. After Visit Summary given.   SUBJECTIVE: History from: patient. [place] is a 81 y.o. female who reports LLE swelling and pain; gradual; over past two weeks. Her PCP was worried about possibility of blood clot. Sent here for evaluation. Reports pain with ambulation; specifically LLE. No injury/trauma reported. No h/o DVT. No CP/SOB.         Past Surgical History:  Procedure Laterality Date   CHOLECYSTECTOMY   october 2012   ERCP   08/22/2012    Procedure: ENDOSCOPIC RETROGRADE CHOLANGIOPANCREATOGRAPHY (ERCP);  Surgeon: Inda Castle, MD;  Location: [place];  Service: Endoscopy;  Laterality: N/A;   ERCP N/A 07/31/2013    Procedure: ENDOSCOPIC RETROGRADE CHOLANGIOPANCREATOGRAPHY (ERCP);  Surgeon: Irene Shipper, MD;  Location: [facility] ENDOSCOPY;  Service: Endoscopy;  Laterality: N/A;   ESOPHAGOGASTRODUODENOSCOPY   04/25/13    Surveillance at [facility] - Normal appearing ampulla; No residual adenomatous tissue seen   EUS   11/21/2012    Procedure: UPPER ENDOSCOPIC ULTRASOUND (EUS) LINEAR;  Surgeon: Milus Banister, MD;  Location: [facility] ENDOSCOPY;  Service: Endoscopy;  Laterality: N/A;   HERNIA REPAIR       TUBAL LIGATION       VENTRAL HERNIA REPAIR   08/2011    s/p chole, at trochar site, by Dr. Lucia Gaskins        OBJECTIVE:      Vitals:    10/07/22 1423  BP: (!) 156/74  Pulse: 74  Resp: 12  Temp: 98 F (36.7 C)  TempSrc: Oral  SpO2: 94%    General appearance: alert; no distress HEENT: ; AT Neck: supple with FROM Resp: unlabored respirations Extremities: LLE: warm with well perfused appearance; mild swelling around calf when compared to LLE; mild TTP over calf; 2+ DP pulse; reports pain over lateral  lower leg extending down past posterior lateral ankle towards foot; no specific bony tenderness Skin: warm and dry; no visible rashes Neurologic: normal sensation and strength of LLE Psychological: alert and cooperative; normal mood and affect           Allergies  Allergen Reactions   Codeine Nausea And Vomiting   Elemental Sulfur Nausea And Vomiting          Past Medical History:  Diagnosis Date   AKI (acute kidney injury) ([place])      a. Cr ~1 range in 05/2020, previously 0.6 range.   Arthritis      A. Hip & neck - takes ibuprofen '400mg'$  bid.   Chest pain      a. 12/2013 ETT: Ex time - 4:00, upsloping ST  depression-->low risk. b. Nuc 05/2020 low risk, normal LVEF.   Common bile duct stone     Diverticulitis     Essential hypertension      high BP while having pain, normal BP at other times   HLD (hyperlipidemia)      Social History         Socioeconomic History   Marital status: Widowed      Spouse name: Not on file   Number of children: 2   Years of education: Not on file   Highest education level: Not on file  Occupational History   Not on file  Tobacco Use   Smoking status: Never   Smokeless tobacco: Never  Vaping Use   Vaping Use: Never used  Substance and Sexual Activity   Alcohol use: No   Drug use: No   Sexual activity: Never  Other Topics Concern   Not on file  Social History Narrative    [place]ives in [place].  Still works for [employer] as a Sports coach.  Active in and around her house.    Social Determinants of Health    Financial Resource Strain: Not on file  Food Insecurity: Not on file  Transportation Needs: Not on file  Physical Activity: Not on file  Stress: Not on file  Social Connections: Not on file         Family History  Problem Relation Age of Onset   Hypertension Mother          died @ 78   Colon cancer Mother 15   Alcohol abuse Father          died in his 85's   Hypertension Sister     Diabetes Sister     Diabetes Brother           Past Surgical History:  Procedure Laterality Date   CHOLECYSTECTOMY   october 2012   ERCP   08/22/2012    Procedure: ENDOSCOPIC RETROGRADE CHOLANGIOPANCREATOGRAPHY (ERCP);  Surgeon: Inda Castle, MD;  Location: [facility];  Service: Endoscopy;  Laterality: N/A;   ERCP N/A 07/31/2013    Procedure: ENDOSCOPIC RETROGRADE CHOLANGIOPANCREATOGRAPHY (ERCP);  Surgeon: Irene Shipper, MD;  Location: [facility] ENDOSCOPY;  Service: Endoscopy;  Laterality: N/A;   ESOPHAGOGASTRODUODENOSCOPY   04/25/13    Surveillance at [facility] - Normal appearing ampulla; No residual adenomatous tissue seen   EUS   11/21/2012     Procedure: UPPER ENDOSCOPIC ULTRASOUND (EUS) LINEAR;  Surgeon: Milus Banister, MD;  Location: [facility] ENDOSCOPY;  Service: Endoscopy;  Laterality: N/A;   HERNIA REPAIR       TUBAL LIGATION       VENTRAL HERNIA REPAIR   08/2011  s/p chole, at trochar site, by Dr. Loistine Simas, MD 10/07/22 1726

## 2022-10-07 NOTE — Discharge Instructions (Signed)
Follow up with orthopaedics.  [facility] [address] [place], [place] [address] Phone: [phone]  [facility]: Walk in M-F 8 am to 8 pm [place], [place], [place] [address] Phone: [phone]  [facility]: By Appointment M-F 8 - 5 pm Walk-in M-F 5:30 - 9 pm, Saturday 9 am - 2 pm, Sunday 10 am - 2 pm [place], [place], [place] [address] Phone: [phone]

## 2022-10-07 NOTE — ED Triage Notes (Signed)
Pt is here for bilateral leg pain for x 2wks and getting worse as per pt.

## 2022-10-07 NOTE — ED Triage Notes (Signed)
Pt returns to urgent care demanding a continuation from the first visit earlier today. No new complaints. DVT study was neg.

## 2022-10-13 NOTE — ED Provider Notes (Signed)
  [place]   [id number] 10/07/22 Arrival Time: 8483  See previous note from today.      Vanessa Kick, MD 10/14/22 [phone]

## 2022-10-27 NOTE — Progress Notes (Signed)
PT/INR was ordered as part of the stroke work up with this patient with a complaint of facial numbness.

## 2023-07-07 ENCOUNTER — Encounter (HOSPITAL_COMMUNITY): Payer: Self-pay

## 2023-07-07 ENCOUNTER — Emergency Department (HOSPITAL_COMMUNITY): Payer: Medicare HMO

## 2023-07-07 ENCOUNTER — Ambulatory Visit (HOSPITAL_COMMUNITY)
Admission: EM | Admit: 2023-07-07 | Discharge: 2023-07-07 | Disposition: A | Discharge status: Home / Self Care | Payer: Medicare HMO | Attending: Emergency Medicine | Admitting: Emergency Medicine

## 2023-07-07 ENCOUNTER — Other Ambulatory Visit: Payer: Self-pay

## 2023-07-07 ENCOUNTER — Emergency Department (HOSPITAL_COMMUNITY)
Admission: EM | Admit: 2023-07-07 | Discharge: 2023-07-07 | Disposition: A | Discharge status: Home / Self Care | Payer: Medicare HMO | Attending: Emergency Medicine | Admitting: Emergency Medicine

## 2023-07-07 DIAGNOSIS — T782XXA Anaphylactic shock, unspecified, initial encounter: Secondary | ICD-10-CM | POA: Insufficient documentation

## 2023-07-07 DIAGNOSIS — T7840XA Allergy, unspecified, initial encounter: Secondary | ICD-10-CM

## 2023-07-07 DIAGNOSIS — T63441A Toxic effect of venom of bees, accidental (unintentional), initial encounter: Secondary | ICD-10-CM | POA: Diagnosis not present

## 2023-07-07 DIAGNOSIS — R079 Chest pain, unspecified: Secondary | ICD-10-CM

## 2023-07-07 DIAGNOSIS — I10 Essential (primary) hypertension: Secondary | ICD-10-CM | POA: Diagnosis not present

## 2023-07-07 LAB — COMPREHENSIVE METABOLIC PANEL
ALT: 17 U/L (ref 0–44)
AST: 35 U/L (ref 15–41)
Albumin: 3.2 g/dL — ABNORMAL LOW (ref 3.5–5.0)
Alkaline Phosphatase: 82 U/L (ref 38–126)
Anion gap: 15 (ref 5–15)
BUN: 8 mg/dL (ref 8–23)
CO2: 20 mmol/L — ABNORMAL LOW (ref 22–32)
Calcium: 9.4 mg/dL (ref 8.9–10.3)
Chloride: 105 mmol/L (ref 98–111)
Creatinine, Ser: 0.95 mg/dL (ref 0.44–1.00)
GFR, Estimated: >60 mL/min (ref >60)
Glucose, Bld: 133 mg/dL — ABNORMAL HIGH (ref 70–99)
Potassium: 3.6 mmol/L (ref 3.5–5.1)
Sodium: 140 mmol/L (ref 135–145)
Total Bilirubin: 0.9 mg/dL (ref 0.3–1.2)
Total Protein: 6.6 g/dL (ref 6.5–8.1)

## 2023-07-07 LAB — CBC WITH DIFFERENTIAL/PLATELET
Abs Immature Granulocytes: 0.16 10*3/uL — ABNORMAL HIGH (ref 0.00–0.07)
Basophils Absolute: 0.1 10*3/uL (ref 0.0–0.1)
Basophils Relative: 0 %
Eosinophils Absolute: 0 10*3/uL (ref 0.0–0.5)
Eosinophils Relative: 0 %
HCT: 40.1 % (ref 36.0–46.0)
Hemoglobin: 12.6 g/dL (ref 12.0–15.0)
Immature Granulocytes: 1 %
Lymphocytes Relative: 26 %
Lymphs Abs: 3.4 10*3/uL (ref 0.7–4.0)
MCH: 27.2 pg (ref 26.0–34.0)
MCHC: 31.4 g/dL (ref 30.0–36.0)
MCV: 86.6 fL (ref 80.0–100.0)
Monocytes Absolute: 0.6 10*3/uL (ref 0.1–1.0)
Monocytes Relative: 5 %
Neutro Abs: 9 10*3/uL — ABNORMAL HIGH (ref 1.7–7.7)
Neutrophils Relative %: 68 %
Platelets: 312 10*3/uL (ref 150–400)
RBC: 4.63 MIL/uL (ref 3.87–5.11)
RDW: 15.3 % (ref 11.5–15.5)
WBC: 13.3 10*3/uL — ABNORMAL HIGH (ref 4.0–10.5)
nRBC: 0 % (ref 0.0–0.2)

## 2023-07-07 LAB — TROPONIN I (HIGH SENSITIVITY)
Troponin I (High Sensitivity): 5 ng/L (ref <18)
Troponin I (High Sensitivity): 4 ng/L (ref <18)

## 2023-07-07 MED ORDER — EPINEPHRINE 0.3 MG/0.3ML IJ SOAJ
INTRAMUSCULAR | Status: AC
  Filled 2023-07-07: qty 0.3

## 2023-07-07 MED ORDER — METHYLPREDNISOLONE SODIUM SUCC 125 MG IJ SOLR
125.0000 mg | Freq: Once | INTRAMUSCULAR | Status: AC
  Administered 2023-07-07: 125 mg via INTRAVENOUS

## 2023-07-07 MED ORDER — FAMOTIDINE 20 MG PO TABS: 20.0000 mg | ORAL_TABLET | Freq: Every day | ORAL | 0 refills | Status: AC

## 2023-07-07 MED ORDER — EPINEPHRINE 0.3 MG/0.3ML IJ SOAJ: 0.3000 mg | INTRAMUSCULAR | 0 refills | Status: AC | PRN

## 2023-07-07 MED ORDER — PREDNISONE 20 MG PO TABS: 40.0000 mg | ORAL_TABLET | Freq: Every day | ORAL | 0 refills | Status: AC

## 2023-07-07 MED ORDER — SODIUM CHLORIDE 0.9 % IV BOLUS
250.0000 mL | Freq: Once | INTRAVENOUS | Status: AC
  Administered 2023-07-07: 250 mL via INTRAVENOUS

## 2023-07-07 MED ORDER — EPINEPHRINE 0.3 MG/0.3ML IJ SOAJ
0.3000 mg | Freq: Once | INTRAMUSCULAR | Status: AC
  Administered 2023-07-07: 0.3 mg via INTRAMUSCULAR

## 2023-07-07 MED ORDER — DIPHENHYDRAMINE HCL 25 MG PO TABS: 25.0000 mg | ORAL_TABLET | Freq: Four times a day (QID) | ORAL | 0 refills | Status: AC | PRN

## 2023-07-07 MED ORDER — METHYLPREDNISOLONE SODIUM SUCC 125 MG IJ SOLR
INTRAMUSCULAR | Status: AC
  Filled 2023-07-07: qty 2

## 2023-07-07 MED ORDER — ASPIRIN 81 MG PO CHEW
324.0000 mg | CHEWABLE_TABLET | Freq: Once | ORAL | Status: AC
  Administered 2023-07-07: 324 mg via ORAL
  Filled 2023-07-07: qty 4

## 2023-07-07 NOTE — ED Notes (Signed)
Patient is being discharged from the Urgent Care and sent to the Emergency Department via [facility] . Per provider, patient is in need of higher level of care due to anaphylaxis, bee sting. Patient is aware and verbalizes understanding of plan of care.  Vitals:   07/07/23 1111  BP: (!) 161/111  Pulse: (!) 102  Resp: (!) 28  Temp: 98.5 F (36.9 C)  SpO2: 98%

## 2023-07-07 NOTE — ED Provider Notes (Signed)
[place] EMERGENCY DEPARTMENT AT [facility] Provider Note   CSN: [id number] Arrival date & time: 07/07/23  1148     History  Chief Complaint  Patient presents with   Allergic Reaction    [place] is a 82 y.o. female w/ pmhx of hyperlipidemia, hypertension, and arthritis presenting after being seen at urgent care for allergic reaction after being stung three times by bee - right foot, neck and right upper arm. After being stung pt experiences mild chest pain and shortness of breath, tingling in her throat and itchy hands. She was originally seen at [facility], and given x2 epi, steroids, and benadryl prior to arrival in ER. Conts to have mild chest pain. Upon arrival, pt is reporting no SOB, no throat swelling or itching. She has never had an allergic reaction before, she is only reporting allergy to codeine. Denies other past cardiac history. Pt denies smoking, drugs or EtOH use.    Allergic Reaction      Home Medications Prior to Admission medications   Medication Sig Start Date End Date Taking? Authorizing Provider  albuterol (VENTOLIN HFA) 108 (90 Base) MCG/ACT inhaler Inhale 1-2 puffs into the lungs every 6 (six) hours as needed for wheezing or shortness of breath. 05/14/20   Lurline Idol, FNP  ondansetron (ZOFRAN-ODT) 4 MG disintegrating tablet Take 1 tablet (4 mg total) by mouth every 8 (eight) hours as needed for nausea or vomiting. 10/07/22   Mardella Layman, MD  pantoprazole (PROTONIX) 40 MG tablet Take 1 tablet (40 mg total) by mouth 2 (two) times daily. 05/20/20   Regalado, Belkys A, MD  traMADol (ULTRAM) 50 MG tablet Take 1 tablet (50 mg total) by mouth every 6 (six) hours as needed. 10/07/22   Mardella Layman, MD  gabapentin (NEURONTIN) 100 MG capsule Take 2 capsules (200 mg total) by mouth 3 (three) times daily as needed. Patient not taking: Reported on 10/14/2019 09/08/19 05/03/20  Raeford Razor, MD      Allergies    Codeine and Elemental sulfur     Review of Systems   Review of Systems  Respiratory:  Positive for chest tightness. Negative for shortness of breath.     Physical Exam Updated Vital Signs BP (!) 151/75 (BP Location: Left Arm)   Pulse 98   Temp 97.8 F (36.6 C) (Oral)   Resp 20   SpO2 100%  Physical Exam Vitals and nursing note reviewed.  Constitutional:      General: She is not in acute distress.    Appearance: She is not toxic-appearing.  HENT:     Head: Normocephalic and atraumatic.  Eyes:     General: No scleral icterus.    Conjunctiva/sclera: Conjunctivae normal.  Cardiovascular:     Rate and Rhythm: Normal rate and regular rhythm.     Pulses: Normal pulses.     Heart sounds: Normal heart sounds.  Pulmonary:     Effort: Pulmonary effort is normal. No respiratory distress.     Breath sounds: Normal breath sounds.  Abdominal:     General: Abdomen is flat. Bowel sounds are normal.     Palpations: Abdomen is soft.     Tenderness: There is no abdominal tenderness.  Skin:    General: Skin is warm and dry.     Capillary Refill: Capillary refill takes less than 2 seconds.     Findings: No lesion.     Comments: Mild erythema on dorsal aspect of right foot, no edema, no hives.  Neurological:     General: No focal deficit present.     Mental Status: She is alert and oriented to person, place, and time. Mental status is at baseline.     ED Results / Procedures / Treatments   Labs (all labs ordered are listed, but only abnormal results are displayed) Labs Reviewed  CBC WITH DIFFERENTIAL/PLATELET  COMPREHENSIVE METABOLIC PANEL  TROPONIN I (HIGH SENSITIVITY)    EKG EKG Interpretation Date/Time:  Wednesday July 07 2023 11:58:04 EDT Ventricular Rate:  90 PR Interval:  150 QRS Duration:  93 QT Interval:  368 QTC Calculation: 451 R Axis:   16  Text Interpretation: Sinus rhythm Abnormal R-wave progression, early transition Minimal ST depression, lateral leads Confirmed by Margarita Grizzle [id number])  on 07/07/2023 12:01:53 PM  Radiology No results found.  Procedures Procedures    Medications Ordered in ED Medications  aspirin chewable tablet 324 mg (has no administration in time range)    ED Course/ Medical Decision Making/ A&P Clinical Course as of 07/07/23 1523  Wed Jul 07, 2023  1242 Reassessed patient at bedside. No signs of respiratory distress. No long has a rash. Lungs clear to auscultation bilaterally without stridor or wheeze. Airway patent. [CA]  1255 Reassessed. Patient continues to have only mild chest pain, otherwise her symptoms have not worsened or returned. Pt and daughter educated on plan to observe in ER x4hrs, they agree and understand.  [JB]  1323 Reassessed, pt cont to have mild chest pain. No sign of respiratory distress, no new or worsening sx.  Lungs clear bilaterally w/ no stridor or wheeze. Airway patent [JB]  1501 Reassessed, pt reports no CP, SOB. Lungs clear to auscultation b/l. No sign of respiratory distress.  [JB]  1513 Troponin I (High Sensitivity) [JB]    Clinical Course User Index [CA] Mannie Stabile, PA-C [JB] Donyale Falcon, Horald Chestnut, PA-C                                 Medical Decision Making Amount and/or Complexity of Data Reviewed Labs: ordered. Decision-making details documented in ED Course. Radiology: ordered.   This patient presents to the ED for concern of allergic reaction, this involves an extensive number of treatment options, and is a complaint that carries with it a high risk of complications and morbidity.  The differential diagnosis includes hives, URI, ASC, atypical chest pain, anaphylaxis    Co morbidities that complicate the patient evaluation  HTN HLD OA   Additional history obtained:  Additional history obtained from daugther at bediside with patient  External records from outside source obtained and reviewed including [place] visit note    Lab Tests:  I Ordered, and personally interpreted labs.  The pertinent  results include:   Cbc unremarkable  Cmp unremarkable  Trop unremarkable  Re-peat trop ordered PENDING    Imaging Studies ordered:  I ordered imaging studies including Chest xray   I independently visualized and interpreted imaging which showed unremarkable  I agree with the radiologist interpretation   Cardiac Monitoring: / EKG:  The patient was maintained on a cardiac monitor.  I personally viewed and interpreted the cardiac monitored which showed an underlying rhythm of: sinus, minimal ST depression in lateral leads BP elevated and vitals otherwise stable upon arrival    Consultations Obtained:  None    Problem List / ED Course / Critical interventions / Medication management  Pt presenting with allergic reaction, seen  at [facility]. Prior to arrival received x2 epi, steroids, and benadryl. She is reporting mild chest pain over sternum thus getting cardiac enzymes and chest xray, but otherwise not reporting any sx. Reports hives have resolves along with itching and throat burning, sx resolved after epi.  Received ASA d/t CP Reevaluation of the patient after these medicines showed that the patient improved I have reviewed the patients home medicines and have made adjustments as needed   Plan Pt will be observed in the ER for 4 hours post epi injection and starting workup for chest pain. Pt discharge is pending continued resolution of symptoms and a repeat negative troponin. F/u w/ PCP for todays visit findings. Return to the ER if sx worsen or continue.  Epi-pen sent to pharmacy and script for five days of steroids.   Pts care was signed off to oncoming provider at shift change.         Final Clinical Impression(s) / ED Diagnoses Final diagnoses:  None    Rx / DC Orders ED Discharge Orders     None         Smitty Knudsen, PA-C 07/07/23 1523    Margarita Grizzle, MD 07/12/23 1055

## 2023-07-07 NOTE — ED Triage Notes (Signed)
Pt states stung by a bee 20 minutes ago. Pt c/o SOB, swollen tongue, and chest pain. Provider at bedside and epi given. Took 2 benadryl prior to arrival.

## 2023-07-07 NOTE — Discharge Instructions (Addendum)
You were seen in the Emergency Room today after having allergic reaction.   You have been sent home with Prednisone and Pepcid, please take as prescribed. An Epi-PEN was also sent to your pharmacy for if you start to experience any similar symptoms. Please return to the emergency room if you experience similar symptoms or symptoms worsen.   Please follow up with your primary care provider.

## 2023-07-07 NOTE — ED Provider Notes (Signed)
[facility]    CSN: [id number] Arrival date & time: 07/07/23  1103      History   Chief Complaint Chief Complaint  Patient presents with   Allergic Reaction    HPI Teresa Keller is a 82 y.o. female.   Patient presents to urgent care for evaluation of tongue swelling, shortness of breath, lower lip swelling, and hives that started approximately 20-30 minutes ago after she was stung by what she believes to be a bee. She believes she was stung by a bee on the right hand/wrist but is unsure. Denies history of anaphylaxis or allergic reaction to bee sting. She is unsure of throat closure sensation. She is having slight difficulty swallowing but is able to maintain secretions without difficulty. She took 50mg  benadryl prior to arrival without relief of symptoms. Symptoms rapidly worsening.   Allergic Reaction   Past Medical History:  Diagnosis Date   AKI (acute kidney injury) (HCC)    a. Cr ~1 range in 05/2020, previously 0.6 range.   Arthritis    A. Hip & neck - takes ibuprofen 400mg  bid.   Chest pain    a. 12/2013 ETT: Ex time - 4:00, upsloping ST depression-->low risk. b. Nuc 05/2020 low risk, normal LVEF.   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    Past Surgical History:  Procedure Laterality Date   CHOLECYSTECTOMY  october 2012   ERCP  08/22/2012   Procedure: ENDOSCOPIC RETROGRADE CHOLANGIOPANCREATOGRAPHY (ERCP);  Surgeon: Louis Meckel, MD;  Location: [facility] OR;  Service: Endoscopy;  Laterality: N/A;   ERCP N/A 07/31/2013   Procedure: ENDOSCOPIC RETROGRADE CHOLANGIOPANCREATOGRAPHY (ERCP);  Surgeon: Hilarie Fredrickson, MD;  Location: [facility] ENDOSCOPY;  Service: Endoscopy;  Laterality: N/A;   ESOPHAGOGASTRODUODENOSCOPY  04/25/13   Surveillance at [facility] - Normal appearing ampulla; No residual adenomatous tissue seen   EUS  11/21/2012   Procedure: UPPER ENDOSCOPIC ULTRASOUND (EUS) LINEAR;  Surgeon: Rachael Fee, MD;  Location: [facility] ENDOSCOPY;  Service: Endoscopy;  Laterality: N/A;   HERNIA REPAIR     TUBAL LIGATION     VENTRAL HERNIA REPAIR  08/2011   s/p chole, at trochar site, by Dr. Ezzard Standing    OB History   No obstetric history on file.      Home Medications    Prior to Admission medications   Medication Sig Start Date End Date Taking? Authorizing Provider  albuterol (VENTOLIN HFA) 108 (90 Base) MCG/ACT inhaler Inhale 1-2 puffs into the lungs every 6 (six) hours as needed for wheezing or shortness of breath. 05/14/20   Lurline Idol, FNP  diphenhydrAMINE (BENADRYL) 25 MG tablet Take 1 tablet (25 mg total) by mouth every 6 (six) hours as needed. 07/07/23   Mannie Stabile, PA-C  EPINEPHrine 0.3 mg/0.3 mL IJ SOAJ injection Inject 0.3 mg into the muscle as needed for anaphylaxis. 07/07/23   Mannie Stabile, PA-C  famotidine (PEPCID) 20 MG  tablet Take 1 tablet (20 mg total) by mouth daily for 5 days. 07/07/23 07/12/23  Mannie Stabile, PA-C  ondansetron (ZOFRAN-ODT) 4 MG disintegrating tablet Take 1 tablet (4 mg total) by mouth every 8 (eight) hours as needed for nausea or vomiting. 10/07/22   Mardella Layman, MD  pantoprazole (PROTONIX) 40 MG tablet Take 1 tablet (40 mg total) by mouth 2 (two) times daily. 05/20/20   Regalado, Belkys A, MD  predniSONE (DELTASONE) 20 MG tablet Take 2 tablets (40 mg total) by mouth daily for 5 days. 07/07/23 07/12/23  Mannie Stabile, PA-C  traMADol (ULTRAM) 50 MG tablet Take 1 tablet  (50 mg total) by mouth every 6 (six) hours as needed. 10/07/22   Mardella Layman, MD  gabapentin (NEURONTIN) 100 MG capsule Take 2 capsules (200 mg total) by mouth 3 (three) times daily as needed. Patient not taking: Reported on 10/14/2019 09/08/19 05/03/20  Raeford Razor, MD    Family History Family History  Problem Relation Age of Onset   Hypertension Mother        died @ 54   Colon cancer Mother 10   Alcohol abuse Father        died in his 50's   Hypertension Sister    Diabetes Sister    Diabetes Brother     Social History Social History   Tobacco Use   Smoking status: Never   Smokeless tobacco: Never  Vaping Use   Vaping status: Never Used  Substance Use Topics   Alcohol use: No   Drug use: No     Allergies   Codeine and Elemental sulfur   Review of Systems Review of Systems Per HPI  Physical Exam Triage Vital Signs ED Triage Vitals  Encounter Vitals Group     BP 07/07/23 1111 (!) 161/111     Systolic BP Percentile --      Diastolic BP Percentile --      Pulse Rate 07/07/23 1111 (!) 102     Resp 07/07/23 1111 (!) 28     Temp 07/07/23 1111 98.5 F (36.9 C)     Temp Source 07/07/23 1111 Oral     SpO2 07/07/23 1111 98 %     Weight --      Height --      Head Circumference --      Peak Flow --      Pain Score 07/07/23 1121 6     Pain Loc --      Pain Education --      Exclude from Growth Chart --    No data found.  Updated Vital Signs BP (!) 161/111 (BP Location: Right Arm)   Pulse (!) 102   Temp 98.5 F (36.9 C) (Oral)   Resp (!) 28   SpO2 98%   Visual Acuity Right Eye Distance:   Left Eye Distance:   Bilateral Distance:    Right Eye Near:   Left Eye Near:    Bilateral Near:     Physical Exam Vitals and nursing note reviewed.  Constitutional:      General: She is in acute distress.     Appearance: She is obese. She is ill-appearing. She is not toxic-appearing.  HENT:     Head: Normocephalic and atraumatic.     Right Ear: Hearing  and external ear normal.     Left Ear: Hearing and external ear normal.     Nose: Nose normal. No rhinorrhea.     Mouth/Throat:  Lips: Pink.     Mouth: Mucous membranes are moist. No injury.     Tongue: No lesions. Tongue does not deviate from midline.     Palate: No mass and lesions.     Pharynx: Uvula midline. Pharyngeal swelling present. No oropharyngeal exudate, posterior oropharyngeal erythema or uvula swelling.     Tonsils: No tonsillar exudate or tonsillar abscesses.     Comments: Tongue swelling and left lower lip swelling present. Posterior oropharyngeal swelling and erythema. Maintaining secretions without difficulty/drool. No trismus. Eyes:     General: Lids are normal. Vision grossly intact. Gaze aligned appropriately.     Extraocular Movements: Extraocular movements intact.     Conjunctiva/sclera: Conjunctivae normal.  Cardiovascular:     Rate and Rhythm: Normal rate and regular rhythm.     Heart sounds: Normal heart sounds, S1 normal and S2 normal.  Pulmonary:     Effort: Tachypnea and respiratory distress present.     Breath sounds: Normal breath sounds and air entry. No stridor. No wheezing, rhonchi or rales.     Comments: Breath sounds normal, tachypnea. Slight difficulty speaking in full sentences.  Chest:     Chest wall: No tenderness.  Musculoskeletal:     Cervical back: Neck supple.     Right lower leg: No edema.     Left lower leg: No edema.  Skin:    General: Skin is warm and dry.     Capillary Refill: Capillary refill takes less than 2 seconds.     Findings: Rash (Diffuse hives to the left neck soft tissue) present.  Neurological:     General: No focal deficit present.     Mental Status: She is alert and oriented to person, place, and time. Mental status is at baseline.     Cranial Nerves: No dysarthria or facial asymmetry.     Comments: Neurologically intact to baseline.  Psychiatric:        Mood and Affect: Mood normal.        Speech: Speech normal.         Behavior: Behavior normal.        Thought Content: Thought content normal.        Judgment: Judgment normal.      UC Treatments / Results  Labs (all labs ordered are listed, but only abnormal results are displayed) Labs Reviewed - No data to display  EKG   Radiology  Procedures Procedures (including critical care time)  Medications Ordered in UC Medications  EPINEPHrine (EPI-PEN) injection 0.3 mg (0.3 mg Intramuscular Given 07/07/23 1142)  methylPREDNISolone sodium succinate (SOLU-MEDROL) 125 mg/2 mL injection 125 mg (125 mg Intravenous Given 07/07/23 1143)  sodium chloride 0.9 % bolus 250 mL (250 mLs Intravenous New Bag/Given 07/07/23 1143)    Initial Impression / Assessment and Plan / UC Course  I have reviewed the triage vital signs and the nursing notes.  Pertinent labs & imaging results that were available during my care of the patient were reviewed by me and considered in my medical decision making (see chart for details).   1. Anaphylactic reaction to bee sting, accidental or unintentional initial encounter Patient presents with symptoms consistent with anaphylactic shock. Initially hypertensive, tachycardic, and in acute respiratory distress.  2L nasal cannula placed, patient given EpiPen by provider, [facility] called to transport patient to ED. After EpiPen administration, she complains of 6/10 chest pain that improved slowly after about 10 minutes. Symptoms continued to improve after EpiPen administration, heart rate reduced to the upper  80s, respiratory rate improved to 22, and BP improved to 130s/80s.  Facial/tongue swelling improved, shortness of breath significantly improved. IV placed, 125mg  solumedrol IV given. EKG shows normal sinus rhythm with nonspecific ST/T wave changes.  Patient discharged from UC to ED via [facility] for cardiac monitoring after EpiPen usage.  Counseled patient on potential for adverse effects with medications prescribed/recommended  today, strict ER and return-to-clinic precautions discussed, patient verbalized understanding.    Final Clinical Impressions(s) / UC Diagnoses   Final diagnoses:  Anaphylactic reaction to bee sting, accidental or unintentional, initial encounter   Discharge Instructions   None    ED Prescriptions   None    PDMP not reviewed this encounter.   Carlisle Beers, [place] 07/07/23 2159

## 2023-07-07 NOTE — ED Triage Notes (Signed)
PT BIB EMS from urgent care for a anaphylaxis reaction to a bee sting.  They administered 2 epi pens, solumedrol, and benadryl. Then sent here for evaluation.  Pt is now back to back baseline.   20 R wrist 160/80 HR 100  SR  GCS 15 Pain 0 2L [place] 98%

## 2023-07-07 NOTE — ED Provider Notes (Signed)
  Physical Exam  BP 132/64   Pulse 81   Temp 98.6 F (37 C) (Oral)   Resp 20   SpO2 100%     Procedures  Procedures  ED Course / MDM   Clinical Course as of 07/07/23 1620  Wed Jul 07, 2023  1242 Reassessed patient at bedside. No signs of respiratory distress. No long has a rash. Lungs clear to auscultation bilaterally without stridor or wheeze. Airway patent. [CA]  1255 Reassessed. Patient continues to have only mild chest pain, otherwise her symptoms have not worsened or returned. Pt and daughter educated on plan to observe in ER x4hrs, they agree and understand.  [JB]  1323 Reassessed, pt cont to have mild chest pain. No sign of respiratory distress, no new or worsening sx.  Lungs clear bilaterally w/ no stridor or wheeze. Airway patent [JB]  1501 Reassessed, pt reports no CP, SOB. Lungs clear to auscultation b/l. No sign of respiratory distress.  [JB]  1513 Troponin I (High Sensitivity) [JB]    Clinical Course User Index [CA] Mannie Stabile, PA-C [JB] Barrett, Horald Chestnut, PA-C   Medical Decision Making Amount and/or Complexity of Data Reviewed Labs: ordered. Decision-making details documented in ED Course. Radiology: ordered.  Hand-off at end of shift by Jasmine Pang, PA-C. Seen for allergic reaction after bee sting, c/o chest pain in the ED. Negative cardiac work up (trop x 2 neg), now pain free. Treated with epinephrine, benadryl, prednisone  with resolution of allergic symptoms.   On recheck, patient remains asymptomatic, comfortable. VSS. Will discharge per plan of previous treatment team.   All questions answered. Patient is comfortable with discharge.        Elpidio Anis, PA-C 07/07/23 1623    Alvira Monday, MD 07/08/23 1200

## 2023-09-03 ENCOUNTER — Other Ambulatory Visit: Payer: Self-pay | Admitting: Internal Medicine

## 2023-09-03 DIAGNOSIS — Z1231 Encounter for screening mammogram for malignant neoplasm of breast: Secondary | ICD-10-CM

## 2023-09-29 ENCOUNTER — Ambulatory Visit
Admission: RE | Admit: 2023-09-29 | Discharge: 2023-09-29 | Disposition: A | Discharge status: Home / Self Care | Payer: Medicare HMO | Source: Ambulatory Visit | Attending: Internal Medicine | Admitting: Internal Medicine

## 2023-09-29 DIAGNOSIS — Z1231 Encounter for screening mammogram for malignant neoplasm of breast: Secondary | ICD-10-CM
